# Patient Record
Sex: Female | Born: 1975 | Race: White | Hispanic: No | Marital: Married | Smoking: Never smoker
Health system: Southern US, Community
[De-identification: ages and names within clinical notes are randomized; demographics above are authoritative.]

## PROBLEM LIST (undated history)

## (undated) DIAGNOSIS — D649 Anemia, unspecified: Secondary | ICD-10-CM

## (undated) DIAGNOSIS — T4145XA Adverse effect of unspecified anesthetic, initial encounter: Secondary | ICD-10-CM

## (undated) DIAGNOSIS — R51 Headache: Secondary | ICD-10-CM

## (undated) DIAGNOSIS — Z789 Other specified health status: Secondary | ICD-10-CM

## (undated) DIAGNOSIS — R112 Nausea with vomiting, unspecified: Secondary | ICD-10-CM

## (undated) DIAGNOSIS — F319 Bipolar disorder, unspecified: Secondary | ICD-10-CM

## (undated) DIAGNOSIS — J189 Pneumonia, unspecified organism: Secondary | ICD-10-CM

## (undated) DIAGNOSIS — Z8489 Family history of other specified conditions: Secondary | ICD-10-CM

## (undated) DIAGNOSIS — E079 Disorder of thyroid, unspecified: Secondary | ICD-10-CM

## (undated) DIAGNOSIS — Z862 Personal history of diseases of the blood and blood-forming organs and certain disorders involving the immune mechanism: Secondary | ICD-10-CM

## (undated) DIAGNOSIS — T8859XA Other complications of anesthesia, initial encounter: Secondary | ICD-10-CM

## (undated) DIAGNOSIS — F32A Depression, unspecified: Secondary | ICD-10-CM

## (undated) DIAGNOSIS — T7840XA Allergy, unspecified, initial encounter: Secondary | ICD-10-CM

## (undated) DIAGNOSIS — K219 Gastro-esophageal reflux disease without esophagitis: Secondary | ICD-10-CM

## (undated) DIAGNOSIS — R0600 Dyspnea, unspecified: Secondary | ICD-10-CM

## (undated) DIAGNOSIS — Z9889 Other specified postprocedural states: Secondary | ICD-10-CM

## (undated) DIAGNOSIS — F419 Anxiety disorder, unspecified: Secondary | ICD-10-CM

## (undated) DIAGNOSIS — M5430 Sciatica, unspecified side: Secondary | ICD-10-CM

## (undated) DIAGNOSIS — E039 Hypothyroidism, unspecified: Secondary | ICD-10-CM

## (undated) DIAGNOSIS — G43909 Migraine, unspecified, not intractable, without status migrainosus: Secondary | ICD-10-CM

## (undated) DIAGNOSIS — B019 Varicella without complication: Secondary | ICD-10-CM

## (undated) HISTORY — PX: UPPER GI ENDOSCOPY: SHX6162

## (undated) HISTORY — DX: Allergy, unspecified, initial encounter: T78.40XA

## (undated) HISTORY — DX: Headache: R51

## (undated) HISTORY — PX: OTHER SURGICAL HISTORY: SHX169

## (undated) HISTORY — DX: Depression, unspecified: F32.A

## (undated) HISTORY — DX: Varicella without complication: B01.9

## (undated) HISTORY — PX: WISDOM TOOTH EXTRACTION: SHX21

## (undated) HISTORY — DX: Other specified health status: Z78.9

## (undated) HISTORY — PX: BACK SURGERY: SHX140

## (undated) HISTORY — PX: COLONOSCOPY: SHX174

## (undated) HISTORY — DX: Bipolar disorder, unspecified: F31.9

## (undated) HISTORY — DX: Migraine, unspecified, not intractable, without status migrainosus: G43.909

---
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
## 1998-04-07 ENCOUNTER — Encounter: Payer: Self-pay | Admitting: Obstetrics and Gynecology

## 1998-04-07 ENCOUNTER — Ambulatory Visit (HOSPITAL_COMMUNITY): Admission: RE | Admit: 1998-04-07 | Discharge: 1998-04-07 | Payer: Self-pay | Admitting: Obstetrics and Gynecology

## 1998-05-18 ENCOUNTER — Ambulatory Visit (HOSPITAL_COMMUNITY): Admission: RE | Admit: 1998-05-18 | Discharge: 1998-05-18 | Payer: Self-pay | Admitting: Obstetrics and Gynecology

## 1998-07-05 ENCOUNTER — Inpatient Hospital Stay (HOSPITAL_COMMUNITY): Admission: AD | Admit: 1998-07-05 | Discharge: 1998-07-05 | Payer: Self-pay | Admitting: Obstetrics and Gynecology

## 1998-07-31 ENCOUNTER — Inpatient Hospital Stay (HOSPITAL_COMMUNITY): Admission: AD | Admit: 1998-07-31 | Discharge: 1998-07-31 | Payer: Self-pay | Admitting: Obstetrics and Gynecology

## 1998-08-12 ENCOUNTER — Inpatient Hospital Stay (HOSPITAL_COMMUNITY): Admission: AD | Admit: 1998-08-12 | Discharge: 1998-08-12 | Payer: Self-pay | Admitting: Obstetrics and Gynecology

## 1998-08-19 ENCOUNTER — Inpatient Hospital Stay (HOSPITAL_COMMUNITY): Admission: AD | Admit: 1998-08-19 | Discharge: 1998-08-19 | Payer: Self-pay | Admitting: Obstetrics and Gynecology

## 1998-08-21 ENCOUNTER — Inpatient Hospital Stay (HOSPITAL_COMMUNITY): Admission: AD | Admit: 1998-08-21 | Discharge: 1998-08-25 | Payer: Self-pay | Admitting: *Deleted

## 1999-11-06 ENCOUNTER — Other Ambulatory Visit: Admission: RE | Admit: 1999-11-06 | Discharge: 1999-11-06 | Payer: Self-pay | Admitting: Obstetrics and Gynecology

## 2000-06-11 ENCOUNTER — Ambulatory Visit (HOSPITAL_COMMUNITY): Admission: RE | Admit: 2000-06-11 | Discharge: 2000-06-11 | Payer: Self-pay | Admitting: Obstetrics and Gynecology

## 2000-06-11 ENCOUNTER — Encounter: Payer: Self-pay | Admitting: Obstetrics and Gynecology

## 2000-07-09 ENCOUNTER — Encounter: Payer: Self-pay | Admitting: Obstetrics and Gynecology

## 2000-07-09 ENCOUNTER — Ambulatory Visit (HOSPITAL_COMMUNITY): Admission: RE | Admit: 2000-07-09 | Discharge: 2000-07-09 | Payer: Self-pay | Admitting: Obstetrics and Gynecology

## 2000-11-02 ENCOUNTER — Inpatient Hospital Stay (HOSPITAL_COMMUNITY): Admission: AD | Admit: 2000-11-02 | Discharge: 2000-11-05 | Payer: Self-pay | Admitting: Obstetrics and Gynecology

## 2000-12-15 ENCOUNTER — Other Ambulatory Visit: Admission: RE | Admit: 2000-12-15 | Discharge: 2000-12-15 | Payer: Self-pay | Admitting: Obstetrics and Gynecology

## 2002-12-20 ENCOUNTER — Other Ambulatory Visit: Admission: RE | Admit: 2002-12-20 | Discharge: 2002-12-20 | Payer: Self-pay | Admitting: Obstetrics and Gynecology

## 2003-11-17 ENCOUNTER — Inpatient Hospital Stay (HOSPITAL_COMMUNITY): Admission: RE | Admit: 2003-11-17 | Discharge: 2003-11-19 | Payer: Self-pay | Admitting: Obstetrics and Gynecology

## 2003-12-27 ENCOUNTER — Other Ambulatory Visit: Admission: RE | Admit: 2003-12-27 | Discharge: 2003-12-27 | Payer: Self-pay | Admitting: Obstetrics and Gynecology

## 2005-01-21 ENCOUNTER — Other Ambulatory Visit: Admission: RE | Admit: 2005-01-21 | Discharge: 2005-01-21 | Payer: Self-pay | Admitting: Obstetrics and Gynecology

## 2006-01-22 ENCOUNTER — Other Ambulatory Visit: Admission: RE | Admit: 2006-01-22 | Discharge: 2006-01-22 | Payer: Self-pay | Admitting: Obstetrics and Gynecology

## 2011-11-21 ENCOUNTER — Other Ambulatory Visit: Payer: Self-pay | Admitting: Family Medicine

## 2011-11-21 DIAGNOSIS — J329 Chronic sinusitis, unspecified: Secondary | ICD-10-CM

## 2011-12-02 ENCOUNTER — Inpatient Hospital Stay: Admission: RE | Admit: 2011-12-02 | Payer: Self-pay | Source: Ambulatory Visit

## 2014-01-24 ENCOUNTER — Ambulatory Visit (INDEPENDENT_AMBULATORY_CARE_PROVIDER_SITE_OTHER): Payer: BC Managed Care – PPO | Admitting: Neurology

## 2014-01-24 ENCOUNTER — Encounter: Payer: Self-pay | Admitting: Neurology

## 2014-01-24 ENCOUNTER — Ambulatory Visit: Payer: Self-pay | Admitting: Neurology

## 2014-01-24 VITALS — BP 118/74 | HR 70 | Temp 99.1°F | Resp 16 | Ht 64.5 in | Wt 183.5 lb

## 2014-01-24 DIAGNOSIS — G43839 Menstrual migraine, intractable, without status migrainosus: Secondary | ICD-10-CM

## 2014-01-24 MED ORDER — TOPIRAMATE 25 MG PO TABS: 25.0000 mg | ORAL_TABLET | Freq: Every day | ORAL | Status: DC

## 2014-01-24 NOTE — Patient Instructions (Signed)
Migraine Recommendations: 1.  Take Relpax 40mg  at immediate onset of migraine.  May repeat once in 2 hours (do not exceed 2 pills in 24 hours) 2.  Limit use of pain relievers to no more than 2 days out of the week.  These medications include acetaminophen, ibuprofen, triptans and narcotics.  This will help reduce risk of rebound headaches. 3.  Keep a headache diary. 4.  Stay adequately hydrated. 5.  Maintain good sleep hygiene. 6.  Maintain proper stress management. 7.  Start topamax 25mg  at bedtime.  Possible side effects include: impaired thinking, sedation, paresthesias (numbness and tingling) and weight loss.  It may cause dehydration and there is a small risk for kidney stones, so make sure to stay hydrated with water during the day.  There is also a very small risk for glaucoma, so if you notice any change in your vision while taking this medication, see an ophthalmologist.   8.  Call in 4 weeks with update.  Follow up in 3 months.

## 2014-01-24 NOTE — Progress Notes (Signed)
NEUROLOGY CONSULTATION NOTE  Sheila Mathews MRN: [id number] DOB: 12-05-1975  Referring provider: Dr. Hyman Hopes Primary care provider: Dr. Hyman Hopes  Reason for consult:  Migraine  HISTORY OF PRESENT ILLNESS: Sheila Mathews is a 38 year old right-handed woman history of migraines, panic attacks, anxiety and allergic rhinitis who presents for migraine.  Onset:  38 years old Location:  Usually right-sided, retro-orbital, and moves to the top of the head Quality:  Usually pressure-like, sometimes stabbing Intensity:  Usually 6-7/10, 10 out of 10 for severe Aura:  No Prodrome:  No Associated symptoms:  First severe, experiences nausea, blurred vision, photophobia, phonophobia, and osmophobia. Duration:  Severe attacks last up to 4 days. Otherwise 2-3 hours Frequency:  Severe attacks occur once a month (4 days per month), otherwise other headaches occur 2 days per week. Total of 15-18 headache days per month. Triggers/exacerbating factors:  Menstrual cycle Relieving factors:  Phenergan/Toradol shots Activity:  Cannot function with severe attacks  Past abortive therapy:  Maxalt 10mg , Imitrex 50mg  (ineffective), ketorolac tromethamine 60mg , Excedrin (ineffective), ibuprofen and naproxen (made headaches worse) Past preventative therapy:  amitriptyline 10mg  (side effects), Depakote (did not tolerate),  Not started on propranolol due to HR in 60s  Current abortive therapy:  Relpax 40mg  (so far it helps) Current preventative therapy:  None Other medications:  alprazolam 0.25mg , citalopram 40mg , omeprazole 20mg   Caffeine:  1 cup of coffee per day Alcohol:  Occasionally Smoker:  No Diet:  8 healthy. Drinks a lot of water. Exercise:  Good Depression/stress:  Anxiety controlled Sleep hygiene:  Wakes up during the night and difficulty falling back asleep Family history of headache:  No  PAST MEDICAL HISTORY: Past Medical History  Diagnosis Date  . Headache(784.0)     PAST SURGICAL  HISTORY: No past surgical history on file.  MEDICATIONS: No current outpatient prescriptions on file prior to visit.   No current facility-administered medications on file prior to visit.    ALLERGIES: Allergies  Allergen Reactions  . Codeine     Makes patient aggressive     FAMILY HISTORY: Family History  Problem Relation Age of Onset  . Cancer Maternal Grandmother     breast   . Cancer Maternal Grandfather     head neck   . Cancer Paternal Grandmother     gallbladder  . Cancer Paternal Grandfather     colon   . Diabetes Paternal Grandfather   . Diabetes Maternal Grandfather     SOCIAL HISTORY: History   Social History  . Marital Status: Married    Spouse Name: N/A    Number of Children: N/A  . Years of Education: N/A   Occupational History  . Not on file.   Social History Main Topics  . Smoking status: Not on file  . Smokeless tobacco: Not on file  . Alcohol Use: Yes     Comment: social  . Drug Use: No  . Sexual Activity: Yes    Partners: Male   Other Topics Concern  . Not on file   Social History Narrative  . No narrative on file    REVIEW OF SYSTEMS: Constitutional: No fevers, chills, or sweats, no generalized fatigue, change in appetite Eyes: No visual changes, double vision, eye pain Ear, nose and throat: No hearing loss, ear pain, nasal congestion, sore throat Cardiovascular: No chest pain, palpitations Respiratory:  No shortness of breath at rest or with exertion, wheezes GastrointestinaI: No nausea, vomiting, diarrhea, abdominal pain, fecal incontinence Genitourinary:  No dysuria, urinary  retention or frequency Musculoskeletal:  No neck pain, back pain Integumentary: No rash, pruritus, skin lesions Neurological: as above Psychiatric: No depression, insomnia, anxiety Endocrine: No palpitations, fatigue, diaphoresis, mood swings, change in appetite, change in weight, increased thirst Hematologic/Lymphatic:  No anemia, purpura,  petechiae. Allergic/Immunologic: no itchy/runny eyes, nasal congestion, recent allergic reactions, rashes  PHYSICAL EXAM: Filed Vitals:   01/24/14 1009  BP: 118/74  Pulse: 70  Temp: 99.1 F (37.3 C)  Resp: 16   General: No acute distress Head:  Normocephalic/atraumatic Neck: supple, no paraspinal tenderness, full range of motion Back: No paraspinal tenderness Heart: regular rate and rhythm Lungs: Clear to auscultation bilaterally. Vascular: No carotid bruits. Neurological Exam: Mental status: alert and oriented to person, place, and time, recent and remote memory intact, fund of knowledge intact, attention and concentration intact, speech fluent and not dysarthric, language intact. Cranial nerves: CN I: not tested CN II: pupils equal, round and reactive to light, visual fields intact, fundi unremarkable, without vessel changes, exudates, hemorrhages or papilledema. CN III, IV, VI:  full range of motion, no nystagmus, no ptosis CN V: facial sensation intact CN VII: upper and lower face symmetric CN VIII: hearing intact CN IX, X: gag intact, uvula midline CN XI: sternocleidomastoid and trapezius muscles intact CN XII: tongue midline Bulk & Tone: normal, no fasciculations. Motor: 5 out of 5 throughout Sensation: Temperature and vibration intact Deep Tendon Reflexes: 2+ throughout, toes downgoing Finger to nose testing: No dysmetria Heel to shin: No dysmetria Gait: Normal station and stride. Able to turn and walk in tandem. Romberg negative.  IMPRESSION: Menstrually related migraines  PLAN: 1.  start Topamax 25 mg at bedtime. Side effects discussed. Instructed to call in 4 weeks with update and we can adjust dose as needed. 2. For abortive therapy, continue Relpax 40 mg. 3. Followup in 3 months. Other options are to consider a mini prophylaxis during her cycle.  Thank you for allowing me to take part in the care of this patient.  Shon MilletAdam Jaffe, DO  CC: Shirlean Mylararol Webb,  MD

## 2014-05-02 ENCOUNTER — Telehealth: Payer: Self-pay | Admitting: Neurology

## 2014-05-02 NOTE — Telephone Encounter (Signed)
Pt canceled her f/u appt on 05/04/14 due to her having a meeting after work. Pt will call later to r/s.

## 2014-05-04 ENCOUNTER — Ambulatory Visit: Payer: BC Managed Care – PPO | Admitting: Neurology

## 2014-05-04 ENCOUNTER — Encounter (HOSPITAL_COMMUNITY): Payer: Self-pay

## 2014-05-04 ENCOUNTER — Encounter (HOSPITAL_COMMUNITY)
Admission: RE | Admit: 2014-05-04 | Discharge: 2014-05-04 | Disposition: A | Discharge status: Home / Self Care | Payer: BC Managed Care – PPO | Source: Ambulatory Visit | Attending: Obstetrics and Gynecology | Admitting: Obstetrics and Gynecology

## 2014-05-04 HISTORY — DX: Gastro-esophageal reflux disease without esophagitis: K21.9

## 2014-05-04 HISTORY — DX: Pneumonia, unspecified organism: J18.9

## 2014-05-04 HISTORY — DX: Anxiety disorder, unspecified: F41.9

## 2014-05-04 HISTORY — DX: Other complications of anesthesia, initial encounter: T88.59XA

## 2014-05-04 HISTORY — DX: Adverse effect of unspecified anesthetic, initial encounter: T41.45XA

## 2014-05-04 LAB — CBC
HCT: 36.2 % (ref 36.0–46.0)
Hemoglobin: 12.5 g/dL (ref 12.0–15.0)
MCH: 32 pg (ref 26.0–34.0)
MCHC: 34.5 g/dL (ref 30.0–36.0)
MCV: 92.6 fL (ref 78.0–100.0)
PLATELETS: 315 10*3/uL (ref 150–400)
RBC: 3.91 MIL/uL (ref 3.87–5.11)
RDW: 13.3 % (ref 11.5–15.5)
WBC: 10.1 10*3/uL (ref 4.0–10.5)

## 2014-05-04 NOTE — Patient Instructions (Addendum)
Your procedure is scheduled on:  Friday, NOV. 13, 2015  Enter through the Main Entrance of [facility] at: 6:00 A.M. Pick up the phone at the desk and dial [phone].  Call this number if you have problems the morning of surgery: [phone].  Remember: Do NOT eat food:  AFTER MIDNIGHT THURSDAY Do NOT drink clear liquids after: AFTER MIDNIGHT THURSDAY Take these medicines the morning of surgery with a SIP OF WATER: Prilosec  Do NOT wear jewelry (body piercing), metal hair clips/bobby pins, make-up, or nail polish. Do NOT wear lotions, powders, or perfumes.  You may wear deoderant. Do NOT shave for 48 hours prior to surgery. Do NOT bring valuables to the hospital. Contacts, dentures, or bridgework may not be worn into surgery.  Have a responsible adult drive you home and stay with you for 24 hours after your procedure

## 2014-05-12 ENCOUNTER — Encounter (HOSPITAL_COMMUNITY): Payer: Self-pay | Admitting: Anesthesiology

## 2014-05-12 NOTE — H&P (Signed)
Sheila Mathews is an 38 y.o. female. For the past 1-2 months has had constant, dull, bilateral low abdominal/pelvic pain.  At it's worse it is 7/10 intensity.  Pain is worse with menses and with coitus, better when has BM and with NSAIDs.  She is having regular meses monthly with OCP.  Pelvic ultrasound is normal.  Medical and surgical options have been discussed, she wishes to proceed with laparoscopy.    Pertinent Gynecological History: Last pap: abnormal: Normal with +HPV, normal colposcopy Date: 07/2013 OB History: G3, P3003 C-section, then VBAC, then c-section   Menstrual History: No LMP recorded.    Past Medical History  Diagnosis Date  . Headache(784.0)   . Pneumonia   . Anxiety   . GERD (gastroesophageal reflux disease)   . Complication of anesthesia     pt doesn't like mask she has panic attack    Past Surgical History  Procedure Laterality Date  . Cesarean section    . Saliva gland removal    . Upper gi endoscopy    . Wisdom tooth extraction      Family History  Problem Relation Age of Onset  . Cancer Maternal Grandmother     breast   . Cancer Maternal Grandfather     head neck   . Cancer Paternal Grandmother     gallbladder  . Cancer Paternal Grandfather     colon   . Diabetes Paternal Grandfather   . Diabetes Maternal Grandfather     Social History:  reports that she has never smoked. She has never used smokeless tobacco. She reports that she drinks alcohol. She reports that she does not use illicit drugs.  Allergies:  Allergies  Allergen Reactions  . Codeine     Makes patient aggressive     No prescriptions prior to admission    Review of Systems  Respiratory: Negative.   Cardiovascular: Negative.   Gastrointestinal: Positive for diarrhea and constipation.  Genitourinary: Positive for urgency and frequency.    There were no vitals taken for this visit. Physical Exam  Constitutional: She appears well-developed and well-nourished.  Neck:  Neck supple. No thyromegaly present.  Cardiovascular: Normal rate, regular rhythm and normal heart sounds.   No murmur heard. Respiratory: Effort normal and breath sounds normal. No respiratory distress. She has no wheezes.  GI: Soft. She exhibits no distension and no mass. Tenderness: mild BLQ   Genitourinary:  Tender left vaginal fornix Uterus normal size, non-tender No adnexal mass, bilateral adnexal tenderness    No results found for this or any previous visit (from the past 24 hour(s)).  No results found.  Assessment/Plan: Increasing pelvic pain.  Medical and surgical options discussed, she wishes to proceed with laparoscopy.  Surgical procedure and risks, as well as chances of finding a cause and relieving pain have all been discussed.  Will proceed with diagnostic/possible operative laparoscopy.  Tylah Mancillas D 05/12/2014, 8:13 PM

## 2014-05-12 NOTE — Anesthesia Preprocedure Evaluation (Addendum)
Anesthesia Evaluation  Patient identified by MRN, date of birth, ID band Patient awake    Reviewed: Allergy & Precautions, H&P , NPO status , Patient's Chart, lab work & pertinent test results  History of Anesthesia Complications (+) history of anesthetic complications  Airway Mallampati: II  TM Distance: >3 FB Neck ROM: Full    Dental no notable dental hx. (+) Teeth Intact   Pulmonary pneumonia -, resolved,  breath sounds clear to auscultation  Pulmonary exam normal       Cardiovascular negative cardio ROS  Rhythm:Regular Rate:Normal     Neuro/Psych  Headaches, PSYCHIATRIC DISORDERS Anxiety    GI/Hepatic Neg liver ROS, GERD-  Medicated and Controlled,  Endo/Other  Obesity  Renal/GU   negative genitourinary   Musculoskeletal negative musculoskeletal ROS (+)   Abdominal (+) + obese,   Peds  Hematology negative hematology ROS (+)   Anesthesia Other Findings   Reproductive/Obstetrics Pelvic pain                             Anesthesia Physical Anesthesia Plan  ASA: II  Anesthesia Plan: General   Post-op Pain Management:    Induction: Intravenous and Cricoid pressure planned  Airway Management Planned: Oral ETT  Additional Equipment:   Intra-op Plan:   Post-operative Plan: Extubation in OR  Informed Consent: I have reviewed the patients History and Physical, chart, labs and discussed the procedure including the risks, benefits and alternatives for the proposed anesthesia with the patient or authorized representative who has indicated his/her understanding and acceptance.   Dental advisory given  Plan Discussed with: CRNA, Anesthesiologist and Surgeon  Anesthesia Plan Comments:         Anesthesia Quick Evaluation

## 2014-05-13 ENCOUNTER — Ambulatory Visit (HOSPITAL_COMMUNITY): Payer: BC Managed Care – PPO | Admitting: Anesthesiology

## 2014-05-13 ENCOUNTER — Encounter (HOSPITAL_COMMUNITY): Payer: Self-pay | Admitting: Anesthesiology

## 2014-05-13 ENCOUNTER — Ambulatory Visit (HOSPITAL_COMMUNITY)
Admission: RE | Admit: 2014-05-13 | Discharge: 2014-05-13 | Disposition: A | Discharge status: Home / Self Care | Payer: BC Managed Care – PPO | Source: Ambulatory Visit | Attending: Obstetrics and Gynecology | Admitting: Obstetrics and Gynecology

## 2014-05-13 ENCOUNTER — Encounter (HOSPITAL_COMMUNITY)
Admission: RE | Disposition: A | Discharge status: Home / Self Care | Payer: Self-pay | Source: Ambulatory Visit | Attending: Obstetrics and Gynecology

## 2014-05-13 DIAGNOSIS — Z6830 Body mass index (BMI) 30.0-30.9, adult: Secondary | ICD-10-CM | POA: Insufficient documentation

## 2014-05-13 DIAGNOSIS — K219 Gastro-esophageal reflux disease without esophagitis: Secondary | ICD-10-CM | POA: Diagnosis not present

## 2014-05-13 DIAGNOSIS — E669 Obesity, unspecified: Secondary | ICD-10-CM | POA: Diagnosis not present

## 2014-05-13 DIAGNOSIS — R102 Pelvic and perineal pain: Secondary | ICD-10-CM

## 2014-05-13 DIAGNOSIS — N949 Unspecified condition associated with female genital organs and menstrual cycle: Secondary | ICD-10-CM | POA: Insufficient documentation

## 2014-05-13 HISTORY — DX: Pelvic and perineal pain: R10.2

## 2014-05-13 HISTORY — PX: LAPAROSCOPY: SHX197

## 2014-05-13 LAB — PREGNANCY, URINE: PREG TEST UR: NEGATIVE

## 2014-05-13 SURGERY — LAPAROSCOPY, DIAGNOSTIC
Anesthesia: General

## 2014-05-13 MED ORDER — METOCLOPRAMIDE HCL 5 MG/ML IJ SOLN: 10.0000 mg | Freq: Once | INTRAMUSCULAR | Status: DC | PRN

## 2014-05-13 MED ORDER — SCOPOLAMINE 1 MG/3DAYS TD PT72
1.0000 | MEDICATED_PATCH | TRANSDERMAL | Status: DC
  Administered 2014-05-13: 1.5 mg via TRANSDERMAL

## 2014-05-13 MED ORDER — ROCURONIUM BROMIDE 100 MG/10ML IV SOLN
INTRAVENOUS | Status: DC | PRN
  Administered 2014-05-13: 30 mg via INTRAVENOUS

## 2014-05-13 MED ORDER — MIDAZOLAM HCL 2 MG/2ML IJ SOLN
INTRAMUSCULAR | Status: AC
  Filled 2014-05-13: qty 2

## 2014-05-13 MED ORDER — GLYCOPYRROLATE 0.2 MG/ML IJ SOLN
INTRAMUSCULAR | Status: DC | PRN
  Administered 2014-05-13: 0.4 mg via INTRAVENOUS

## 2014-05-13 MED ORDER — PROPOFOL 10 MG/ML IV BOLUS
INTRAVENOUS | Status: DC | PRN
  Administered 2014-05-13: 180 mg via INTRAVENOUS

## 2014-05-13 MED ORDER — SCOPOLAMINE 1 MG/3DAYS TD PT72: 1.0000 | MEDICATED_PATCH | Freq: Once | TRANSDERMAL | Status: DC

## 2014-05-13 MED ORDER — DEXAMETHASONE SODIUM PHOSPHATE 10 MG/ML IJ SOLN
INTRAMUSCULAR | Status: DC | PRN
  Administered 2014-05-13: 4 mg via INTRAVENOUS

## 2014-05-13 MED ORDER — ROCURONIUM BROMIDE 100 MG/10ML IV SOLN
INTRAVENOUS | Status: AC
  Filled 2014-05-13: qty 1

## 2014-05-13 MED ORDER — LIDOCAINE HCL (CARDIAC) 20 MG/ML IV SOLN
INTRAVENOUS | Status: DC | PRN
  Administered 2014-05-13: 20 mg via INTRAVENOUS
  Administered 2014-05-13: 80 mg via INTRAVENOUS

## 2014-05-13 MED ORDER — OXYCODONE-ACETAMINOPHEN 5-325 MG PO TABS: 1.0000 | ORAL_TABLET | ORAL | Status: DC | PRN

## 2014-05-13 MED ORDER — KETOROLAC TROMETHAMINE 30 MG/ML IJ SOLN
INTRAMUSCULAR | Status: AC
  Filled 2014-05-13: qty 1

## 2014-05-13 MED ORDER — MIDAZOLAM HCL 2 MG/2ML IJ SOLN
INTRAMUSCULAR | Status: DC | PRN
  Administered 2014-05-13: 1 mg via INTRAVENOUS

## 2014-05-13 MED ORDER — OXYCODONE-ACETAMINOPHEN 5-325 MG PO TABS
ORAL_TABLET | ORAL | Status: AC
  Administered 2014-05-13: 1
  Filled 2014-05-13: qty 1

## 2014-05-13 MED ORDER — PROPOFOL 10 MG/ML IV EMUL
INTRAVENOUS | Status: AC
  Filled 2014-05-13: qty 20

## 2014-05-13 MED ORDER — NEOSTIGMINE METHYLSULFATE 10 MG/10ML IV SOLN
INTRAVENOUS | Status: DC | PRN
  Administered 2014-05-13: 3 mg via INTRAVENOUS

## 2014-05-13 MED ORDER — HYDROCODONE-ACETAMINOPHEN 5-325 MG PO TABS
1.0000 | ORAL_TABLET | ORAL | Status: DC | PRN
Start: 2014-05-13 — End: 2014-05-13

## 2014-05-13 MED ORDER — FENTANYL CITRATE 0.05 MG/ML IJ SOLN
25.0000 ug | INTRAMUSCULAR | Status: DC | PRN
  Administered 2014-05-13 (×2): 50 ug via INTRAVENOUS

## 2014-05-13 MED ORDER — LACTATED RINGERS IV SOLN
INTRAVENOUS | Status: DC
  Administered 2014-05-13 (×2): via INTRAVENOUS

## 2014-05-13 MED ORDER — ONDANSETRON HCL 4 MG/2ML IJ SOLN
INTRAMUSCULAR | Status: AC
  Filled 2014-05-13: qty 2

## 2014-05-13 MED ORDER — KETOROLAC TROMETHAMINE 30 MG/ML IJ SOLN
INTRAMUSCULAR | Status: DC | PRN
  Administered 2014-05-13: 30 mg via INTRAVENOUS

## 2014-05-13 MED ORDER — FENTANYL CITRATE 0.05 MG/ML IJ SOLN
INTRAMUSCULAR | Status: AC
  Administered 2014-05-13: 50 ug via INTRAVENOUS
  Filled 2014-05-13: qty 2

## 2014-05-13 MED ORDER — LIDOCAINE HCL (CARDIAC) 20 MG/ML IV SOLN
INTRAVENOUS | Status: AC
  Filled 2014-05-13: qty 5

## 2014-05-13 MED ORDER — DEXAMETHASONE SODIUM PHOSPHATE 4 MG/ML IJ SOLN
INTRAMUSCULAR | Status: AC
  Filled 2014-05-13: qty 1

## 2014-05-13 MED ORDER — SODIUM CHLORIDE 0.9 % IJ SOLN
INTRAMUSCULAR | Status: DC | PRN
  Administered 2014-05-13: 3 mL via INTRAVENOUS

## 2014-05-13 MED ORDER — NEOSTIGMINE METHYLSULFATE 10 MG/10ML IV SOLN
INTRAVENOUS | Status: AC
  Filled 2014-05-13: qty 1

## 2014-05-13 MED ORDER — FENTANYL CITRATE 0.05 MG/ML IJ SOLN
INTRAMUSCULAR | Status: AC
  Filled 2014-05-13: qty 5

## 2014-05-13 MED ORDER — SCOPOLAMINE 1 MG/3DAYS TD PT72
MEDICATED_PATCH | TRANSDERMAL | Status: AC
  Administered 2014-05-13: 1.5 mg via TRANSDERMAL
  Filled 2014-05-13: qty 1

## 2014-05-13 MED ORDER — BUPIVACAINE HCL (PF) 0.25 % IJ SOLN
INTRAMUSCULAR | Status: DC | PRN
  Administered 2014-05-13: 10 mL

## 2014-05-13 MED ORDER — GLYCOPYRROLATE 0.2 MG/ML IJ SOLN
INTRAMUSCULAR | Status: AC
  Filled 2014-05-13: qty 3

## 2014-05-13 MED ORDER — BUPIVACAINE HCL (PF) 0.25 % IJ SOLN
INTRAMUSCULAR | Status: AC
  Filled 2014-05-13: qty 30

## 2014-05-13 MED ORDER — MEPERIDINE HCL 25 MG/ML IJ SOLN: 6.2500 mg | INTRAMUSCULAR | Status: DC | PRN

## 2014-05-13 MED ORDER — ONDANSETRON HCL 4 MG/2ML IJ SOLN
INTRAMUSCULAR | Status: DC | PRN
  Administered 2014-05-13: 4 mg via INTRAVENOUS

## 2014-05-13 MED ORDER — FENTANYL CITRATE 0.05 MG/ML IJ SOLN
INTRAMUSCULAR | Status: DC | PRN
  Administered 2014-05-13 (×2): 50 ug via INTRAVENOUS

## 2014-05-13 SURGICAL SUPPLY — 34 items
BLADE SURG 11 STRL SS (BLADE) ×2 IMPLANT
CABLE HIGH FREQUENCY MONO STRZ (ELECTRODE) IMPLANT
CATH FOLEY 2WAY  3CC 10FR (CATHETERS)
CATH FOLEY 2WAY 3CC 10FR (CATHETERS) IMPLANT
CATH ROBINSON RED A/P 16FR (CATHETERS) ×2 IMPLANT
CHLORAPREP W/TINT 26ML (MISCELLANEOUS) ×2 IMPLANT
CLOTH BEACON ORANGE TIMEOUT ST (SAFETY) ×2 IMPLANT
DECANTER SPIKE VIAL GLASS SM (MISCELLANEOUS) ×2 IMPLANT
DRSG COVADERM PLUS 2X2 (GAUZE/BANDAGES/DRESSINGS) ×4 IMPLANT
DRSG OPSITE POSTOP 3X4 (GAUZE/BANDAGES/DRESSINGS) ×2 IMPLANT
GLOVE BIO SURGEON STRL SZ8 (GLOVE) ×2 IMPLANT
GLOVE ORTHO TXT STRL SZ7.5 (GLOVE) ×2 IMPLANT
GOWN STRL REUS W/TWL 2XL LVL3 (GOWN DISPOSABLE) ×2 IMPLANT
GOWN STRL REUS W/TWL LRG LVL3 (GOWN DISPOSABLE) ×4 IMPLANT
LIQUID BAND (GAUZE/BANDAGES/DRESSINGS) ×2 IMPLANT
NEEDLE EPID 17G 5 ECHO TUOHY (NEEDLE) IMPLANT
NEEDLE INSUFFLATION 120MM (ENDOMECHANICALS) ×2 IMPLANT
NS IRRIG 1000ML POUR BTL (IV SOLUTION) ×2 IMPLANT
PACK LAPAROSCOPY BASIN (CUSTOM PROCEDURE TRAY) ×2 IMPLANT
PAD TRENDELENBURG OR TABLE (MISCELLANEOUS) ×2 IMPLANT
POUCH SPECIMEN RETRIEVAL 10MM (ENDOMECHANICALS) IMPLANT
PROTECTOR NERVE ULNAR (MISCELLANEOUS) ×2 IMPLANT
SET IRRIG TUBING LAPAROSCOPIC (IRRIGATION / IRRIGATOR) IMPLANT
SHEARS HARMONIC ACE PLUS 36CM (ENDOMECHANICALS) IMPLANT
SLEEVE XCEL OPT CAN 5 100 (ENDOMECHANICALS) IMPLANT
SOLUTION ELECTROLUBE (MISCELLANEOUS) IMPLANT
SUT VICRYL 0 UR6 27IN ABS (SUTURE) IMPLANT
SUT VICRYL 4-0 PS2 18IN ABS (SUTURE) ×2 IMPLANT
TOWEL OR 17X24 6PK STRL BLUE (TOWEL DISPOSABLE) ×4 IMPLANT
TRAY FOLEY CATH 14FR (SET/KITS/TRAYS/PACK) IMPLANT
TROCAR XCEL NON-BLD 11X100MML (ENDOMECHANICALS) IMPLANT
TROCAR XCEL NON-BLD 5MMX100MML (ENDOMECHANICALS) ×2 IMPLANT
WARMER LAPAROSCOPE (MISCELLANEOUS) ×2 IMPLANT
WATER STERILE IRR 1000ML POUR (IV SOLUTION) ×2 IMPLANT

## 2014-05-13 NOTE — Anesthesia Postprocedure Evaluation (Signed)
  Anesthesia Post-op Note  Anesthesia Post Note  Patient: [name]  Procedure(s) Performed: Procedure(s) (LRB): LAPAROSCOPY DIAGNOSTIC (N/A)  Anesthesia type: General  Patient location: PACU  Post pain: Pain level controlled  Post assessment: Post-op Vital signs reviewed  Last Vitals:  Filed Vitals:   05/13/14 0915  BP: 101/59  Pulse: 69  Temp:   Resp: 36    Post vital signs: Reviewed  Level of consciousness: sedated  Complications: No apparent anesthesia complications

## 2014-05-13 NOTE — Discharge Instructions (Signed)
Routine instructions for laparoscopyDISCHARGE INSTRUCTIONS: Laparoscopy ° °The following instructions have been prepared to help you care for yourself upon your return home today. ° °Wound care: °• Do not get the incision wet for the first 24 hours. The incision should be kept clean and dry. °• The Band-Aids or dressings may be removed the day after surgery. °• Should the incision become sore, red, and swollen after the first week, check with your doctor. ° °Personal hygiene: °• Shower the day after your procedure. ° °Activity and limitations: °• Do NOT drive or operate any equipment today. °• Do NOT lift anything more than 15 pounds for 2-3 weeks after surgery. °• Do NOT rest in bed all day. °• Walking is encouraged. Walk each day, starting slowly with 5-minute walks 3 or 4 times a day. Slowly increase the length of your walks. °• Walk up and down stairs slowly. °• Do NOT do strenuous activities, such as golfing, playing tennis, bowling, running, biking, weight lifting, gardening, mowing, or vacuuming for 2-4 weeks. Ask your doctor when it is okay to start. ° °Diet: Eat a light meal as desired this evening. You may resume your usual diet tomorrow. ° °Return to work: This is dependent on the type of work you do. For the most part you can return to a desk job within a week of surgery. If you are more active at work, please discuss this with your doctor. ° °What to expect after your surgery: You may have a slight burning sensation when you urinate on the first day. You may have a very small amount of blood in the urine. Expect to have a small amount of vaginal discharge/light bleeding for 1-2 weeks. It is not unusual to have abdominal soreness and bruising for up to 2 weeks. You may be tired and need more rest for about 1 week. You may experience shoulder pain for 24-72 hours. Lying flat in bed may relieve it. ° °Call your doctor for any of the following: °• Develop a fever of 100.4 or greater °• Inability to urinate  6 hours after discharge from hospital °• Severe pain not relieved by pain medications °• Persistent of heavy bleeding at incision site °• Redness or swelling around incision site after a week °• Increasing nausea or vomiting ° °Patient Signature________________________________________ °Nurse Signature_________________________________________ °

## 2014-05-13 NOTE — Op Note (Signed)
Preoperative diagnosis: Pelvic pain  Postoperative diagnosis: Same  Procedure: Diagnostic laparoscopy  Surgeon: Lavina Hammanodd Nija Koopman M.D.  Anesthesia: Gen. Endotracheal tube  Findings: She had a normal abdomen and pelvis with normal uterus tubes and ovaries Specimens: None  Estimated blood loss: Minimal  Complications: None   Procedure in detail:  The patient was taken to the operating room and placed in the dorsosupine position. General anesthesia was induced. Her legs were placed in mobile stirrups and her left arm was tucked to her side. Abdomen perineum and vagina were then prepped and draped in the usual sterile fashion, bladder drained with a red Robinson catheter, a Hulka tenaculum was applied to the cervix for uterine manipulation. Infraumbilical skin was then infiltrated with quarter percent Marcaine and a 1 cm vertical incision was made. The veress needle was inserted into the peritoneal cavity and placement confirmed by the water drop test and an opening pressure of 6 mm of mercury. CO2 was insufflated to a pressure of 12 mm of mercury and the veress needle was removed. A 5mm disposable trocar was then introduced with direct visualization with the laparoscope. A 5 mm port was then placed on the left side also under direct visualization. Careful and thorough inspection revealed the above-mentioned findings with normal anatomy, no source for her pain was identified. The 5 mm trocar on the left was removed under direct visualization.  All gas was allowed to deflate from the abdomen and the umbilical trocar was removed. Skin incisions were then closed with interrupted subcuticular sutures of 4-0 Vicryl followed by Dermabond. The Hulka tenaculum was removed. The patient was taken down from stirrups. She was awakened in the operating room and taken to the recovery room in stable condition after tolerating the procedure well. Counts were correct and she had PAS hose on throughout the procedure.

## 2014-05-13 NOTE — Interval H&P Note (Signed)
History and Physical Interval Note:  05/13/2014 7:07 AM  Sheila Mathews  has presented today for surgery, with the diagnosis of Pelvic pain  The various methods of treatment have been discussed with the patient and family. After consideration of risks, benefits and other options for treatment, the patient has consented to  Procedure(s): LAPAROSCOPY DIAGNOSTIC (N/A) LAPAROSCOPY OPERATIVE (N/A) as a surgical intervention .  The patient's history has been reviewed, patient examined, no change in status, stable for surgery.  I have reviewed the patient's chart and labs.  Questions were answered to the patient's satisfaction.     Shereese Bonnie D

## 2014-05-13 NOTE — Transfer of Care (Signed)
Immediate Anesthesia Transfer of Care Note  Patient: Sheila CharonKristi N Mathews  Procedure(s) Performed: Procedure(s): LAPAROSCOPY DIAGNOSTIC (N/A)  Patient Location: PACU  Anesthesia Type:General  Level of Consciousness: awake, alert , oriented and patient cooperative  Airway & Oxygen Therapy: Patient Spontanous Breathing and Patient connected to nasal cannula oxygen  Post-op Assessment: Report given to PACU RN and Post -op Vital signs reviewed and stable  Post vital signs: Reviewed and stable  Complications: No apparent anesthesia complications

## 2014-05-16 ENCOUNTER — Encounter (HOSPITAL_COMMUNITY): Payer: Self-pay | Admitting: Obstetrics and Gynecology

## 2015-02-15 ENCOUNTER — Telehealth: Payer: Self-pay | Admitting: Certified Nurse Midwife

## 2015-02-15 NOTE — Telephone Encounter (Signed)
Left patient a message to call back when ready to reschedule, canceled by automated reminder call. °

## 2015-02-17 ENCOUNTER — Encounter: Payer: Self-pay | Admitting: Certified Nurse Midwife

## 2016-01-25 DIAGNOSIS — F419 Anxiety disorder, unspecified: Secondary | ICD-10-CM | POA: Diagnosis not present

## 2016-01-25 DIAGNOSIS — R41840 Attention and concentration deficit: Secondary | ICD-10-CM | POA: Diagnosis not present

## 2016-02-12 DIAGNOSIS — M9903 Segmental and somatic dysfunction of lumbar region: Secondary | ICD-10-CM | POA: Diagnosis not present

## 2016-02-12 DIAGNOSIS — M5432 Sciatica, left side: Secondary | ICD-10-CM | POA: Diagnosis not present

## 2016-02-13 DIAGNOSIS — M9903 Segmental and somatic dysfunction of lumbar region: Secondary | ICD-10-CM | POA: Diagnosis not present

## 2016-02-13 DIAGNOSIS — M5432 Sciatica, left side: Secondary | ICD-10-CM | POA: Diagnosis not present

## 2016-02-15 DIAGNOSIS — M5432 Sciatica, left side: Secondary | ICD-10-CM | POA: Diagnosis not present

## 2016-02-15 DIAGNOSIS — M9903 Segmental and somatic dysfunction of lumbar region: Secondary | ICD-10-CM | POA: Diagnosis not present

## 2016-02-19 DIAGNOSIS — M5432 Sciatica, left side: Secondary | ICD-10-CM | POA: Diagnosis not present

## 2016-02-19 DIAGNOSIS — M9903 Segmental and somatic dysfunction of lumbar region: Secondary | ICD-10-CM | POA: Diagnosis not present

## 2016-02-20 DIAGNOSIS — M5432 Sciatica, left side: Secondary | ICD-10-CM | POA: Diagnosis not present

## 2016-02-20 DIAGNOSIS — M9903 Segmental and somatic dysfunction of lumbar region: Secondary | ICD-10-CM | POA: Diagnosis not present

## 2016-02-22 DIAGNOSIS — M9903 Segmental and somatic dysfunction of lumbar region: Secondary | ICD-10-CM | POA: Diagnosis not present

## 2016-02-22 DIAGNOSIS — M5432 Sciatica, left side: Secondary | ICD-10-CM | POA: Diagnosis not present

## 2016-02-26 DIAGNOSIS — M5432 Sciatica, left side: Secondary | ICD-10-CM | POA: Diagnosis not present

## 2016-02-26 DIAGNOSIS — M9903 Segmental and somatic dysfunction of lumbar region: Secondary | ICD-10-CM | POA: Diagnosis not present

## 2016-02-28 DIAGNOSIS — M9903 Segmental and somatic dysfunction of lumbar region: Secondary | ICD-10-CM | POA: Diagnosis not present

## 2016-02-28 DIAGNOSIS — M5432 Sciatica, left side: Secondary | ICD-10-CM | POA: Diagnosis not present

## 2016-03-05 DIAGNOSIS — M5432 Sciatica, left side: Secondary | ICD-10-CM | POA: Diagnosis not present

## 2016-03-05 DIAGNOSIS — M9903 Segmental and somatic dysfunction of lumbar region: Secondary | ICD-10-CM | POA: Diagnosis not present

## 2016-03-13 DIAGNOSIS — M5432 Sciatica, left side: Secondary | ICD-10-CM | POA: Diagnosis not present

## 2016-03-13 DIAGNOSIS — M9903 Segmental and somatic dysfunction of lumbar region: Secondary | ICD-10-CM | POA: Diagnosis not present

## 2016-04-29 DIAGNOSIS — J329 Chronic sinusitis, unspecified: Secondary | ICD-10-CM | POA: Diagnosis not present

## 2016-04-29 DIAGNOSIS — F988 Other specified behavioral and emotional disorders with onset usually occurring in childhood and adolescence: Secondary | ICD-10-CM | POA: Diagnosis not present

## 2016-04-29 DIAGNOSIS — F411 Generalized anxiety disorder: Secondary | ICD-10-CM | POA: Diagnosis not present

## 2016-06-18 DIAGNOSIS — J329 Chronic sinusitis, unspecified: Secondary | ICD-10-CM | POA: Diagnosis not present

## 2016-06-26 DIAGNOSIS — J0100 Acute maxillary sinusitis, unspecified: Secondary | ICD-10-CM | POA: Diagnosis not present

## 2016-07-01 HISTORY — PX: LAMINECTOMY: SHX219

## 2016-07-14 DIAGNOSIS — J329 Chronic sinusitis, unspecified: Secondary | ICD-10-CM | POA: Diagnosis not present

## 2016-07-14 DIAGNOSIS — M722 Plantar fascial fibromatosis: Secondary | ICD-10-CM | POA: Diagnosis not present

## 2016-07-14 DIAGNOSIS — M5432 Sciatica, left side: Secondary | ICD-10-CM | POA: Diagnosis not present

## 2016-07-26 DIAGNOSIS — R438 Other disturbances of smell and taste: Secondary | ICD-10-CM | POA: Diagnosis not present

## 2016-07-26 DIAGNOSIS — J329 Chronic sinusitis, unspecified: Secondary | ICD-10-CM | POA: Diagnosis not present

## 2016-07-26 DIAGNOSIS — J343 Hypertrophy of nasal turbinates: Secondary | ICD-10-CM | POA: Diagnosis not present

## 2016-07-26 HISTORY — DX: Chronic sinusitis, unspecified: J32.9

## 2016-07-30 DIAGNOSIS — J329 Chronic sinusitis, unspecified: Secondary | ICD-10-CM | POA: Diagnosis not present

## 2016-07-31 DIAGNOSIS — M722 Plantar fascial fibromatosis: Secondary | ICD-10-CM | POA: Diagnosis not present

## 2016-07-31 DIAGNOSIS — M5442 Lumbago with sciatica, left side: Secondary | ICD-10-CM | POA: Diagnosis not present

## 2016-08-06 DIAGNOSIS — M5442 Lumbago with sciatica, left side: Secondary | ICD-10-CM | POA: Diagnosis not present

## 2016-08-09 DIAGNOSIS — J019 Acute sinusitis, unspecified: Secondary | ICD-10-CM | POA: Diagnosis not present

## 2016-08-09 DIAGNOSIS — R05 Cough: Secondary | ICD-10-CM | POA: Diagnosis not present

## 2016-08-16 DIAGNOSIS — M5442 Lumbago with sciatica, left side: Secondary | ICD-10-CM | POA: Diagnosis not present

## 2016-08-29 DIAGNOSIS — M5442 Lumbago with sciatica, left side: Secondary | ICD-10-CM | POA: Diagnosis not present

## 2016-09-03 DIAGNOSIS — M722 Plantar fascial fibromatosis: Secondary | ICD-10-CM | POA: Diagnosis not present

## 2016-09-03 DIAGNOSIS — M5442 Lumbago with sciatica, left side: Secondary | ICD-10-CM | POA: Diagnosis not present

## 2016-09-10 DIAGNOSIS — M5442 Lumbago with sciatica, left side: Secondary | ICD-10-CM | POA: Diagnosis not present

## 2016-09-17 DIAGNOSIS — M722 Plantar fascial fibromatosis: Secondary | ICD-10-CM | POA: Diagnosis not present

## 2016-09-23 DIAGNOSIS — M722 Plantar fascial fibromatosis: Secondary | ICD-10-CM | POA: Diagnosis not present

## 2016-09-25 DIAGNOSIS — M722 Plantar fascial fibromatosis: Secondary | ICD-10-CM | POA: Diagnosis not present

## 2016-10-04 DIAGNOSIS — M722 Plantar fascial fibromatosis: Secondary | ICD-10-CM | POA: Diagnosis not present

## 2016-10-07 DIAGNOSIS — M722 Plantar fascial fibromatosis: Secondary | ICD-10-CM | POA: Diagnosis not present

## 2016-10-08 DIAGNOSIS — F411 Generalized anxiety disorder: Secondary | ICD-10-CM | POA: Diagnosis not present

## 2016-10-09 DIAGNOSIS — M722 Plantar fascial fibromatosis: Secondary | ICD-10-CM | POA: Diagnosis not present

## 2016-10-09 DIAGNOSIS — M5442 Lumbago with sciatica, left side: Secondary | ICD-10-CM | POA: Diagnosis not present

## 2016-10-11 DIAGNOSIS — M722 Plantar fascial fibromatosis: Secondary | ICD-10-CM | POA: Diagnosis not present

## 2016-10-15 DIAGNOSIS — M722 Plantar fascial fibromatosis: Secondary | ICD-10-CM | POA: Diagnosis not present

## 2016-10-17 DIAGNOSIS — M722 Plantar fascial fibromatosis: Secondary | ICD-10-CM | POA: Diagnosis not present

## 2016-10-25 DIAGNOSIS — M722 Plantar fascial fibromatosis: Secondary | ICD-10-CM | POA: Diagnosis not present

## 2016-10-29 DIAGNOSIS — M5442 Lumbago with sciatica, left side: Secondary | ICD-10-CM | POA: Diagnosis not present

## 2016-12-13 DIAGNOSIS — F411 Generalized anxiety disorder: Secondary | ICD-10-CM | POA: Diagnosis not present

## 2016-12-13 DIAGNOSIS — E039 Hypothyroidism, unspecified: Secondary | ICD-10-CM | POA: Diagnosis not present

## 2016-12-13 DIAGNOSIS — Z0000 Encounter for general adult medical examination without abnormal findings: Secondary | ICD-10-CM | POA: Diagnosis not present

## 2016-12-13 DIAGNOSIS — E785 Hyperlipidemia, unspecified: Secondary | ICD-10-CM | POA: Diagnosis not present

## 2016-12-13 DIAGNOSIS — K219 Gastro-esophageal reflux disease without esophagitis: Secondary | ICD-10-CM | POA: Diagnosis not present

## 2017-01-06 DIAGNOSIS — M5126 Other intervertebral disc displacement, lumbar region: Secondary | ICD-10-CM | POA: Diagnosis not present

## 2017-01-14 DIAGNOSIS — M5442 Lumbago with sciatica, left side: Secondary | ICD-10-CM | POA: Diagnosis not present

## 2017-01-21 DIAGNOSIS — M5442 Lumbago with sciatica, left side: Secondary | ICD-10-CM | POA: Diagnosis not present

## 2017-01-25 DIAGNOSIS — M5442 Lumbago with sciatica, left side: Secondary | ICD-10-CM | POA: Diagnosis not present

## 2017-01-29 DIAGNOSIS — M5127 Other intervertebral disc displacement, lumbosacral region: Secondary | ICD-10-CM | POA: Diagnosis not present

## 2017-01-29 DIAGNOSIS — M5442 Lumbago with sciatica, left side: Secondary | ICD-10-CM | POA: Diagnosis not present

## 2017-01-29 DIAGNOSIS — M5432 Sciatica, left side: Secondary | ICD-10-CM | POA: Diagnosis not present

## 2017-01-30 ENCOUNTER — Encounter (HOSPITAL_COMMUNITY): Payer: Self-pay | Admitting: *Deleted

## 2017-01-30 ENCOUNTER — Ambulatory Visit: Payer: Self-pay | Admitting: Orthopedic Surgery

## 2017-01-30 NOTE — Progress Notes (Signed)
Please place orders in EPIC as patient is being scheduled for a pre-op appointment! Thank you! 

## 2017-02-04 ENCOUNTER — Other Ambulatory Visit (HOSPITAL_COMMUNITY): Payer: Self-pay | Admitting: Emergency Medicine

## 2017-02-04 DIAGNOSIS — Z6833 Body mass index (BMI) 33.0-33.9, adult: Secondary | ICD-10-CM | POA: Diagnosis not present

## 2017-02-04 DIAGNOSIS — Z01419 Encounter for gynecological examination (general) (routine) without abnormal findings: Secondary | ICD-10-CM | POA: Diagnosis not present

## 2017-02-04 NOTE — Patient Instructions (Signed)
Sheila Mathews  02/04/2017   Your procedure is scheduled on: 02-12-17  [facility]  Entrance Take [place]  elevators to 3rd floor to  Short Stay Center at 930AM.   Call this number if you have problems the morning of surgery [phone]   Remember: ONLY 1 PERSON MAY GO WITH YOU TO SHORT STAY TO GET  READY MORNING OF YOUR SURGERY.  Do not eat food or drink liquids :After Midnight.     Take these medicines the morning of surgery with A SIP OF WATER: TYLENOL AS NEEDED, DULOXETINE(CYMBALTA), GABAPENTIN                                You may not have any metal on your body including hair pins and              piercings  Do not wear jewelry, make-up, lotions, powders or perfumes, deodorant             Do not wear nail polish.  Do not shave  48 hours prior to surgery.           Do not bring valuables to the hospital. [facility] IS NOT             RESPONSIBLE   FOR VALUABLES.  Contacts, dentures or bridgework may not be worn into surgery.  Leave suitcase in the car. After surgery it may be brought to your room.                Please read over the following fact sheets you were given: _____________________________________________________________________            [facility] - Preparing for Surgery Before surgery, you can play an important role.  Because skin is not sterile, your skin needs to be as free of germs as possible.  You can reduce the number of germs on your skin by washing with CHG (chlorahexidine gluconate) soap before surgery.  CHG is an antiseptic cleaner which kills germs and bonds with the skin to continue killing germs even after washing. Please DO NOT use if you have an allergy to CHG or antibacterial soaps.  If your skin becomes reddened/irritated stop using the CHG and inform your nurse when you arrive at Short Stay. Do not shave (including legs and underarms) for at least 48 hours prior to the first CHG shower.  You may shave your  face/neck. Please follow these instructions carefully:  1.  Shower with CHG Soap the night before surgery and the  morning of Surgery.  2.  If you choose to wash your hair, wash your hair first as usual with your  normal  shampoo.  3.  After you shampoo, rinse your hair and body thoroughly to remove the  shampoo.                           4.  Use CHG as you would any other liquid soap.  You can apply chg directly  to the skin and wash                       Gently with a scrungie or clean washcloth.  5.  Apply the CHG Soap to your body ONLY FROM THE NECK DOWN.  Do not use on face/ open                           Wound or open sores. Avoid contact with eyes, ears mouth and genitals (private parts).                       Wash face,  Genitals (private parts) with your normal soap.             6.  Wash thoroughly, paying special attention to the area where your surgery  will be performed.  7.  Thoroughly rinse your body with warm water from the neck down.  8.  DO NOT shower/wash with your normal soap after using and rinsing off  the CHG Soap.                9.  Pat yourself dry with a clean towel.            10.  Wear clean pajamas.            11.  Place clean sheets on your bed the night of your first shower and do not  sleep with pets. Day of Surgery : Do not apply any lotions/deodorants the morning of surgery.  Please wear clean clothes to the hospital/surgery center.  FAILURE TO FOLLOW THESE INSTRUCTIONS MAY RESULT IN THE CANCELLATION OF YOUR SURGERY PATIENT SIGNATURE_________________________________  NURSE SIGNATURE__________________________________  ________________________________________________________________________   Sheila Mathews  An incentive spirometer is a tool that can help keep your lungs clear and active. This tool measures how well you are filling your lungs with each breath. Taking long deep breaths may help reverse or decrease the chance of developing breathing  (pulmonary) problems (especially infection) following:  A long period of time when you are unable to move or be active. BEFORE THE PROCEDURE   If the spirometer includes an indicator to show your best effort, your nurse or respiratory therapist will set it to a desired goal.  If possible, sit up straight or lean slightly forward. Try not to slouch.  Hold the incentive spirometer in an upright position. INSTRUCTIONS FOR USE  1. Sit on the edge of your bed if possible, or sit up as far as you can in bed or on a chair. 2. Hold the incentive spirometer in an upright position. 3. Breathe out normally. 4. Place the mouthpiece in your mouth and seal your lips tightly around it. 5. Breathe in slowly and as deeply as possible, raising the piston or the ball toward the top of the column. 6. Hold your breath for 3-5 seconds or for as long as possible. Allow the piston or ball to fall to the bottom of the column. 7. Remove the mouthpiece from your mouth and breathe out normally. 8. Rest for a few seconds and repeat Steps 1 through 7 at least 10 times every 1-2 hours when you are awake. Take your time and take a few normal breaths between deep breaths. 9. The spirometer may include an indicator to show your best effort. Use the indicator as a goal to work toward during each repetition. 10. After each set of 10 deep breaths, practice coughing to be sure your lungs are clear. If you have an incision (the cut made at the time of surgery), support your incision when coughing by placing a pillow or rolled up towels firmly against it. Once you are able to get out of  bed, walk around indoors and cough well. You may stop using the incentive spirometer when instructed by your caregiver.  RISKS AND COMPLICATIONS  Take your time so you do not get dizzy or light-headed.  If you are in pain, you may need to take or ask for pain medication before doing incentive spirometry. It is harder to take a deep breath if you  are having pain. AFTER USE  Rest and breathe slowly and easily.  It can be helpful to keep track of a log of your progress. Your caregiver can provide you with a simple table to help with this. If you are using the spirometer at home, follow these instructions: [place] IF:   You are having difficultly using the spirometer.  You have trouble using the spirometer as often as instructed.  Your pain medication is not giving enough relief while using the spirometer.  You develop fever of 100.5 F (38.1 C) or higher. SEEK IMMEDIATE MEDICAL CARE IF:   You cough up bloody sputum that had not been present before.  You develop fever of 102 F (38.9 C) or greater.  You develop worsening pain at or near the incision site. MAKE SURE YOU:   Understand these instructions.  Will watch your condition.  Will get help right away if you are not doing well or get worse. Document Released: 10/28/2006 Document Revised: 09/09/2011 Document Reviewed: 12/29/2006 [facility] Patient Information 2014 [place], [place].   ________________________________________________________________________

## 2017-02-05 ENCOUNTER — Ambulatory Visit (HOSPITAL_COMMUNITY)
Admission: RE | Admit: 2017-02-05 | Discharge: 2017-02-05 | Disposition: A | Discharge status: Home / Self Care | Payer: BC Managed Care – PPO | Source: Ambulatory Visit | Attending: Orthopedic Surgery | Admitting: Orthopedic Surgery

## 2017-02-05 ENCOUNTER — Encounter (HOSPITAL_COMMUNITY): Payer: Self-pay

## 2017-02-05 ENCOUNTER — Encounter (HOSPITAL_COMMUNITY)
Admission: RE | Admit: 2017-02-05 | Discharge: 2017-02-05 | Disposition: A | Discharge status: Home / Self Care | Payer: BC Managed Care – PPO | Source: Ambulatory Visit | Attending: Specialist | Admitting: Specialist

## 2017-02-05 DIAGNOSIS — M5126 Other intervertebral disc displacement, lumbar region: Secondary | ICD-10-CM | POA: Diagnosis not present

## 2017-02-05 DIAGNOSIS — M4186 Other forms of scoliosis, lumbar region: Secondary | ICD-10-CM | POA: Diagnosis not present

## 2017-02-05 HISTORY — DX: Other specified postprocedural states: Z98.890

## 2017-02-05 HISTORY — DX: Sciatica, unspecified side: M54.30

## 2017-02-05 HISTORY — DX: Nausea with vomiting, unspecified: R11.2

## 2017-02-05 LAB — CBC
HCT: 36.1 % (ref 36.0–46.0)
Hemoglobin: 11.9 g/dL — ABNORMAL LOW (ref 12.0–15.0)
MCH: 28.9 pg (ref 26.0–34.0)
MCHC: 33 g/dL (ref 30.0–36.0)
MCV: 87.6 fL (ref 78.0–100.0)
Platelets: 402 10*3/uL — ABNORMAL HIGH (ref 150–400)
RBC: 4.12 MIL/uL (ref 3.87–5.11)
RDW: 13.5 % (ref 11.5–15.5)
WBC: 7.5 10*3/uL (ref 4.0–10.5)

## 2017-02-05 LAB — HCG, SERUM, QUALITATIVE: Preg, Serum: NEGATIVE

## 2017-02-05 LAB — BASIC METABOLIC PANEL
Anion gap: 10 (ref 5–15)
BUN: 12 mg/dL (ref 6–20)
CALCIUM: 9.3 mg/dL (ref 8.9–10.3)
CHLORIDE: 105 mmol/L (ref 101–111)
CO2: 25 mmol/L (ref 22–32)
CREATININE: 0.58 mg/dL (ref 0.44–1.00)
GFR calc non Af Amer: >60 mL/min (ref >60)
Glucose, Bld: 91 mg/dL (ref 65–99)
Potassium: 4.2 mmol/L (ref 3.5–5.1)
SODIUM: 140 mmol/L (ref 135–145)

## 2017-02-05 LAB — SURGICAL PCR SCREEN
MRSA, PCR: NEGATIVE
Staphylococcus aureus: NEGATIVE

## 2017-02-11 ENCOUNTER — Ambulatory Visit: Payer: Self-pay | Admitting: Orthopedic Surgery

## 2017-02-11 NOTE — H&P (Signed)
Sheila Mathews is an 41 y.o. female.   Chief Complaint: back and leg pain HPI: The patient is a 41 year old female who presents today for follow up of their back. The patient is being followed for their left-sided back pain and Lt. leg pain. They are now 6 week(s) out from when symptoms began. Symptoms reported today include: pain, aching, burning, leg pain and pain with sitting. The patient states that they are doing poorly. Current treatment includes: physical therapy, NSAIDs and gabapentin, robaxin. The patient reports their current pain level to be 7 / 10. The patient presents today following MRI, ESI and physical therapy. The patient has reported improvement of their symptoms with: activity modification, conservative measures, corticosteroid use, Cortisone injections and NSAIDs. The patient indicates that they have questions or concerns today regarding pain and their progress at this point.  Sheila Mathews follows up and dramatically worse. Pain radiates down to the outer aspect of her foot. She is here with her husband and kids. She has had these symptoms for six months and now worse as of late. Hydrocodone, Percocet were not helpful to her. The gabapentin, she is taking one three times a day that does not help. We have talked about titrating that upwards.  Past Medical History:  Diagnosis Date  . Anxiety   . Complication of anesthesia    pt doesn't like mask she has panic attack; childhood  . GERD (gastroesophageal reflux disease)   . Headache(784.0)   . Pneumonia   . PONV (postoperative nausea and vomiting)    no issues with short procdures, had one isntance of vomitus following a longer procedure   . Sciatica     Past Surgical History:  Procedure Laterality Date  . CESAREAN SECTION  x2  . LAPAROSCOPY N/A 05/13/2014   Procedure: LAPAROSCOPY DIAGNOSTIC;  Surgeon: Lavina Hammanodd Meisinger, MD;  Location: [facility];  Service: Gynecology;  Laterality: N/A;  . saliva gland removal    . UPPER GI  ENDOSCOPY    . WISDOM TOOTH EXTRACTION      Family History  Problem Relation Age of Onset  . Cancer Maternal Grandmother        breast   . Cancer Maternal Grandfather        head neck   . Diabetes Maternal Grandfather   . Cancer Paternal Grandmother        gallbladder  . Cancer Paternal Grandfather        colon   . Diabetes Paternal Grandfather    Social History:  reports that she has never smoked. She has never used smokeless tobacco. She reports that she drinks alcohol. She reports that she does not use drugs.  Allergies:  Allergies  Allergen Reactions  . Hydrocodone Other (See Comments)    Makes patient aggressive      (Not in a hospital admission)  No results found for this or any previous visit (from the past 48 hour(s)). No results found.  Review of Systems  Constitutional: Negative.   HENT: Negative.   Eyes: Negative.   Respiratory: Negative.   Cardiovascular: Negative.   Gastrointestinal: Negative.   Genitourinary: Negative.   Musculoskeletal: Positive for back pain.  Skin: Negative.   Neurological: Positive for sensory change and focal weakness.    There were no vitals taken for this visit. Physical Exam  Constitutional: She is oriented to person, place, and time. She appears well-developed.  HENT:  Head: Normocephalic.  Eyes: Pupils are equal, round, and reactive to light.  Neck: Normal range of motion.  Cardiovascular: Normal rate.   Respiratory: Effort normal.  GI: Soft.  Musculoskeletal:  On exam, severe distress. Mood and affect appropriate. She walks with slight antalgic gait. Straight leg raise, buttock, thigh and calf pain on the left, negative on the right. EHL is 4+/5 in the left compared to the right. She has diminished repetitive plantar flexion. Slight decrease in Achilles on the left compared to the right. Her sensation though is perhaps slightly altered in the S1 dermatome.  Neurological: She is alert and oriented to person, place, and  time.  Skin: Skin is warm.     MRI demonstrates an interval increase in the disk herniation, now displacing the S1 nerve root. Small disk protrusion of 4-5, noncompressive.  Assessment/Plan 1. 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 2. Disc degeneration, small noncompressive disk protrusion at 4-5. 3. Elevated BMI.  I had extensive discussion concerning current pathology, relevant anatomy and treatment options. Up to 25 minutes dedicated to this discussion, review of her MRI, ttreatment to date, failing conservative treatment, including epidural, physical therapy, activity modification, analgesics, option living with her symptoms versus consideration of microlumbar decompression at L5-S1 on the left to decompress the S1 nerve root, given the S1 radiculopathy. She does have displacement, but not obvious compression noted on her supine MRI, typically in the upright position, where she is worse and with setting, the disk protrudes further and can demonstrate dynamic further neural compression.  She does have radicular symptoms on exam, failing conservative treatment. Again, we discussed lumbar decompressions.  I had an extensive discussion of the risks and benefits of the lumbar decompression with the patient including bleeding, infection, damage to neurovascular structures, epidural fibrosis, CSF leak requiring repair. We also discussed increase in pain, adjacent segment disease, recurrent disc herniation, need for future surgery including repeat decompression and/or fusion. We also discussed risks of postoperative hematoma, paralysis, anesthetic complications including DVT, PE, death, cardiopulmonary dysfunction. In addition, the perioperative and postoperative courses were discussed in detail including the rehabilitative time and return to functional activity and work. I provided the patient with an illustrated  handout and utilized the appropriate surgical models.  Specifically, though, this is a procedure to essentially make more room for the nerve as oppose to ""fix the disc."" If she would have residual disk degeneration, that will require disk pressure management, conservative treatment and strategies to avoid reherniation, which indicated could be 15% chance of that and progressive disc degeneration requiring a fusion. We will try Tylenol with Codeine, which she has had in the past. Titrate up on her gabapentin. We spent considerable time discussing these issues. No DVT. No history of allergy to penicillin or MRA.  Plan: microlumbar decompression L5-S1 left  Dorothy Spark., PA-C for Dr. Shelle Iron 02/11/2017, 8:14 AM

## 2017-02-12 ENCOUNTER — Encounter (HOSPITAL_COMMUNITY): Payer: Self-pay

## 2017-02-12 ENCOUNTER — Encounter (HOSPITAL_COMMUNITY)
Admission: RE | Disposition: A | Discharge status: Home / Self Care | Payer: Self-pay | Source: Ambulatory Visit | Attending: Specialist

## 2017-02-12 ENCOUNTER — Ambulatory Visit (HOSPITAL_COMMUNITY): Payer: BC Managed Care – PPO

## 2017-02-12 ENCOUNTER — Ambulatory Visit (HOSPITAL_COMMUNITY): Payer: BC Managed Care – PPO | Admitting: Anesthesiology

## 2017-02-12 ENCOUNTER — Ambulatory Visit (HOSPITAL_COMMUNITY)
Admission: RE | Admit: 2017-02-12 | Discharge: 2017-02-13 | Disposition: A | Discharge status: Home / Self Care | Payer: BC Managed Care – PPO | Source: Ambulatory Visit | Attending: Specialist | Admitting: Specialist

## 2017-02-12 DIAGNOSIS — M5116 Intervertebral disc disorders with radiculopathy, lumbar region: Secondary | ICD-10-CM | POA: Diagnosis not present

## 2017-02-12 DIAGNOSIS — F419 Anxiety disorder, unspecified: Secondary | ICD-10-CM | POA: Diagnosis not present

## 2017-02-12 DIAGNOSIS — M48061 Spinal stenosis, lumbar region without neurogenic claudication: Secondary | ICD-10-CM | POA: Diagnosis not present

## 2017-02-12 DIAGNOSIS — M4807 Spinal stenosis, lumbosacral region: Secondary | ICD-10-CM | POA: Insufficient documentation

## 2017-02-12 DIAGNOSIS — K219 Gastro-esophageal reflux disease without esophagitis: Secondary | ICD-10-CM | POA: Diagnosis not present

## 2017-02-12 DIAGNOSIS — R102 Pelvic and perineal pain: Secondary | ICD-10-CM | POA: Diagnosis not present

## 2017-02-12 DIAGNOSIS — M5126 Other intervertebral disc displacement, lumbar region: Secondary | ICD-10-CM | POA: Diagnosis not present

## 2017-02-12 DIAGNOSIS — M47816 Spondylosis without myelopathy or radiculopathy, lumbar region: Secondary | ICD-10-CM | POA: Diagnosis not present

## 2017-02-12 DIAGNOSIS — M5137 Other intervertebral disc degeneration, lumbosacral region: Secondary | ICD-10-CM | POA: Diagnosis not present

## 2017-02-12 DIAGNOSIS — Z419 Encounter for procedure for purposes other than remedying health state, unspecified: Secondary | ICD-10-CM

## 2017-02-12 DIAGNOSIS — M5127 Other intervertebral disc displacement, lumbosacral region: Secondary | ICD-10-CM | POA: Insufficient documentation

## 2017-02-12 DIAGNOSIS — M48062 Spinal stenosis, lumbar region with neurogenic claudication: Secondary | ICD-10-CM | POA: Diagnosis not present

## 2017-02-12 HISTORY — PX: LUMBAR LAMINECTOMY/DECOMPRESSION MICRODISCECTOMY: SHX5026

## 2017-02-12 SURGERY — LUMBAR LAMINECTOMY/DECOMPRESSION MICRODISCECTOMY 1 LEVEL
Anesthesia: General | Laterality: Left

## 2017-02-12 MED ORDER — SCOPOLAMINE 1 MG/3DAYS TD PT72
MEDICATED_PATCH | TRANSDERMAL | Status: DC | PRN
  Administered 2017-02-12: 1 via TRANSDERMAL

## 2017-02-12 MED ORDER — SCOPOLAMINE 1 MG/3DAYS TD PT72
MEDICATED_PATCH | TRANSDERMAL | Status: AC
  Filled 2017-02-12: qty 1

## 2017-02-12 MED ORDER — MONTELUKAST SODIUM 10 MG PO TABS
10.0000 mg | ORAL_TABLET | Freq: Every evening | ORAL | Status: DC
  Administered 2017-02-12: 10 mg via ORAL
  Filled 2017-02-12: qty 1

## 2017-02-12 MED ORDER — ONDANSETRON HCL 4 MG PO TABS: 4.0000 mg | ORAL_TABLET | Freq: Four times a day (QID) | ORAL | Status: DC | PRN

## 2017-02-12 MED ORDER — PANTOPRAZOLE SODIUM 40 MG PO TBEC: 40.0000 mg | DELAYED_RELEASE_TABLET | Freq: Every day | ORAL | Status: DC

## 2017-02-12 MED ORDER — KCL IN DEXTROSE-NACL 20-5-0.45 MEQ/L-%-% IV SOLN
INTRAVENOUS | Status: DC
  Administered 2017-02-12: 21:00:00 via INTRAVENOUS
  Filled 2017-02-12: qty 1000

## 2017-02-12 MED ORDER — CEFAZOLIN SODIUM-DEXTROSE 2-4 GM/100ML-% IV SOLN
2.0000 g | INTRAVENOUS | Status: AC
  Administered 2017-02-12: 2 g via INTRAVENOUS
  Filled 2017-02-12: qty 100

## 2017-02-12 MED ORDER — DEXAMETHASONE SODIUM PHOSPHATE 10 MG/ML IJ SOLN
INTRAMUSCULAR | Status: AC
  Filled 2017-02-12: qty 1

## 2017-02-12 MED ORDER — PROPOFOL 10 MG/ML IV BOLUS
INTRAVENOUS | Status: DC | PRN
  Administered 2017-02-12: 170 mg via INTRAVENOUS

## 2017-02-12 MED ORDER — POLYETHYLENE GLYCOL 3350 17 G PO PACK: 17.0000 g | PACK | Freq: Every day | ORAL | 0 refills | Status: DC

## 2017-02-12 MED ORDER — LACTATED RINGERS IV SOLN
INTRAVENOUS | Status: DC
  Administered 2017-02-12: 10:00:00 via INTRAVENOUS
  Administered 2017-02-12: 1000 mL via INTRAVENOUS

## 2017-02-12 MED ORDER — POLYETHYLENE GLYCOL 3350 17 G PO PACK
17.0000 g | PACK | Freq: Every day | ORAL | Status: DC | PRN
  Administered 2017-02-12 – 2017-02-13 (×2): 17 g via ORAL
  Filled 2017-02-12 (×2): qty 1

## 2017-02-12 MED ORDER — HYDROMORPHONE HCL-NACL 0.5-0.9 MG/ML-% IV SOSY
PREFILLED_SYRINGE | INTRAVENOUS | Status: AC
  Filled 2017-02-12: qty 2

## 2017-02-12 MED ORDER — METHOCARBAMOL 500 MG PO TABS
500.0000 mg | ORAL_TABLET | Freq: Four times a day (QID) | ORAL | Status: DC | PRN
  Administered 2017-02-12: 500 mg via ORAL
  Filled 2017-02-12: qty 1

## 2017-02-12 MED ORDER — ROCURONIUM BROMIDE 50 MG/5ML IV SOSY
PREFILLED_SYRINGE | INTRAVENOUS | Status: AC
  Filled 2017-02-12: qty 5

## 2017-02-12 MED ORDER — GABAPENTIN 300 MG PO CAPS
900.0000 mg | ORAL_CAPSULE | Freq: Two times a day (BID) | ORAL | Status: DC
  Administered 2017-02-12 – 2017-02-13 (×2): 900 mg via ORAL
  Filled 2017-02-12 (×2): qty 3

## 2017-02-12 MED ORDER — ONDANSETRON HCL 4 MG/2ML IJ SOLN
INTRAMUSCULAR | Status: DC | PRN
  Administered 2017-02-12: 4 mg via INTRAVENOUS

## 2017-02-12 MED ORDER — ALUM & MAG HYDROXIDE-SIMETH 200-200-20 MG/5ML PO SUSP: 30.0000 mL | Freq: Four times a day (QID) | ORAL | Status: DC | PRN

## 2017-02-12 MED ORDER — ACETAMINOPHEN 500 MG PO TABS: 500.0000 mg | ORAL_TABLET | Freq: Four times a day (QID) | ORAL | Status: DC | PRN

## 2017-02-12 MED ORDER — SODIUM CHLORIDE 0.9 % IV SOLN
INTRAVENOUS | Status: DC | PRN
  Administered 2017-02-12: 500 mL

## 2017-02-12 MED ORDER — HYDROMORPHONE HCL-NACL 0.5-0.9 MG/ML-% IV SOSY
0.2500 mg | PREFILLED_SYRINGE | INTRAVENOUS | Status: DC | PRN
  Administered 2017-02-12 (×2): 0.5 mg via INTRAVENOUS

## 2017-02-12 MED ORDER — LEVOCETIRIZINE DIHYDROCHLORIDE 5 MG PO TABS: 5.0000 mg | ORAL_TABLET | Freq: Every day | ORAL | Status: DC

## 2017-02-12 MED ORDER — THROMBIN 5000 UNITS EX SOLR
CUTANEOUS | Status: AC
  Filled 2017-02-12: qty 10000

## 2017-02-12 MED ORDER — DULOXETINE HCL 60 MG PO CPEP
60.0000 mg | ORAL_CAPSULE | Freq: Every day | ORAL | Status: DC
  Administered 2017-02-13: 60 mg via ORAL
  Filled 2017-02-12: qty 1

## 2017-02-12 MED ORDER — PHENOL 1.4 % MT LIQD: 1.0000 | OROMUCOSAL | Status: DC | PRN

## 2017-02-12 MED ORDER — DEXAMETHASONE SODIUM PHOSPHATE 10 MG/ML IJ SOLN
INTRAMUSCULAR | Status: DC | PRN
  Administered 2017-02-12: 10 mg via INTRAVENOUS

## 2017-02-12 MED ORDER — ONDANSETRON HCL 4 MG/2ML IJ SOLN: 4.0000 mg | Freq: Four times a day (QID) | INTRAMUSCULAR | Status: DC | PRN

## 2017-02-12 MED ORDER — ELETRIPTAN HYDROBROMIDE 40 MG PO TABS
40.0000 mg | ORAL_TABLET | ORAL | Status: DC | PRN
  Filled 2017-02-12: qty 1

## 2017-02-12 MED ORDER — CEFAZOLIN SODIUM-DEXTROSE 2-4 GM/100ML-% IV SOLN
2.0000 g | Freq: Three times a day (TID) | INTRAVENOUS | Status: AC
  Administered 2017-02-12 – 2017-02-13 (×3): 2 g via INTRAVENOUS
  Filled 2017-02-12 (×3): qty 100

## 2017-02-12 MED ORDER — DOCUSATE SODIUM 100 MG PO CAPS: 100.0000 mg | ORAL_CAPSULE | Freq: Two times a day (BID) | ORAL | 1 refills | Status: DC | PRN

## 2017-02-12 MED ORDER — THROMBIN 5000 UNITS EX SOLR
CUTANEOUS | Status: DC | PRN
  Administered 2017-02-12: 10 mL via TOPICAL

## 2017-02-12 MED ORDER — ROCURONIUM BROMIDE 100 MG/10ML IV SOLN
INTRAVENOUS | Status: DC | PRN
  Administered 2017-02-12: 50 mg via INTRAVENOUS
  Administered 2017-02-12: 10 mg via INTRAVENOUS

## 2017-02-12 MED ORDER — MENTHOL 3 MG MT LOZG: 1.0000 | LOZENGE | OROMUCOSAL | Status: DC | PRN

## 2017-02-12 MED ORDER — ALPRAZOLAM 1 MG PO TABS: 1.0000 mg | ORAL_TABLET | Freq: Every evening | ORAL | Status: DC | PRN

## 2017-02-12 MED ORDER — ONDANSETRON HCL 4 MG/2ML IJ SOLN
INTRAMUSCULAR | Status: AC
  Filled 2017-02-12: qty 2

## 2017-02-12 MED ORDER — LORATADINE 10 MG PO TABS
10.0000 mg | ORAL_TABLET | Freq: Every day | ORAL | Status: DC
  Filled 2017-02-12: qty 1

## 2017-02-12 MED ORDER — LIDOCAINE-EPINEPHRINE (PF) 1 %-1:200000 IJ SOLN
INTRAMUSCULAR | Status: DC | PRN
  Administered 2017-02-12: 8 mL

## 2017-02-12 MED ORDER — DOCUSATE SODIUM 100 MG PO CAPS
100.0000 mg | ORAL_CAPSULE | Freq: Two times a day (BID) | ORAL | Status: DC
  Administered 2017-02-12 – 2017-02-13 (×2): 100 mg via ORAL
  Filled 2017-02-12 (×3): qty 1

## 2017-02-12 MED ORDER — SUGAMMADEX SODIUM 200 MG/2ML IV SOLN
INTRAVENOUS | Status: AC
  Filled 2017-02-12: qty 2

## 2017-02-12 MED ORDER — ACETAMINOPHEN-CODEINE #3 300-30 MG PO TABS
1.0000 | ORAL_TABLET | ORAL | Status: DC | PRN
  Administered 2017-02-12: 1 via ORAL
  Administered 2017-02-12: 2 via ORAL
  Administered 2017-02-13 (×2): 1 via ORAL
  Filled 2017-02-12 (×2): qty 1
  Filled 2017-02-12: qty 2
  Filled 2017-02-12: qty 1
  Filled 2017-02-12: qty 2

## 2017-02-12 MED ORDER — METHOCARBAMOL 1000 MG/10ML IJ SOLN
500.0000 mg | Freq: Four times a day (QID) | INTRAVENOUS | Status: DC | PRN
  Filled 2017-02-12: qty 5

## 2017-02-12 MED ORDER — ACETAMINOPHEN 325 MG PO TABS
650.0000 mg | ORAL_TABLET | ORAL | Status: DC | PRN
  Administered 2017-02-12: 650 mg via ORAL
  Filled 2017-02-12: qty 2

## 2017-02-12 MED ORDER — AZELASTINE HCL 0.1 % NA SOLN
1.0000 | Freq: Two times a day (BID) | NASAL | Status: DC | PRN
  Filled 2017-02-12: qty 30

## 2017-02-12 MED ORDER — ACETAMINOPHEN 650 MG RE SUPP: 650.0000 mg | RECTAL | Status: DC | PRN

## 2017-02-12 MED ORDER — ACETAMINOPHEN-CODEINE #3 300-30 MG PO TABS: 1.0000 | ORAL_TABLET | ORAL | 0 refills | Status: DC | PRN

## 2017-02-12 MED ORDER — METHOCARBAMOL 500 MG PO TABS: 500.0000 mg | ORAL_TABLET | Freq: Four times a day (QID) | ORAL | 1 refills | Status: DC | PRN

## 2017-02-12 MED ORDER — SUGAMMADEX SODIUM 200 MG/2ML IV SOLN
INTRAVENOUS | Status: DC | PRN
  Administered 2017-02-12: 200 mg via INTRAVENOUS

## 2017-02-12 MED ORDER — HYDROMORPHONE HCL-NACL 0.5-0.9 MG/ML-% IV SOSY: 1.0000 mg | PREFILLED_SYRINGE | INTRAVENOUS | Status: DC | PRN

## 2017-02-12 MED ORDER — SODIUM CHLORIDE 0.9 % IV SOLN
INTRAVENOUS | Status: AC
  Filled 2017-02-12: qty 500000

## 2017-02-12 MED ORDER — LIDOCAINE HCL (CARDIAC) 20 MG/ML IV SOLN
INTRAVENOUS | Status: DC | PRN
  Administered 2017-02-12: 100 mg via INTRAVENOUS

## 2017-02-12 MED ORDER — RISAQUAD PO CAPS
1.0000 | ORAL_CAPSULE | Freq: Every day | ORAL | Status: DC
  Administered 2017-02-13: 1 via ORAL
  Filled 2017-02-12: qty 1

## 2017-02-12 MED ORDER — FENTANYL CITRATE (PF) 100 MCG/2ML IJ SOLN
INTRAMUSCULAR | Status: DC | PRN
  Administered 2017-02-12: 50 ug via INTRAVENOUS
  Administered 2017-02-12: 100 ug via INTRAVENOUS

## 2017-02-12 MED ORDER — MAGNESIUM CITRATE PO SOLN: 1.0000 | Freq: Once | ORAL | Status: DC | PRN

## 2017-02-12 MED ORDER — LIDOCAINE 2% (20 MG/ML) 5 ML SYRINGE
INTRAMUSCULAR | Status: AC
  Filled 2017-02-12: qty 5

## 2017-02-12 MED ORDER — BUPIVACAINE-EPINEPHRINE (PF) 0.5% -1:200000 IJ SOLN
INTRAMUSCULAR | Status: AC
  Filled 2017-02-12: qty 30

## 2017-02-12 MED ORDER — MIDAZOLAM HCL 5 MG/5ML IJ SOLN
INTRAMUSCULAR | Status: DC | PRN
  Administered 2017-02-12: 2 mg via INTRAVENOUS

## 2017-02-12 MED ORDER — MIDAZOLAM HCL 2 MG/2ML IJ SOLN
INTRAMUSCULAR | Status: AC
  Filled 2017-02-12: qty 2

## 2017-02-12 MED ORDER — PROMETHAZINE HCL 25 MG/ML IJ SOLN: 6.2500 mg | INTRAMUSCULAR | Status: DC | PRN

## 2017-02-12 MED ORDER — ACETAMINOPHEN 10 MG/ML IV SOLN
1000.0000 mg | INTRAVENOUS | Status: AC
  Administered 2017-02-12: 1000 mg via INTRAVENOUS
  Filled 2017-02-12: qty 100

## 2017-02-12 MED ORDER — FENTANYL CITRATE (PF) 250 MCG/5ML IJ SOLN
INTRAMUSCULAR | Status: AC
  Filled 2017-02-12: qty 5

## 2017-02-12 MED ORDER — PROPOFOL 10 MG/ML IV BOLUS
INTRAVENOUS | Status: AC
  Filled 2017-02-12: qty 20

## 2017-02-12 MED ORDER — BISACODYL 5 MG PO TBEC: 5.0000 mg | DELAYED_RELEASE_TABLET | Freq: Every day | ORAL | Status: DC | PRN

## 2017-02-12 SURGICAL SUPPLY — 47 items
BAG ZIPLOCK 12X15 (MISCELLANEOUS) IMPLANT
CLEANER TIP ELECTROSURG 2X2 (MISCELLANEOUS) ×2 IMPLANT
CLOTH 2% CHLOROHEXIDINE 3PK (PERSONAL CARE ITEMS) ×2 IMPLANT
COVER SURGICAL LIGHT HANDLE (MISCELLANEOUS) ×2 IMPLANT
DRAPE MICROSCOPE LEICA (MISCELLANEOUS) ×2 IMPLANT
DRAPE POUCH INSTRU U-SHP 10X18 (DRAPES) ×2 IMPLANT
DRAPE SHEET LG 3/4 BI-LAMINATE (DRAPES) ×2 IMPLANT
DRAPE SURG 17X11 SM STRL (DRAPES) ×2 IMPLANT
DRAPE UTILITY XL STRL (DRAPES) ×2 IMPLANT
DRSG AQUACEL AG ADV 3.5X 4 (GAUZE/BANDAGES/DRESSINGS) ×2 IMPLANT
DRSG AQUACEL AG ADV 3.5X 6 (GAUZE/BANDAGES/DRESSINGS) IMPLANT
DURAPREP 26ML APPLICATOR (WOUND CARE) ×2 IMPLANT
DURASEAL SPINE SEALANT 3ML (MISCELLANEOUS) IMPLANT
ELECT BLADE TIP CTD 4 INCH (ELECTRODE) IMPLANT
ELECT REM PT RETURN 15FT ADLT (MISCELLANEOUS) ×2 IMPLANT
GLOVE BIOGEL PI IND STRL 7.0 (GLOVE) ×1 IMPLANT
GLOVE BIOGEL PI INDICATOR 7.0 (GLOVE) ×1
GLOVE SURG SS PI 7.0 STRL IVOR (GLOVE) ×2 IMPLANT
GLOVE SURG SS PI 7.5 STRL IVOR (GLOVE) ×2 IMPLANT
GLOVE SURG SS PI 8.0 STRL IVOR (GLOVE) ×4 IMPLANT
GOWN STRL REUS W/TWL XL LVL3 (GOWN DISPOSABLE) ×4 IMPLANT
HEMOSTAT SPONGE AVITENE ULTRA (HEMOSTASIS) IMPLANT
IV CATH 14GX2 1/4 (CATHETERS) ×2 IMPLANT
KIT BASIN OR (CUSTOM PROCEDURE TRAY) ×2 IMPLANT
KIT POSITIONING SURG ANDREWS (MISCELLANEOUS) ×2 IMPLANT
MANIFOLD NEPTUNE II (INSTRUMENTS) ×2 IMPLANT
NEEDLE SPNL 18GX3.5 QUINCKE PK (NEEDLE) ×4 IMPLANT
PACK LAMINECTOMY ORTHO (CUSTOM PROCEDURE TRAY) ×2 IMPLANT
PATTIES SURGICAL .5 X.5 (GAUZE/BANDAGES/DRESSINGS) ×2 IMPLANT
PATTIES SURGICAL .75X.75 (GAUZE/BANDAGES/DRESSINGS) IMPLANT
PATTIES SURGICAL 1X1 (DISPOSABLE) IMPLANT
RUBBERBAND STERILE (MISCELLANEOUS) ×4 IMPLANT
SPONGE SURGIFOAM ABS GEL 100 (HEMOSTASIS) ×2 IMPLANT
STAPLER VISISTAT (STAPLE) IMPLANT
STRIP CLOSURE SKIN 1/2X4 (GAUZE/BANDAGES/DRESSINGS) ×2 IMPLANT
SUT NURALON 4 0 TR CR/8 (SUTURE) IMPLANT
SUT PROLENE 3 0 PS 2 (SUTURE) ×2 IMPLANT
SUT VIC AB 1 CT1 27 (SUTURE) ×1
SUT VIC AB 1 CT1 27XBRD ANTBC (SUTURE) ×1 IMPLANT
SUT VIC AB 1-0 CT2 27 (SUTURE) ×2 IMPLANT
SUT VIC AB 2-0 CT1 27 (SUTURE)
SUT VIC AB 2-0 CT1 TAPERPNT 27 (SUTURE) IMPLANT
SUT VIC AB 2-0 CT2 27 (SUTURE) ×4 IMPLANT
SYR 3ML LL SCALE MARK (SYRINGE) IMPLANT
TOWEL OR 17X26 10 PK STRL BLUE (TOWEL DISPOSABLE) ×2 IMPLANT
TOWEL OR NON WOVEN STRL DISP B (DISPOSABLE) ×2 IMPLANT
YANKAUER SUCT BULB TIP NO VENT (SUCTIONS) IMPLANT

## 2017-02-12 NOTE — Anesthesia Preprocedure Evaluation (Addendum)
Anesthesia Evaluation  Patient identified by MRN, date of birth, ID band Patient awake    Reviewed: Allergy & Precautions, NPO status , Patient's Chart, lab work & pertinent test results  History of Anesthesia Complications (+) PONV  Airway Mallampati: II  TM Distance: >3 FB Neck ROM: Full    Dental  (+) Dental Advisory Given   Pulmonary neg pulmonary ROS,    breath sounds clear to auscultation       Cardiovascular negative cardio ROS   Rhythm:Regular Rate:Normal     Neuro/Psych  Headaches, Anxiety    GI/Hepatic Neg liver ROS, GERD  ,  Endo/Other  negative endocrine ROS  Renal/GU negative Renal ROS     Musculoskeletal   Abdominal   Peds  Hematology negative hematology ROS (+)   Anesthesia Other Findings   Reproductive/Obstetrics                            Anesthesia Physical Anesthesia Plan  ASA: II  Anesthesia Plan: General   Post-op Pain Management:    Induction: Intravenous  PONV Risk Score and Plan: 4 or greater and Ondansetron, Dexamethasone, Midazolam, Scopolamine patch - Pre-op and Treatment may vary due to age or medical condition  Airway Management Planned: Oral ETT  Additional Equipment:   Intra-op Plan:   Post-operative Plan: Extubation in OR  Informed Consent: I have reviewed the patients History and Physical, chart, labs and discussed the procedure including the risks, benefits and alternatives for the proposed anesthesia with the patient or authorized representative who has indicated his/her understanding and acceptance.   Dental advisory given  Plan Discussed with: CRNA  Anesthesia Plan Comments:         Anesthesia Quick Evaluation

## 2017-02-12 NOTE — Anesthesia Procedure Notes (Signed)
Procedure Name: Intubation Date/Time: 02/12/2017 12:11 PM Performed by: Noralyn Pick D Pre-anesthesia Checklist: Patient identified, Emergency Drugs available, Suction available and Patient being monitored Patient Re-evaluated:Patient Re-evaluated prior to induction Oxygen Delivery Method: Circle system utilized Preoxygenation: Pre-oxygenation with 100% oxygen Induction Type: IV induction Ventilation: Mask ventilation without difficulty Laryngoscope Size: Mac and 4 Grade View: Grade I Tube type: Oral Number of attempts: 1 Airway Equipment and Method: Stylet Placement Confirmation: ETT inserted through vocal cords under direct vision,  positive ETCO2 and breath sounds checked- equal and bilateral Secured at: 21 cm Tube secured with: Tape Dental Injury: Teeth and Oropharynx as per pre-operative assessment

## 2017-02-12 NOTE — Discharge Instructions (Signed)

## 2017-02-12 NOTE — Brief Op Note (Signed)
02/12/2017  1:34 PM  PATIENT:  [name] Tep  41 y.o. female  PRE-OPERATIVE DIAGNOSIS:  HNP L5-S1 left  POST-OPERATIVE DIAGNOSIS:  HNP L5-S1 left  PROCEDURE:  Procedure(s) with comments: Microlumbar decompression L5-S1 left  (Left) - 90 mins  SURGEON:  Surgeon(s) and Role:    Jene Every* Amritha Yorke, MD - Primary  PHYSICIAN ASSISTANT:   ASSISTANTS: [place]   ANESTHESIA:   general  EBL:  Total I/O In: -  Out: 50 [Blood:50]  BLOOD ADMINISTERED:none  DRAINS: none   LOCAL MEDICATIONS USED:  MARCAINE     SPECIMEN:  No SpecimenL5S1  DISPOSITION OF SPECIMEN:  PATHOLOGY  COUNTS:  YES  TOURNIQUET:  * No tourniquets in log *  DICTATION: .Other Dictation: Dictation Number [id number]  PLAN OF CARE: Admit for overnight observation  PATIENT DISPOSITION:  PACU - hemodynamically stable.   Delay start of Pharmacological VTE agent (>24hrs) due to surgical blood loss or risk of bleeding: yes

## 2017-02-12 NOTE — Transfer of Care (Signed)
Immediate Anesthesia Transfer of Care Note  Patient: Sheila CharonKristi N Mathews  Procedure(s) Performed: Procedure(s) with comments: Microlumbar decompression L5-S1 left  (Left) - 90 mins  Patient Location: PACU  Anesthesia Type:General  Level of Consciousness: awake, alert  and oriented  Airway & Oxygen Therapy: Patient Spontanous Breathing and Patient connected to face mask oxygen  Post-op Assessment: Report given to RN and Post -op Vital signs reviewed and stable  Post vital signs: Reviewed and stable  Last Vitals:  Vitals:   02/12/17 0932  BP: 115/78  Pulse: 79  Resp: 16  Temp: 37.1 C  SpO2: 99%    Last Pain:  Vitals:   02/12/17 1055  TempSrc:   PainSc: 7       Patients Stated Pain Goal: 5 (02/12/17 0948)  Complications: No apparent anesthesia complications

## 2017-02-12 NOTE — Anesthesia Postprocedure Evaluation (Signed)
Anesthesia Post Note  Patient: Sheila Mathews  Procedure(s) Performed: Procedure(s) (LRB): Microlumbar decompression L5-S1 left  (Left)     Patient location during evaluation: PACU Anesthesia Type: General Level of consciousness: awake and alert Pain management: pain level controlled Vital Signs Assessment: post-procedure vital signs reviewed and stable Respiratory status: spontaneous breathing, nonlabored ventilation, respiratory function stable and patient connected to nasal cannula oxygen Cardiovascular status: blood pressure returned to baseline and stable Postop Assessment: no signs of nausea or vomiting Anesthetic complications: no    Last Vitals:  Vitals:   02/12/17 1430 02/12/17 1445  BP: 106/62   Pulse: 75 76  Resp: 11 14  Temp:  36.6 C  SpO2: 99% 100%    Last Pain:  Vitals:   02/12/17 1445  TempSrc:   PainSc: 3                  Kennieth RadFitzgerald, Khian Remo E

## 2017-02-12 NOTE — Interval H&P Note (Signed)
History and Physical Interval Note:  02/12/2017 11:28 AM  Sheila Mathews  has presented today for surgery, with the diagnosis of HNP L5-S1 left  The various methods of treatment have been discussed with the patient and family. After consideration of risks, benefits and other options for treatment, the patient has consented to  Procedure(s) with comments: Microlumbar decompression L5-S1 left  (Left) - 90 mins as a surgical intervention .  The patient's history has been reviewed, patient examined, no change in status, stable for surgery.  I have reviewed the patient's chart and labs.  Questions were answered to the patient's satisfaction.     Satia Winger C

## 2017-02-12 NOTE — H&P (View-Only) (Signed)
Charlotte Renne Musca Marcon is an 41 y.o. female.   Chief Complaint: back and leg pain HPI: The patient is a 41 year old female who presents today for follow up of their back. The patient is being followed for their left-sided back pain and Lt. leg pain. They are now 6 week(s) out from when symptoms began. Symptoms reported today include: pain, aching, burning, leg pain and pain with sitting. The patient states that they are doing poorly. Current treatment includes: physical therapy, NSAIDs and gabapentin, robaxin. The patient reports their current pain level to be 7 / 10. The patient presents today following MRI, ESI and physical therapy. The patient has reported improvement of their symptoms with: activity modification, conservative measures, corticosteroid use, Cortisone injections and NSAIDs. The patient indicates that they have questions or concerns today regarding pain and their progress at this point.  [name] follows up and dramatically worse. Pain radiates down to the outer aspect of her foot. She is here with her husband and kids. She has had these symptoms for six months and now worse as of late. Hydrocodone, Percocet were not helpful to her. The gabapentin, she is taking one three times a day that does not help. We have talked about titrating that upwards.  Past Medical History:  Diagnosis Date  . Anxiety   . Complication of anesthesia    pt doesn't like mask she has panic attack; childhood  . GERD (gastroesophageal reflux disease)   . Headache(784.0)   . Pneumonia   . PONV (postoperative nausea and vomiting)    no issues with short procdures, had one isntance of vomitus following a longer procedure   . Sciatica     Past Surgical History:  Procedure Laterality Date  . CESAREAN SECTION  x2  . LAPAROSCOPY N/A 05/13/2014   Procedure: LAPAROSCOPY DIAGNOSTIC;  Surgeon: Lavina Hammanodd Meisinger, MD;  Location: [facility];  Service: Gynecology;  Laterality: N/A;  . saliva gland removal    . UPPER GI  ENDOSCOPY    . WISDOM TOOTH EXTRACTION      Family History  Problem Relation Age of Onset  . Cancer Maternal Grandmother        breast   . Cancer Maternal Grandfather        head neck   . Diabetes Maternal Grandfather   . Cancer Paternal Grandmother        gallbladder  . Cancer Paternal Grandfather        colon   . Diabetes Paternal Grandfather    Social History:  reports that she has never smoked. She has never used smokeless tobacco. She reports that she drinks alcohol. She reports that she does not use drugs.  Allergies:  Allergies  Allergen Reactions  . Hydrocodone Other (See Comments)    Makes patient aggressive      (Not in a hospital admission)  No results found for this or any previous visit (from the past 48 hour(s)). No results found.  Review of Systems  Constitutional: Negative.   HENT: Negative.   Eyes: Negative.   Respiratory: Negative.   Cardiovascular: Negative.   Gastrointestinal: Negative.   Genitourinary: Negative.   Musculoskeletal: Positive for back pain.  Skin: Negative.   Neurological: Positive for sensory change and focal weakness.    There were no vitals taken for this visit. Physical Exam  Constitutional: She is oriented to person, place, and time. She appears well-developed.  HENT:  Head: Normocephalic.  Eyes: Pupils are equal, round, and reactive to light.  Neck: Normal range of motion.  Cardiovascular: Normal rate.   Respiratory: Effort normal.  GI: Soft.  Musculoskeletal:  On exam, severe distress. Mood and affect appropriate. She walks with slight antalgic gait. Straight leg raise, buttock, thigh and calf pain on the left, negative on the right. EHL is 4+/5 in the left compared to the right. She has diminished repetitive plantar flexion. Slight decrease in Achilles on the left compared to the right. Her sensation though is perhaps slightly altered in the S1 dermatome.  Neurological: She is alert and oriented to person, place, and  time.  Skin: Skin is warm.     MRI demonstrates an interval increase in the disk herniation, now displacing the S1 nerve root. Small disk protrusion of 4-5, noncompressive.  Assessment/Plan 1. 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 2. Disc degeneration, small noncompressive disk protrusion at 4-5. 3. Elevated BMI.  I had extensive discussion concerning current pathology, relevant anatomy and treatment options. Up to 25 minutes dedicated to this discussion, review of her MRI, ttreatment to date, failing conservative treatment, including epidural, physical therapy, activity modification, analgesics, option living with her symptoms versus consideration of microlumbar decompression at L5-S1 on the left to decompress the S1 nerve root, given the S1 radiculopathy. She does have displacement, but not obvious compression noted on her supine MRI, typically in the upright position, where she is worse and with setting, the disk protrudes further and can demonstrate dynamic further neural compression.  She does have radicular symptoms on exam, failing conservative treatment. Again, we discussed lumbar decompressions.  I had an extensive discussion of the risks and benefits of the lumbar decompression with the patient including bleeding, infection, damage to neurovascular structures, epidural fibrosis, CSF leak requiring repair. We also discussed increase in pain, adjacent segment disease, recurrent disc herniation, need for future surgery including repeat decompression and/or fusion. We also discussed risks of postoperative hematoma, paralysis, anesthetic complications including DVT, PE, death, cardiopulmonary dysfunction. In addition, the perioperative and postoperative courses were discussed in detail including the rehabilitative time and return to functional activity and work. I provided the patient with an illustrated  handout and utilized the appropriate surgical models.  Specifically, though, this is a procedure to essentially make more room for the nerve as oppose to ""fix the disc."" If she would have residual disk degeneration, that will require disk pressure management, conservative treatment and strategies to avoid reherniation, which indicated could be 15% chance of that and progressive disc degeneration requiring a fusion. We will try Tylenol with Codeine, which she has had in the past. Titrate up on her gabapentin. We spent considerable time discussing these issues. No DVT. No history of allergy to penicillin or MRA.  Plan: microlumbar decompression L5-S1 left  Dorothy Spark., PA-C for Dr. Shelle Iron 02/11/2017, 8:14 AM

## 2017-02-13 ENCOUNTER — Encounter (HOSPITAL_COMMUNITY): Payer: Self-pay | Admitting: Specialist

## 2017-02-13 DIAGNOSIS — M4807 Spinal stenosis, lumbosacral region: Secondary | ICD-10-CM | POA: Diagnosis not present

## 2017-02-13 DIAGNOSIS — K219 Gastro-esophageal reflux disease without esophagitis: Secondary | ICD-10-CM | POA: Diagnosis not present

## 2017-02-13 DIAGNOSIS — M5127 Other intervertebral disc displacement, lumbosacral region: Secondary | ICD-10-CM | POA: Diagnosis not present

## 2017-02-13 DIAGNOSIS — M5116 Intervertebral disc disorders with radiculopathy, lumbar region: Secondary | ICD-10-CM | POA: Diagnosis not present

## 2017-02-13 DIAGNOSIS — M48061 Spinal stenosis, lumbar region without neurogenic claudication: Secondary | ICD-10-CM | POA: Diagnosis not present

## 2017-02-13 DIAGNOSIS — F419 Anxiety disorder, unspecified: Secondary | ICD-10-CM | POA: Diagnosis not present

## 2017-02-13 NOTE — Progress Notes (Signed)
Discharge planning, no HH needs identified except patient requesting cane. Contacted [facility] to deliver to room. [phone]

## 2017-02-13 NOTE — Progress Notes (Signed)
Subjective: 1 Day Post-Op Procedure(s) (LRB): Microlumbar decompression L5-S1 left  (Left) Patient reports pain as 2 on 0-10 scale.   No leg pain  Objective: Vital signs in last 24 hours: Temp:  [97.8 F (36.6 C)-98.8 F (37.1 C)] 98.7 F (37.1 C) (08/16 0620) Pulse Rate:  [60-82] 64 (08/16 0620) Resp:  [11-20] 16 (08/16 0620) BP: (92-115)/(54-78) 101/55 (08/16 0620) SpO2:  [96 %-100 %] 98 % (08/16 0620) Weight:  [88.5 kg (195 lb)] 88.5 kg (195 lb) (08/15 0948)  Intake/Output from previous day: 08/15 0701 - 08/16 0700 In: 2442.5 [P.O.:480; I.V.:1762.5; IV Piggyback:200] Out: 3050 [Urine:2400; Stool:600; Blood:50] Intake/Output this shift: No intake/output data recorded.  No results for input(s): HGB in the last 72 hours. No results for input(s): WBC, RBC, HCT, PLT in the last 72 hours. No results for input(s): NA, K, CL, CO2, BUN, CREATININE, GLUCOSE, CALCIUM in the last 72 hours. No results for input(s): LABPT, INR in the last 72 hours.  Neurologically intact Neurovascular intact Intact pulses distally Dorsiflexion/Plantar flexion intact Incision: dressing C/D/I No DVT  Assessment/Plan: 1 Day Post-Op Procedure(s) (LRB): Microlumbar decompression L5-S1 left  (Left) Advance diet Up with therapy D/C IV fluids Discharge home with home health  D/C instr given  Shakeera Rightmyer C 02/13/2017, 7:25 AM

## 2017-02-13 NOTE — Op Note (Signed)
NAMEMERISSA, RENWICK NO.:  [id number]  MEDICAL RECORD NO.:  [id number]  LOCATION:                                 FACILITY:  PHYSICIAN:  Sheila Mathews, M.D.         DATE OF BIRTH:  DATE OF PROCEDURE:  02/12/2017 DATE OF DISCHARGE:                              OPERATIVE REPORT   PREOPERATIVE DIAGNOSIS:  Spinal stenosis, herniated nucleus pulposus, L5- S1, left.  POSTOPERATIVE DIAGNOSIS:  Spinal stenosis, herniated nucleus pulposus, L5-S1, left.  PROCEDURES PERFORMED: 1. Microlumbar decompression, L5-S1, left. 2. Foraminotomy, L5-S1, left. 3. Microdiskectomy, L5-S1, left.  ANESTHESIA:  General.  ASSISTANT:  Lanna Poche, PA.  SPECIMEN:  L5-S1 disk to Pathology.  HISTORY:  A 40, S1 radiculopathy, L5, secondary to disk herniation, progressive, indicated for microlumbar decompression at L5-S1.  Risks and benefits were discussed including bleeding, infection, damage to neurovascular structures, no change in symptoms, worsening symptoms, DVT, PE, anesthetic complications, etc.  EHL weakness and neural tension signs.  Refractory to conservative treatment.  TECHNIQUE:  With the patient in supine position after induction of adequate general anesthesia, 2 g of Kefzol, placed prone on the [place] frame.  All bony prominences were well padded.  Lumbar region was prepped and draped in usual sterile fashion.  Two 18-gauge spinal needles were utilized to localize 5-1 interspace.  She had a scoliosis. She had a transitional segment with S1-S2 disk.  The last opened disk space was seen and correlating to the MRI on the preoperative x-rays with disk space above that.  This was labeled L5-S1, where she had the disk herniation.  We identified.  After making that incision, we made a small incision in the paraspinous dorsolumbar fascia, elevated the musculature, placed a self-retaining retractor.  Confirmatory radiograph obtained.  Operating microscope was draped  and brought on the surgical field.  Microcurette utilized to detach ligamentum flavum from the cephalad edge of S1.  We then placed a neuro patty beneath the lamina and ligamentum to protect the neural elements.  A generous foraminotomy of S1 was performed.  We then decompressed the lateral recess to the medial border of the pedicle.  Identified the S1 nerve root, gently mobilized it medially.  Focal HNP was noted.  There was ligamentum flavum hypertrophy and stenosis compressing the S1 nerve root in the lateral recess.  There was epidural venous plexus noted as well.  This was cauterized.  After confirmatory radiograph, I performed an annulotomy and the disk herniation was immediately expressed.  Full diskectomy and multiple passes were made with multiple fragments retrieved with a micropituitary, straight pituitary, further mobilized with an Epstein, preserving the endplates.  Following this, we irrigated with antibiotic irrigation lavage, additional fragments retrieved.  A 1 cm of excursion, the S1 nerve root medial to pedicle was then noted, without tension.  I checked in the axilla of the root, the shoulder of the root at the foramen L5 and S1, no residual disk herniation.  We performed foraminotomy of 5 as well just the superior articulating process, was slightly ligamentum flavum into the L5 foramen, protecting the 5 root.  Thrombin-soaked Gelfoam was placed, then removed.  No active bleeding  or CSF leakage.  We irrigated copiously and then placed thrombin-soaked Gelfoam in the laminotomy defect.  We removed the [facility] retractor, irrigated the paraspinous musculature.  No active bleeding.  We closed the dorsolumbar fascia with 1 Vicryl, subcu with 2 and skin with Prolene.  Sterile dressing applied.  Placed supine on the hospital bed, extubated without difficulty and transported to the recovery room in satisfactory condition.  The patient tolerated the procedure well.  No  complications.  Assistant, Lanna PocheJacqueline Bissell, [place].  Minimal blood loss.     Sheila EveryJeffrey Mayra Mathews, M.D.   ______________________________ Sheila EveryJeffrey Kelyse Mathews, M.D.    Cordelia PenJB/MEDQ  D:  02/12/2017  T:  02/12/2017  Job:  [id number]

## 2017-02-13 NOTE — Evaluation (Signed)
Occupational Therapy Evaluation Patient Details Name: Sheila CharonKristi N Mathews MRN: [id number] DOB: 04/10/1976 Today's Date: 02/13/2017    History of Present Illness s/p decomp L5-S1   Clinical Impression   OT education complete.  Spouse present for education    Follow Up Recommendations  No OT follow up    Equipment Recommendations  None recommended by OT       Precautions / Restrictions Precautions Precautions: Back Precaution Comments: reviewed BLT Restrictions Weight Bearing Restrictions: No      Mobility Bed Mobility Overal bed mobility: Modified Independent             General bed mobility comments: pt demo's log roll and s/l to sit light use of rail to roll  Transfers Overall transfer level: Needs assistance   Transfers: Sit to/from Stand Sit to Stand: Supervision;Modified independent (Device/Increase time)                  ADL either performed or assessed with clinical judgement   ADL                                         General ADL Comments: pt overall S - min A with ADL activity at this time. Educated on back precautions and AE. Spouse will A as needed.       Vision Patient Visual Report: No change from baseline       Perception     Praxis      Pertinent Vitals/Pain Pain Assessment: 0-10 Pain Score: 5  Pain Location: back  Pain Descriptors / Indicators: Sore Pain Intervention(s): Limited activity within patient's tolerance;Repositioned;Patient requesting pain meds-RN notified     Hand Dominance     Extremity/Trunk Assessment Upper Extremity Assessment Upper Extremity Assessment: Overall WFL for tasks assessed;Defer to OT evaluation   Lower Extremity Assessment Lower Extremity Assessment: LLE deficits/detail LLE Deficits / Details: no deficits noted per functional activities; pt reports fatigue/weakness especially with incr amb/standing prior to surgery        Communication Communication Communication: No  difficulties   Cognition Arousal/Alertness: Awake/alert Behavior During Therapy: WFL for tasks assessed/performed Overall Cognitive Status: Within Functional Limits for tasks assessed                                     General Comments   Educated on ways to follow and protect back in her work environment            Home Living Family/patient expects to be discharged to:: Private residence Living Arrangements: Spouse/significant other;Children   Type of Home: House Home Access: Stairs to enter Secretary/administratorntrance Stairs-Number of Steps: 4   Home Layout: One level     Bathroom Shower/Tub: Runner, broadcasting/film/videoWalk-in shower         Home Equipment: None          Prior Functioning/Environment Level of Independence: Independent                          OT Goals(Current goals can be found in the care plan section) Acute Rehab OT Goals Patient Stated Goal: no back pain  OT Frequency:                AM-PAC PT "6 Clicks" Daily Activity     Outcome Measure Help from  another person eating meals?: None Help from another person taking care of personal grooming?: None Help from another person toileting, which includes using toliet, bedpan, or urinal?: None Help from another person bathing (including washing, rinsing, drying)?: A Little Help from another person to put on and taking off regular upper body clothing?: None Help from another person to put on and taking off regular lower body clothing?: A Little 6 Click Score: 22   End of Session Nurse Communication: Mobility status  Activity Tolerance: Patient tolerated treatment well Patient left: in bed;with family/visitor present;with call bell/phone within reach  OT Visit Diagnosis: Muscle weakness (generalized) (M62.81)                Time: 1010-1040 OT Time Calculation (min): 30 min Charges:  OT Evaluation $OT Eval Low Complexity: 1 Procedure OT Treatments $Self Care/Home Management : 8-22 mins G-Codes: OT G-codes  **NOT FOR INPATIENT CLASS** Functional Assessment Tool Used: Clinical judgement Functional Limitation: Self care Self Care Current Status (V4098): At least 20 percent but less than 40 percent impaired, limited or restricted Self Care Goal Status (J1914): At least 1 percent but less than 20 percent impaired, limited or restricted Self Care Discharge Status [phone]): At least 1 percent but less than 20 percent impaired, limited or restricted   Lise Auer, [place] [phone]   Einar Crow D 02/13/2017, 11:20 AM

## 2017-02-13 NOTE — Discharge Summary (Signed)
Physician Discharge Summary   Patient ID: Sheila Mathews MRN: [id number] DOB/AGE: May 24, 1976 41 y.o.  Admit date: 02/12/2017 Discharge date: 02/13/2017  Primary Diagnosis:   HNP L5-S1 left  Admission Diagnoses:  Past Medical History:  Diagnosis Date  . Anxiety   . Complication of anesthesia    pt doesn't like mask she has panic attack; childhood  . GERD (gastroesophageal reflux disease)   . Headache(784.0)   . Pneumonia   . PONV (postoperative nausea and vomiting)    no issues with short procdures, had one isntance of vomitus following a longer procedure   . Sciatica    Discharge Diagnoses:   Principal Problem:   HNP (herniated nucleus pulposus), lumbar  Procedure:  Procedure(s) (LRB): Microlumbar decompression L5-S1 left  (Left)   Consults: None  HPI:  see H&P    Laboratory Data: Hospital Outpatient Visit on 02/05/2017  Component Date Value Ref Range Status  . Preg, Serum 02/05/2017 NEGATIVE  NEGATIVE Final   Comment:        THE SENSITIVITY OF THIS METHODOLOGY IS >10 mIU/mL.   Marland Kitchen Sodium 02/05/2017 140  135 - 145 mmol/L Final  . Potassium 02/05/2017 4.2  3.5 - 5.1 mmol/L Final  . Chloride 02/05/2017 105  101 - 111 mmol/L Final  . CO2 02/05/2017 25  22 - 32 mmol/L Final  . Glucose, Bld 02/05/2017 91  65 - 99 mg/dL Final  . BUN 02/05/2017 12  6 - 20 mg/dL Final  . Creatinine, Ser 02/05/2017 0.58  0.44 - 1.00 mg/dL Final  . Calcium 02/05/2017 9.3  8.9 - 10.3 mg/dL Final  . GFR calc non Af Amer 02/05/2017 >60  >60 mL/min Final  . GFR calc Af Amer 02/05/2017 >60  >60 mL/min Final   Comment: (NOTE) The eGFR has been calculated using the CKD EPI equation. This calculation has not been validated in all clinical situations. eGFR's persistently <60 mL/min signify possible Chronic Kidney Disease.   . Anion gap 02/05/2017 10  5 - 15 Final  . WBC 02/05/2017 7.5  4.0 - 10.5 K/uL Final  . RBC 02/05/2017 4.12  3.87 - 5.11 MIL/uL Final  . Hemoglobin 02/05/2017 11.9* 12.0  - 15.0 g/dL Final  . HCT 02/05/2017 36.1  36.0 - 46.0 % Final  . MCV 02/05/2017 87.6  78.0 - 100.0 fL Final  . MCH 02/05/2017 28.9  26.0 - 34.0 pg Final  . MCHC 02/05/2017 33.0  30.0 - 36.0 g/dL Final  . RDW 02/05/2017 13.5  11.5 - 15.5 % Final  . Platelets 02/05/2017 402* 150 - 400 K/uL Final  . MRSA, PCR 02/05/2017 NEGATIVE  NEGATIVE Final  . Staphylococcus aureus 02/05/2017 NEGATIVE  NEGATIVE Final   Comment:        The Xpert SA Assay (FDA approved for NASAL specimens in patients over 36 years of age), is one component of a comprehensive surveillance program.  Test performance has been validated by [facility] for patients greater than or equal to 33 year old. It is not intended to diagnose infection nor to guide or monitor treatment.    No results for input(s): HGB in the last 72 hours. No results for input(s): WBC, RBC, HCT, PLT in the last 72 hours. No results for input(s): NA, K, CL, CO2, BUN, CREATININE, GLUCOSE, CALCIUM in the last 72 hours. No results for input(s): LABPT, INR in the last 72 hours.  X-Rays:Dg Lumbar Spine 2-3 Views  Addendum Date: 02/11/2017   ADDENDUM REPORT: 02/11/2017 22:04 ADDENDUM: Per  clinician's request, vertebral bodies have been numbered. Electronically Signed   By: Elon Alas M.D.   On: 02/11/2017 22:04   Result Date: 02/11/2017 CLINICAL DATA:  Herniated nucleus pulposus. EXAM: LUMBAR SPINE - 2-3 VIEW COMPARISON:  None. FINDINGS: There is no evidence of lumbar spine fracture. Transitional anatomy, lumbarized RIGHT S1. Mild levoscoliosis lower lumbar spine. Alignment is normal. Intervertebral disc spaces are maintained. IMPRESSION: Mild levoscoliosis lower lumbar spine. No fracture deformity or malalignment. Electronically Signed: By: Elon Alas M.D. On: 02/06/2017 04:13   Dg Spine Portable 1 View  Result Date: 02/12/2017 CLINICAL DATA:  Lumbar disc disease. EXAM: PORTABLE SPINE - 1 VIEW COMPARISON:  02/05/2017 FINDINGS: Instrument  is at the L5-S1 level. There is lumbarization of the S1 segment. IMPRESSION: Instruments at L5-S1 as compared to the numbering sequence on 02/05/2017. Electronically Signed   By: Lorriane Shire M.D.   On: 02/12/2017 13:31   Dg Spine Portable 1 View  Result Date: 02/12/2017 CLINICAL DATA:  Lumbar disc disease. EXAM: PORTABLE SPINE - 1 VIEW COMPARISON:  02/05/2017 and 02/12/2017 FINDINGS: Image 2 demonstrates an instrument between the spinous processes of L5 and S1, using the numbering terminology used on the study of 02/05/2017. IMPRESSION: Instruments at L5-S1. Electronically Signed   By: Lorriane Shire M.D.   On: 02/12/2017 13:00   Dg Spine Portable 1 View  Result Date: 02/12/2017 CLINICAL DATA:  Lumbar spine surgery. EXAM: PORTABLE SPINE - 1 VIEW COMPARISON:  02/05/2017. FINDINGS: Lumbar spine numbered as per prior exam. Metallic markers are noted posteriorly at the L4-L5 and L5-S1 disc space levels. IMPRESSION: Postsurgical changes lumbar spine as above. Electronically Signed   By: Marcello Moores  Register   On: 02/12/2017 12:45    EKG:No orders found for this or any previous visit.   Hospital Course: Patient was admitted to [facility] and taken to the OR and underwent the above state procedure without complications.  Patient tolerated the procedure well and was later transferred to the recovery room and then to the orthopaedic floor for postoperative care.  They were given PO and IV analgesics for pain control following their surgery.  They were given 24 hours of postoperative antibiotics.   PT was consulted postop to assist with mobility and transfers.  The patient was allowed to be WBAT with therapy and was taught back precautions. Discharge planning was consulted to help with postop disposition and equipment needs.  Patient had a good night on the evening of surgery and started to get up OOB with therapy on day one. Patient was seen in rounds and was ready to go home on day one.  They were  given discharge instructions and dressing directions.  They were instructed on when to follow up in the office with Dr. Tonita Cong.   Diet: Regular diet Activity:WBAT; Lspine precautions Follow-up:in 10-14 days Disposition - Home Discharged Condition: good   Discharge Instructions    Call MD / Call 911    Complete by:  As directed    If you experience chest pain or shortness of breath, CALL 911 and be transported to the hospital emergency room.  If you develope a fever above 101 F, pus (white drainage) or increased drainage or redness at the wound, or calf pain, call your surgeon's office.   Constipation Prevention    Complete by:  As directed    Drink plenty of fluids.  Prune juice may be helpful.  You may use a stool softener, such as Colace (over the counter) 100  mg twice a day.  Use MiraLax (over the counter) for constipation as needed.   Diet - low sodium heart healthy    Complete by:  As directed    Increase activity slowly as tolerated    Complete by:  As directed      Allergies as of 02/13/2017      Reactions   Hydrocodone Other (See Comments)   Makes patient aggressive       Medication List    STOP taking these medications   naproxen sodium 220 MG tablet Commonly known as:  ANAPROX     TAKE these medications   acetaminophen 500 MG tablet Commonly known as:  TYLENOL Take 500 mg by mouth every 6 (six) hours as needed.   acetaminophen-codeine 300-30 MG tablet Commonly known as:  TYLENOL #3 Take 1-2 tablets by mouth every 4 (four) hours as needed for moderate pain. What changed:  how much to take  when to take this  reasons to take this   ALPRAZolam 1 MG tablet Commonly known as:  XANAX Take 1 mg by mouth at bedtime as needed for anxiety or sleep.   amphetamine-dextroamphetamine 20 MG 24 hr capsule Commonly known as:  ADDERALL XR Take 20 mg by mouth daily.   Azelastine HCl 0.15 % Soln Place 1 puff into both nostrils 2 (two) times daily as needed. Sinus  issues   docusate sodium 100 MG capsule Commonly known as:  COLACE Take 1 capsule (100 mg total) by mouth 2 (two) times daily as needed for mild constipation.   DULoxetine 60 MG capsule Commonly known as:  CYMBALTA Take 60 mg by mouth daily.   eletriptan 40 MG tablet Commonly known as:  RELPAX Take 40 mg by mouth as needed for migraine or headache. One tablet by mouth at onset of headache. May repeat in 2 hours if headache persists or recurs.   esomeprazole 20 MG capsule Commonly known as:  NEXIUM Take 20 mg by mouth daily at 12 noon.   gabapentin 300 MG capsule Commonly known as:  NEURONTIN Take 900 mg by mouth 2 (two) times daily.   levocetirizine 5 MG tablet Commonly known as:  XYZAL Take 5 mg by mouth daily.   methocarbamol 500 MG tablet Commonly known as:  ROBAXIN Take 1 tablet (500 mg total) by mouth every 6 (six) hours as needed for muscle spasms.   montelukast 10 MG tablet Commonly known as:  SINGULAIR Take 10 mg by mouth every evening.   polyethylene glycol packet Commonly known as:  MIRALAX / GLYCOLAX Take 17 g by mouth daily.   PROBIOTIC PO Take 1 capsule by mouth daily.      Follow-up Information    Susa Day, MD Follow up in 2 week(s).   Specialty:  Orthopedic Surgery Contact information: [address] [place] [address] [phone]           Signed: Lacie Draft, PA-C Orthopaedic Surgery 02/13/2017, 7:42 AM

## 2017-02-13 NOTE — Evaluation (Signed)
Physical Therapy Evaluation Patient Details Name: Sheila Mathews MRN: [id number] DOB: [birth year] Today's Date: 02/13/2017   History of Present Illness  s/p decomp L5-S1  Clinical Impression  Patient evaluated by Physical Therapy with no further acute PT needs identified. All education has been completed and the patient has no further questions. * See below for any follow-up Physical Therapy or equipment needs. PT is signing off. Thank you for this referral. Reviewed back precautions, appropriate footwear for amb/incr activity; reviewed correct use of SPC    Follow Up Recommendations No PT follow up    Equipment Recommendations  Cane (pt requests cane d/t RLE fatigue with incr activity)    Recommendations for Other Services       Precautions / Restrictions Precautions Precautions: Back Precaution Comments: reviewed BLT      Mobility  Bed Mobility Overal bed mobility: Modified Independent             General bed mobility comments: pt demo's log roll and s/l to sit light use of rail to roll  Transfers Overall transfer level: Needs assistance   Transfers: Sit to/from Stand Sit to Stand: Supervision;Modified independent (Device/Increase time)            Ambulation/Gait Ambulation/Gait assistance: Supervision;Modified independent (Device/Increase time) Ambulation Distance (Feet): 400 Feet Assistive device: None Gait Pattern/deviations: Step-through pattern;Decreased stride length;Wide base of support        Stairs Stairs: Yes Stairs assistance: Min guard Stair Management: One rail Left;Step to pattern Number of Stairs: 4 General stair comments: cues for sequence, min/guard for safety  Wheelchair Mobility    Modified Rankin (Stroke Patients Only)       Balance                                             Pertinent Vitals/Pain Pain Assessment: 0-10 Pain Score: 5  Pain Location: back  Pain Descriptors / Indicators: Sore Pain  Intervention(s): Limited activity within patient's tolerance;Monitored during session    Home Living Family/patient expects to be discharged to:: Private residence Living Arrangements: Spouse/significant other;Children   Type of Home: House Home Access: Stairs to enter   Secretary/administrator of Steps: 4 Home Layout: One level Home Equipment: None      Prior Function Level of Independence: Independent               Hand Dominance        Extremity/Trunk Assessment   Upper Extremity Assessment Upper Extremity Assessment: Overall WFL for tasks assessed;Defer to OT evaluation    Lower Extremity Assessment Lower Extremity Assessment: LLE deficits/detail LLE Deficits / Details: no deficits noted per functional activities; pt reports fatigue/weakness especially with incr amb/standing prior to surgery        Communication   Communication: No difficulties  Cognition Arousal/Alertness: Awake/alert Behavior During Therapy: WFL for tasks assessed/performed Overall Cognitive Status: Within Functional Limits for tasks assessed                                        General Comments      Exercises     Assessment/Plan    PT Assessment Patent does not need any further PT services  PT Problem List Decreased strength;Decreased activity tolerance;Decreased mobility       PT Treatment Interventions  PT Goals (Current goals can be found in the Care Plan section)  Acute Rehab PT Goals Patient Stated Goal: no back pain PT Goal Formulation: All assessment and education complete, DC therapy    Frequency     Barriers to discharge        Co-evaluation               AM-PAC PT "6 Clicks" Daily Activity  Outcome Measure Difficulty turning over in bed (including adjusting bedclothes, sheets and blankets)?: None Difficulty moving from lying on back to sitting on the side of the bed? : None Difficulty sitting down on and standing up from a chair  with arms (e.g., wheelchair, bedside commode, etc,.)?: None Help needed moving to and from a bed to chair (including a wheelchair)?: None Help needed walking in hospital room?: None Help needed climbing 3-5 steps with a railing? : A Little 6 Click Score: 23    End of Session   Activity Tolerance: Patient tolerated treatment well Patient left: with call bell/phone within reach   PT Visit Diagnosis: Difficulty in walking, not elsewhere classified (R26.2)    Time: [id number] PT Time Calculation (min) (ACUTE ONLY): 25 min   Charges:   PT Evaluation $PT Eval Low Complexity: 1 Low PT Treatments $Gait Training: 8-22 mins   PT G Codes:   PT G-Codes **NOT FOR INPATIENT CLASS** Functional Assessment Tool Used: AM-PAC 6 Clicks Basic Mobility;Clinical judgement Functional Limitation: Mobility: Walking and moving around Mobility: Walking and Moving Around Current Status ([id number](G8978): At least 1 percent but less than 20 percent impaired, limited or restricted Mobility: Walking and Moving Around Goal Status [phone](G8979): 0 percent impaired, limited or restricted Mobility: Walking and Moving Around Discharge Status [phone](G8980): 0 percent impaired, limited or restricted    Drucilla Chaletara Makennah Omura, PT Pager: [phone] 02/13/2017   [facility] CenterWILLIAMS,Gumecindo Hopkin 02/13/2017, 10:21 AM

## 2017-03-06 DIAGNOSIS — M545 Low back pain: Secondary | ICD-10-CM | POA: Diagnosis not present

## 2017-03-06 DIAGNOSIS — G8929 Other chronic pain: Secondary | ICD-10-CM | POA: Diagnosis not present

## 2017-03-10 DIAGNOSIS — M545 Low back pain: Secondary | ICD-10-CM | POA: Diagnosis not present

## 2017-03-10 DIAGNOSIS — G8929 Other chronic pain: Secondary | ICD-10-CM | POA: Diagnosis not present

## 2017-03-13 DIAGNOSIS — G8929 Other chronic pain: Secondary | ICD-10-CM | POA: Diagnosis not present

## 2017-03-13 DIAGNOSIS — M545 Low back pain: Secondary | ICD-10-CM | POA: Diagnosis not present

## 2017-03-18 DIAGNOSIS — M545 Low back pain: Secondary | ICD-10-CM | POA: Diagnosis not present

## 2017-03-18 DIAGNOSIS — G8929 Other chronic pain: Secondary | ICD-10-CM | POA: Diagnosis not present

## 2017-03-21 DIAGNOSIS — G8929 Other chronic pain: Secondary | ICD-10-CM | POA: Diagnosis not present

## 2017-03-21 DIAGNOSIS — M545 Low back pain: Secondary | ICD-10-CM | POA: Diagnosis not present

## 2017-03-24 DIAGNOSIS — F5101 Primary insomnia: Secondary | ICD-10-CM | POA: Diagnosis not present

## 2017-03-25 DIAGNOSIS — M545 Low back pain: Secondary | ICD-10-CM | POA: Diagnosis not present

## 2017-03-25 DIAGNOSIS — G8929 Other chronic pain: Secondary | ICD-10-CM | POA: Diagnosis not present

## 2017-03-27 DIAGNOSIS — M545 Low back pain: Secondary | ICD-10-CM | POA: Diagnosis not present

## 2017-03-27 DIAGNOSIS — G8929 Other chronic pain: Secondary | ICD-10-CM | POA: Diagnosis not present

## 2017-04-16 DIAGNOSIS — J0101 Acute recurrent maxillary sinusitis: Secondary | ICD-10-CM | POA: Diagnosis not present

## 2017-04-16 DIAGNOSIS — F329 Major depressive disorder, single episode, unspecified: Secondary | ICD-10-CM | POA: Diagnosis not present

## 2017-04-20 DIAGNOSIS — J019 Acute sinusitis, unspecified: Secondary | ICD-10-CM | POA: Diagnosis not present

## 2017-06-05 DIAGNOSIS — M62838 Other muscle spasm: Secondary | ICD-10-CM | POA: Diagnosis not present

## 2017-06-05 DIAGNOSIS — J0100 Acute maxillary sinusitis, unspecified: Secondary | ICD-10-CM | POA: Diagnosis not present

## 2017-06-13 DIAGNOSIS — Z5181 Encounter for therapeutic drug level monitoring: Secondary | ICD-10-CM | POA: Diagnosis not present

## 2017-06-13 DIAGNOSIS — E785 Hyperlipidemia, unspecified: Secondary | ICD-10-CM | POA: Diagnosis not present

## 2017-06-13 DIAGNOSIS — E039 Hypothyroidism, unspecified: Secondary | ICD-10-CM | POA: Diagnosis not present

## 2017-06-13 DIAGNOSIS — R739 Hyperglycemia, unspecified: Secondary | ICD-10-CM | POA: Diagnosis not present

## 2017-06-30 DIAGNOSIS — R1310 Dysphagia, unspecified: Secondary | ICD-10-CM | POA: Diagnosis not present

## 2017-06-30 DIAGNOSIS — K219 Gastro-esophageal reflux disease without esophagitis: Secondary | ICD-10-CM | POA: Diagnosis not present

## 2017-06-30 DIAGNOSIS — F458 Other somatoform disorders: Secondary | ICD-10-CM | POA: Diagnosis not present

## 2017-07-08 DIAGNOSIS — R1310 Dysphagia, unspecified: Secondary | ICD-10-CM | POA: Diagnosis not present

## 2017-07-08 DIAGNOSIS — K2970 Gastritis, unspecified, without bleeding: Secondary | ICD-10-CM | POA: Diagnosis not present

## 2017-07-08 DIAGNOSIS — K2960 Other gastritis without bleeding: Secondary | ICD-10-CM | POA: Diagnosis not present

## 2017-07-08 DIAGNOSIS — K228 Other specified diseases of esophagus: Secondary | ICD-10-CM | POA: Diagnosis not present

## 2017-07-10 DIAGNOSIS — J329 Chronic sinusitis, unspecified: Secondary | ICD-10-CM | POA: Diagnosis not present

## 2017-07-10 DIAGNOSIS — K219 Gastro-esophageal reflux disease without esophagitis: Secondary | ICD-10-CM | POA: Insufficient documentation

## 2017-07-10 DIAGNOSIS — R0982 Postnasal drip: Secondary | ICD-10-CM | POA: Diagnosis not present

## 2017-07-15 DIAGNOSIS — J329 Chronic sinusitis, unspecified: Secondary | ICD-10-CM | POA: Diagnosis not present

## 2017-07-15 DIAGNOSIS — J320 Chronic maxillary sinusitis: Secondary | ICD-10-CM | POA: Diagnosis not present

## 2017-07-23 DIAGNOSIS — M5416 Radiculopathy, lumbar region: Secondary | ICD-10-CM | POA: Diagnosis not present

## 2017-07-23 DIAGNOSIS — M545 Low back pain: Secondary | ICD-10-CM | POA: Diagnosis not present

## 2017-08-01 DIAGNOSIS — M5136 Other intervertebral disc degeneration, lumbar region: Secondary | ICD-10-CM | POA: Insufficient documentation

## 2017-08-01 DIAGNOSIS — M5126 Other intervertebral disc displacement, lumbar region: Secondary | ICD-10-CM | POA: Insufficient documentation

## 2017-08-01 DIAGNOSIS — Z9089 Acquired absence of other organs: Secondary | ICD-10-CM | POA: Diagnosis not present

## 2017-08-01 DIAGNOSIS — Z9889 Other specified postprocedural states: Secondary | ICD-10-CM | POA: Insufficient documentation

## 2017-08-11 DIAGNOSIS — F411 Generalized anxiety disorder: Secondary | ICD-10-CM | POA: Diagnosis not present

## 2017-08-18 DIAGNOSIS — F3170 Bipolar disorder, currently in remission, most recent episode unspecified: Secondary | ICD-10-CM | POA: Diagnosis not present

## 2017-08-18 DIAGNOSIS — F4310 Post-traumatic stress disorder, unspecified: Secondary | ICD-10-CM | POA: Diagnosis not present

## 2017-08-18 DIAGNOSIS — F408 Other phobic anxiety disorders: Secondary | ICD-10-CM | POA: Diagnosis not present

## 2017-08-21 DIAGNOSIS — F3170 Bipolar disorder, currently in remission, most recent episode unspecified: Secondary | ICD-10-CM | POA: Diagnosis not present

## 2017-08-21 DIAGNOSIS — F408 Other phobic anxiety disorders: Secondary | ICD-10-CM | POA: Diagnosis not present

## 2017-08-21 DIAGNOSIS — F4310 Post-traumatic stress disorder, unspecified: Secondary | ICD-10-CM | POA: Diagnosis not present

## 2017-08-25 DIAGNOSIS — F3170 Bipolar disorder, currently in remission, most recent episode unspecified: Secondary | ICD-10-CM | POA: Diagnosis not present

## 2017-08-25 DIAGNOSIS — F408 Other phobic anxiety disorders: Secondary | ICD-10-CM | POA: Diagnosis not present

## 2017-08-25 DIAGNOSIS — F4310 Post-traumatic stress disorder, unspecified: Secondary | ICD-10-CM | POA: Diagnosis not present

## 2017-09-03 DIAGNOSIS — K219 Gastro-esophageal reflux disease without esophagitis: Secondary | ICD-10-CM | POA: Diagnosis not present

## 2017-09-04 DIAGNOSIS — F408 Other phobic anxiety disorders: Secondary | ICD-10-CM | POA: Diagnosis not present

## 2017-09-04 DIAGNOSIS — F4310 Post-traumatic stress disorder, unspecified: Secondary | ICD-10-CM | POA: Diagnosis not present

## 2017-09-04 DIAGNOSIS — F3170 Bipolar disorder, currently in remission, most recent episode unspecified: Secondary | ICD-10-CM | POA: Diagnosis not present

## 2017-09-08 DIAGNOSIS — H6690 Otitis media, unspecified, unspecified ear: Secondary | ICD-10-CM | POA: Diagnosis not present

## 2017-09-08 DIAGNOSIS — R509 Fever, unspecified: Secondary | ICD-10-CM | POA: Diagnosis not present

## 2017-09-11 DIAGNOSIS — Z79899 Other long term (current) drug therapy: Secondary | ICD-10-CM | POA: Diagnosis not present

## 2017-09-11 DIAGNOSIS — F3170 Bipolar disorder, currently in remission, most recent episode unspecified: Secondary | ICD-10-CM | POA: Diagnosis not present

## 2017-09-18 DIAGNOSIS — F3170 Bipolar disorder, currently in remission, most recent episode unspecified: Secondary | ICD-10-CM | POA: Diagnosis not present

## 2017-09-18 DIAGNOSIS — F4310 Post-traumatic stress disorder, unspecified: Secondary | ICD-10-CM | POA: Diagnosis not present

## 2017-09-18 DIAGNOSIS — F408 Other phobic anxiety disorders: Secondary | ICD-10-CM | POA: Diagnosis not present

## 2017-09-23 DIAGNOSIS — F3170 Bipolar disorder, currently in remission, most recent episode unspecified: Secondary | ICD-10-CM | POA: Diagnosis not present

## 2017-09-23 DIAGNOSIS — F4310 Post-traumatic stress disorder, unspecified: Secondary | ICD-10-CM | POA: Diagnosis not present

## 2017-09-23 DIAGNOSIS — F408 Other phobic anxiety disorders: Secondary | ICD-10-CM | POA: Diagnosis not present

## 2017-09-26 DIAGNOSIS — R05 Cough: Secondary | ICD-10-CM | POA: Diagnosis not present

## 2017-10-06 DIAGNOSIS — F3170 Bipolar disorder, currently in remission, most recent episode unspecified: Secondary | ICD-10-CM | POA: Diagnosis not present

## 2017-10-06 DIAGNOSIS — F408 Other phobic anxiety disorders: Secondary | ICD-10-CM | POA: Diagnosis not present

## 2017-10-06 DIAGNOSIS — F4310 Post-traumatic stress disorder, unspecified: Secondary | ICD-10-CM | POA: Diagnosis not present

## 2017-10-21 DIAGNOSIS — F4310 Post-traumatic stress disorder, unspecified: Secondary | ICD-10-CM | POA: Diagnosis not present

## 2017-10-21 DIAGNOSIS — F3170 Bipolar disorder, currently in remission, most recent episode unspecified: Secondary | ICD-10-CM | POA: Diagnosis not present

## 2017-10-21 DIAGNOSIS — F408 Other phobic anxiety disorders: Secondary | ICD-10-CM | POA: Diagnosis not present

## 2017-10-22 DIAGNOSIS — F408 Other phobic anxiety disorders: Secondary | ICD-10-CM | POA: Diagnosis not present

## 2017-10-22 DIAGNOSIS — F4310 Post-traumatic stress disorder, unspecified: Secondary | ICD-10-CM | POA: Diagnosis not present

## 2017-10-22 DIAGNOSIS — F3170 Bipolar disorder, currently in remission, most recent episode unspecified: Secondary | ICD-10-CM | POA: Diagnosis not present

## 2017-11-05 DIAGNOSIS — F4310 Post-traumatic stress disorder, unspecified: Secondary | ICD-10-CM | POA: Diagnosis not present

## 2017-11-05 DIAGNOSIS — F408 Other phobic anxiety disorders: Secondary | ICD-10-CM | POA: Diagnosis not present

## 2017-11-05 DIAGNOSIS — F3170 Bipolar disorder, currently in remission, most recent episode unspecified: Secondary | ICD-10-CM | POA: Diagnosis not present

## 2017-11-20 DIAGNOSIS — F4310 Post-traumatic stress disorder, unspecified: Secondary | ICD-10-CM | POA: Diagnosis not present

## 2017-11-20 DIAGNOSIS — F408 Other phobic anxiety disorders: Secondary | ICD-10-CM | POA: Diagnosis not present

## 2017-11-20 DIAGNOSIS — F3170 Bipolar disorder, currently in remission, most recent episode unspecified: Secondary | ICD-10-CM | POA: Diagnosis not present

## 2017-12-18 DIAGNOSIS — F408 Other phobic anxiety disorders: Secondary | ICD-10-CM | POA: Diagnosis not present

## 2017-12-18 DIAGNOSIS — F4310 Post-traumatic stress disorder, unspecified: Secondary | ICD-10-CM | POA: Diagnosis not present

## 2017-12-18 DIAGNOSIS — F3170 Bipolar disorder, currently in remission, most recent episode unspecified: Secondary | ICD-10-CM | POA: Diagnosis not present

## 2017-12-30 DIAGNOSIS — R300 Dysuria: Secondary | ICD-10-CM | POA: Diagnosis not present

## 2018-01-05 ENCOUNTER — Other Ambulatory Visit: Payer: Self-pay | Admitting: Family Medicine

## 2018-01-05 ENCOUNTER — Other Ambulatory Visit (HOSPITAL_COMMUNITY)
Admission: RE | Admit: 2018-01-05 | Discharge: 2018-01-05 | Disposition: A | Discharge status: Home / Self Care | Payer: BLUE CROSS/BLUE SHIELD | Source: Ambulatory Visit | Attending: Family Medicine | Admitting: Family Medicine

## 2018-01-05 DIAGNOSIS — Z124 Encounter for screening for malignant neoplasm of cervix: Secondary | ICD-10-CM | POA: Diagnosis not present

## 2018-01-05 DIAGNOSIS — Z01411 Encounter for gynecological examination (general) (routine) with abnormal findings: Secondary | ICD-10-CM | POA: Diagnosis not present

## 2018-01-05 DIAGNOSIS — Z5181 Encounter for therapeutic drug level monitoring: Secondary | ICD-10-CM | POA: Diagnosis not present

## 2018-01-05 DIAGNOSIS — E039 Hypothyroidism, unspecified: Secondary | ICD-10-CM | POA: Diagnosis not present

## 2018-01-05 DIAGNOSIS — E785 Hyperlipidemia, unspecified: Secondary | ICD-10-CM | POA: Diagnosis not present

## 2018-01-05 DIAGNOSIS — Z0000 Encounter for general adult medical examination without abnormal findings: Secondary | ICD-10-CM | POA: Diagnosis not present

## 2018-01-06 LAB — CYTOLOGY - PAP
DIAGNOSIS: NEGATIVE
HPV (WINDOPATH): NOT DETECTED

## 2018-01-07 ENCOUNTER — Other Ambulatory Visit: Payer: Self-pay | Admitting: Oncology

## 2018-01-07 ENCOUNTER — Other Ambulatory Visit: Payer: Self-pay | Admitting: Family Medicine

## 2018-01-07 ENCOUNTER — Other Ambulatory Visit: Payer: Self-pay | Admitting: Obstetrics & Gynecology

## 2018-01-07 DIAGNOSIS — N63 Unspecified lump in unspecified breast: Secondary | ICD-10-CM

## 2018-01-23 ENCOUNTER — Ambulatory Visit
Admission: RE | Admit: 2018-01-23 | Discharge: 2018-01-23 | Disposition: A | Discharge status: Home / Self Care | Payer: BLUE CROSS/BLUE SHIELD | Source: Ambulatory Visit | Attending: Family Medicine | Admitting: Family Medicine

## 2018-01-23 DIAGNOSIS — R922 Inconclusive mammogram: Secondary | ICD-10-CM | POA: Diagnosis not present

## 2018-01-23 DIAGNOSIS — N63 Unspecified lump in unspecified breast: Secondary | ICD-10-CM

## 2018-01-23 DIAGNOSIS — N6310 Unspecified lump in the right breast, unspecified quadrant: Secondary | ICD-10-CM | POA: Diagnosis not present

## 2018-03-06 DIAGNOSIS — G43519 Persistent migraine aura without cerebral infarction, intractable, without status migrainosus: Secondary | ICD-10-CM | POA: Diagnosis not present

## 2018-03-19 DIAGNOSIS — F408 Other phobic anxiety disorders: Secondary | ICD-10-CM | POA: Diagnosis not present

## 2018-03-19 DIAGNOSIS — F3170 Bipolar disorder, currently in remission, most recent episode unspecified: Secondary | ICD-10-CM | POA: Diagnosis not present

## 2018-03-19 DIAGNOSIS — F4310 Post-traumatic stress disorder, unspecified: Secondary | ICD-10-CM | POA: Diagnosis not present

## 2018-03-21 DIAGNOSIS — B349 Viral infection, unspecified: Secondary | ICD-10-CM | POA: Diagnosis not present

## 2018-06-16 DIAGNOSIS — M5417 Radiculopathy, lumbosacral region: Secondary | ICD-10-CM | POA: Diagnosis not present

## 2018-06-16 DIAGNOSIS — M5136 Other intervertebral disc degeneration, lumbar region: Secondary | ICD-10-CM | POA: Diagnosis not present

## 2018-06-16 DIAGNOSIS — Z9889 Other specified postprocedural states: Secondary | ICD-10-CM | POA: Diagnosis not present

## 2018-06-18 DIAGNOSIS — F408 Other phobic anxiety disorders: Secondary | ICD-10-CM | POA: Diagnosis not present

## 2018-06-18 DIAGNOSIS — F3170 Bipolar disorder, currently in remission, most recent episode unspecified: Secondary | ICD-10-CM | POA: Diagnosis not present

## 2018-06-18 DIAGNOSIS — F4310 Post-traumatic stress disorder, unspecified: Secondary | ICD-10-CM | POA: Diagnosis not present

## 2018-07-10 DIAGNOSIS — J0100 Acute maxillary sinusitis, unspecified: Secondary | ICD-10-CM | POA: Diagnosis not present

## 2018-07-16 DIAGNOSIS — Z79899 Other long term (current) drug therapy: Secondary | ICD-10-CM | POA: Diagnosis not present

## 2018-07-16 DIAGNOSIS — F3170 Bipolar disorder, currently in remission, most recent episode unspecified: Secondary | ICD-10-CM | POA: Diagnosis not present

## 2018-07-26 DIAGNOSIS — R202 Paresthesia of skin: Secondary | ICD-10-CM | POA: Diagnosis not present

## 2018-08-17 DIAGNOSIS — J0100 Acute maxillary sinusitis, unspecified: Secondary | ICD-10-CM | POA: Diagnosis not present

## 2018-08-17 DIAGNOSIS — M62838 Other muscle spasm: Secondary | ICD-10-CM | POA: Diagnosis not present

## 2018-09-17 DIAGNOSIS — F4310 Post-traumatic stress disorder, unspecified: Secondary | ICD-10-CM | POA: Diagnosis not present

## 2018-09-17 DIAGNOSIS — F3170 Bipolar disorder, currently in remission, most recent episode unspecified: Secondary | ICD-10-CM | POA: Diagnosis not present

## 2018-09-17 DIAGNOSIS — F408 Other phobic anxiety disorders: Secondary | ICD-10-CM | POA: Diagnosis not present

## 2018-10-08 DIAGNOSIS — J019 Acute sinusitis, unspecified: Secondary | ICD-10-CM | POA: Diagnosis not present

## 2018-11-09 DIAGNOSIS — B349 Viral infection, unspecified: Secondary | ICD-10-CM | POA: Diagnosis not present

## 2018-11-10 DIAGNOSIS — R509 Fever, unspecified: Secondary | ICD-10-CM | POA: Diagnosis not present

## 2018-11-10 DIAGNOSIS — R05 Cough: Secondary | ICD-10-CM | POA: Diagnosis not present

## 2018-12-17 DIAGNOSIS — F4310 Post-traumatic stress disorder, unspecified: Secondary | ICD-10-CM | POA: Diagnosis not present

## 2018-12-17 DIAGNOSIS — F408 Other phobic anxiety disorders: Secondary | ICD-10-CM | POA: Diagnosis not present

## 2018-12-17 DIAGNOSIS — F3170 Bipolar disorder, currently in remission, most recent episode unspecified: Secondary | ICD-10-CM | POA: Diagnosis not present

## 2019-02-01 DIAGNOSIS — Z0000 Encounter for general adult medical examination without abnormal findings: Secondary | ICD-10-CM | POA: Diagnosis not present

## 2019-02-12 DIAGNOSIS — E039 Hypothyroidism, unspecified: Secondary | ICD-10-CM | POA: Diagnosis not present

## 2019-02-12 DIAGNOSIS — Z5181 Encounter for therapeutic drug level monitoring: Secondary | ICD-10-CM | POA: Diagnosis not present

## 2019-02-12 DIAGNOSIS — E785 Hyperlipidemia, unspecified: Secondary | ICD-10-CM | POA: Diagnosis not present

## 2019-02-12 DIAGNOSIS — R739 Hyperglycemia, unspecified: Secondary | ICD-10-CM | POA: Diagnosis not present

## 2019-03-16 DIAGNOSIS — F3170 Bipolar disorder, currently in remission, most recent episode unspecified: Secondary | ICD-10-CM | POA: Diagnosis not present

## 2019-03-16 DIAGNOSIS — F408 Other phobic anxiety disorders: Secondary | ICD-10-CM | POA: Diagnosis not present

## 2019-03-16 DIAGNOSIS — F4310 Post-traumatic stress disorder, unspecified: Secondary | ICD-10-CM | POA: Diagnosis not present

## 2019-03-24 DIAGNOSIS — R05 Cough: Secondary | ICD-10-CM | POA: Diagnosis not present

## 2019-04-13 DIAGNOSIS — M5136 Other intervertebral disc degeneration, lumbar region: Secondary | ICD-10-CM | POA: Diagnosis not present

## 2019-04-13 DIAGNOSIS — M545 Low back pain: Secondary | ICD-10-CM | POA: Diagnosis not present

## 2019-04-13 DIAGNOSIS — M5417 Radiculopathy, lumbosacral region: Secondary | ICD-10-CM | POA: Diagnosis not present

## 2019-04-16 DIAGNOSIS — M5136 Other intervertebral disc degeneration, lumbar region: Secondary | ICD-10-CM | POA: Diagnosis not present

## 2019-04-16 DIAGNOSIS — M5126 Other intervertebral disc displacement, lumbar region: Secondary | ICD-10-CM | POA: Diagnosis not present

## 2019-04-16 DIAGNOSIS — Z9089 Acquired absence of other organs: Secondary | ICD-10-CM | POA: Diagnosis not present

## 2019-04-19 NOTE — Progress Notes (Signed)
Virtual Visit via Video Note The purpose of this virtual visit is to provide medical care while limiting exposure to the novel coronavirus.    Consent was obtained for video visit:  yes Answered questions that patient had about telehealth interaction:  yes I discussed the limitations, risks, security and privacy concerns of performing an evaluation and management service by telemedicine. I also discussed with the patient that there may be a patient responsible charge related to this service. The patient expressed understanding and agreed to proceed.  Pt location: Home Physician Location: office Name of referring provider:  Shirlean MylarWebb, Carol, MD I connected with Sheila Mathews Crispen at patients initiation/request on 04/21/2019 at  9:50 AM EDT by video enabled telemedicine application and verified that I am speaking with the correct person using two identifiers. Pt MRN:  [id number] Pt DOB:  11/13/1975 Video Participants:  Sheila Mathews Meske   History of Present Illness:  Sheila Mathews is a 43 year old right-handed woman history of migraines, panic attacks, anxiety and allergic rhinitis who presents for migraine.  Last seen in July 2015.  Onset:  43 years old Location:  Usually right-sided, retro-orbital, and moves to the top of the head Quality:  Usually pressure-like, sometimes stabbing Intensity:  Usually 6-7/10, 10 out of 10 for severe Aura:  No Prodrome:  No Associated symptoms:  First severe, experiences nausea, blurred vision, photophobia, phonophobia, and osmophobia. Duration:  Severe attacks last up to 4 days. Otherwise 2-3 hours Frequency:  Severe attacks occur once a month (4 days per month), otherwise other headaches occur 2 days per week. Total of 15-18 headache days per month. Triggers/exacerbating factors:  Menstrual cycle Relieving factors:  Phenergan/Toradol shots Activity:  Cannot function with severe attacks  A year ago, they started occurring twice a month.  She is not  perimenopausal based on blood work ordered by her gynecologist.    Current NSAIDS:  none Current analgesics:  none Current triptans:  Sumatriptan 50mg  (helps for a little while but then returns) Current ergotamine:  none Current anti-emetic:  none Current muscle relaxants:  Robaxin Current anti-anxiolytic:  Klonopin Current sleep aide:  none Current Antihypertensive medications:  none Current Antidepressant/antipsychotic/mood medications:  Sertraline 150mg , Lithium Current Anticonvulsant medications:  Gabapentin 900mg  twice daily, lamotrigine 100mg  daily Current anti-CGRP:  none Current Vitamins/Herbal/Supplements:  none Current Antihistamines/Decongestants:  Flonase Other therapy:  none Hormone/birth control:  none  Past NSAIDS:  Ketorolac, naproxen Past analgesics:  Excedrin, Extra-strength Tylenol Past abortive triptans:  Relpax 40mg , Maxalt 10mg , sumatriptan injection Past abortive ergotamine:  none Past muscle relaxants:  Flexeril Past anti-emetic:  Zofran 4mg  Past antihypertensive medications:  No beta blockers as runs low HR Past antidepressant medications:  amitriptline 10mg  (side effects), citalopram Past anticonvulsant medications:  Depakote, topiramate Past anti-CGRP:  none Other past therapies:  non  Caffeine:  1 cup of coffee per day Alcohol:  Occasionally Smoker:  No Diet:  8 healthy. Drinks a lot of water. Exercise:  Good Depression/stress:  Anxiety controlled Sleep hygiene:  Wakes up during the night and difficulty falling back asleep Family history of headache:  No  Past Medical History: Past Medical History:  Diagnosis Date  . Anxiety   . Complication of anesthesia    pt doesn't like mask she has panic attack; childhood  . GERD (gastroesophageal reflux disease)   . Headache(784.0)   . Pneumonia   . PONV (postoperative nausea and vomiting)    no issues with short procdures, had one isntance of vomitus following a  longer procedure   . Sciatica      Medications: Outpatient Encounter Medications as of 04/21/2019  Medication Sig  . acetaminophen (TYLENOL) 500 MG tablet Take 500 mg by mouth every 6 (six) hours as needed.  Marland Kitchen acetaminophen-codeine (TYLENOL #3) 300-30 MG tablet Take 1-2 tablets by mouth every 4 (four) hours as needed for moderate pain.  Marland Kitchen ALPRAZolam (XANAX) 1 MG tablet Take 1 mg by mouth at bedtime as needed for anxiety or sleep.  Marland Kitchen amphetamine-dextroamphetamine (ADDERALL XR) 20 MG 24 hr capsule Take 20 mg by mouth daily.  . Azelastine HCl 0.15 % SOLN Place 1 puff into both nostrils 2 (two) times daily as needed. Sinus issues  . docusate sodium (COLACE) 100 MG capsule Take 1 capsule (100 mg total) by mouth 2 (two) times daily as needed for mild constipation.  . DULoxetine (CYMBALTA) 60 MG capsule Take 60 mg by mouth daily.  Marland Kitchen eletriptan (RELPAX) 40 MG tablet Take 40 mg by mouth as needed for migraine or headache. One tablet by mouth at onset of headache. May repeat in 2 hours if headache persists or recurs.  Marland Kitchen esomeprazole (NEXIUM) 20 MG capsule Take 20 mg by mouth daily at 12 noon.  . gabapentin (NEURONTIN) 300 MG capsule Take 900 mg by mouth 2 (two) times daily.  Marland Kitchen levocetirizine (XYZAL) 5 MG tablet Take 5 mg by mouth daily.  . methocarbamol (ROBAXIN) 500 MG tablet Take 1 tablet (500 mg total) by mouth every 6 (six) hours as needed for muscle spasms.  . montelukast (SINGULAIR) 10 MG tablet Take 10 mg by mouth every evening.  . polyethylene glycol (MIRALAX / GLYCOLAX) packet Take 17 g by mouth daily.  . Probiotic Product (PROBIOTIC PO) Take 1 capsule by mouth daily.   No facility-administered encounter medications on file as of 04/21/2019.     Allergies: Allergies  Allergen Reactions  . Hydrocodone Other (See Comments)    Makes patient aggressive     Family History: Family History  Problem Relation Age of Onset  . Cancer Maternal Grandmother        breast   . Breast cancer Maternal Grandmother   . Cancer Maternal  Grandfather        head neck   . Diabetes Maternal Grandfather   . Cancer Paternal Grandmother        gallbladder  . Cancer Paternal Grandfather        colon   . Diabetes Paternal Grandfather     Social History: Social History   Socioeconomic History  . Marital status: Married    Spouse name: Not on file  . Number of children: Not on file  . Years of education: Not on file  . Highest education level: Not on file  Occupational History  . Not on file  Social Needs  . Financial resource strain: Not on file  . Food insecurity    Worry: Not on file    Inability: Not on file  . Transportation needs    Medical: Not on file    Non-medical: Not on file  Tobacco Use  . Smoking status: Never Smoker  . Smokeless tobacco: Never Used  Substance and Sexual Activity  . Alcohol use: Yes    Comment: social  . Drug use: No  . Sexual activity: Yes    Partners: Male  Lifestyle  . Physical activity    Days per week: Not on file    Minutes per session: Not on file  . Stress: Not on  file  Relationships  . Social Musician on phone: Not on file    Gets together: Not on file    Attends religious service: Not on file    Active member of club or organization: Not on file    Attends meetings of clubs or organizations: Not on file    Relationship status: Not on file  . Intimate partner violence    Fear of current or ex partner: Not on file    Emotionally abused: Not on file    Physically abused: Not on file    Forced sexual activity: Not on file  Other Topics Concern  . Not on file  Social History Narrative  . Not on file    Observations/Objective:   Height 5' 3.5" (1.613 m), weight 183 lb (83 kg).a No acute distress.  Alert and oriented.  Speech fluent and not dysarthric.  Language intact.  Eyes orthophoric on primary gaze.  Face symmetric.  Assessment and Plan:   Menstrual migraine, intractable.  Now occurring twice a month.  Suspect still hormonal.  1.  For  preventative management, start Aimovig 70mg  monthly 2.  For abortive therapy, Ubrelvy 100mg  3.  Limit use of pain relievers to no more than 2 days out of week to prevent risk of rebound or medication-overuse headache. 4.  Keep headache diary 5.  Exercise, hydration, caffeine cessation, sleep hygiene, monitor for and avoid triggers 6.  Consider:  magnesium citrate 400mg  daily, riboflavin 400mg  daily, and coenzyme Q10 100mg  three times daily 7. Follow up 4 months   Follow Up Instructions:    -I discussed the assessment and treatment plan with the patient. The patient was provided an opportunity to ask questions and all were answered. The patient agreed with the plan and demonstrated an understanding of the instructions.   The patient was advised to call back or seek an in-person evaluation if the symptoms worsen or if the condition fails to improve as anticipated.    Total Time spent in visit with the patient was:  40 minutes  , DO

## 2019-04-21 ENCOUNTER — Encounter: Payer: Self-pay | Admitting: *Deleted

## 2019-04-21 ENCOUNTER — Other Ambulatory Visit: Payer: Self-pay

## 2019-04-21 ENCOUNTER — Encounter: Payer: Self-pay | Admitting: Neurology

## 2019-04-21 ENCOUNTER — Telehealth (INDEPENDENT_AMBULATORY_CARE_PROVIDER_SITE_OTHER): Payer: BC Managed Care – PPO | Admitting: Neurology

## 2019-04-21 VITALS — Ht 63.5 in | Wt 183.0 lb

## 2019-04-21 DIAGNOSIS — G43839 Menstrual migraine, intractable, without status migrainosus: Secondary | ICD-10-CM | POA: Diagnosis not present

## 2019-04-21 MED ORDER — AIMOVIG 70 MG/ML ~~LOC~~ SOAJ: 70.0000 mg | SUBCUTANEOUS | 11 refills | Status: DC

## 2019-04-21 MED ORDER — UBRELVY 100 MG PO TABS: 1.0000 | ORAL_TABLET | ORAL | 3 refills | Status: DC | PRN

## 2019-04-21 NOTE — Progress Notes (Signed)
ELISKA HAMIL (Key: [id number]) Rx #: [id number] Roselyn Meier 100MG  tablets   Form [employer] [place] [employer] Electronic Request Form (CB) Created 3 hours ago Sent to Plan 5 minutes ago Plan Response 5 minutes ago Submit Clinical Questions less than a minute ago Determination Favorable less than a minute ago Message from Plan Effective from 04/21/2019 through 07/13/2019.

## 2019-04-21 NOTE — Progress Notes (Signed)
Sheila Mathews ([employer] Parents) Rx #: [id number] Aimovig 70MG /ML auto-injectors   Form [employer] control surveyor Form (CB) Created 3 hours ago Sent to Plan 5 minutes ago Plan Response 5 minutes ago Submit Clinical Questions less than a minute ago Determination Favorable less than a minute ago Message from Plan Effective from 04/21/2019 through 07/19/2019.

## 2019-04-21 NOTE — Patient Instructions (Signed)
1.  Start Aimovig 70mg  injection every 30 days (may inject into abdomen or thigh).  Please go to [email or web address] to apply for copay card. 2.  At earliest onset of migraine, take Ubrelvy 100mg .  May repeat in 2 hours if needed (maximum 2 tablets in 24 hours) 3.  Limit use of pain relievers to no more than 2 days out of week to prevent risk of rebound or medication-overuse headache. 4.  Keep headache diary 5.  Follow up in 4 months.

## 2019-05-07 DIAGNOSIS — M5416 Radiculopathy, lumbar region: Secondary | ICD-10-CM | POA: Diagnosis not present

## 2019-05-07 DIAGNOSIS — M5117 Intervertebral disc disorders with radiculopathy, lumbosacral region: Secondary | ICD-10-CM | POA: Diagnosis not present

## 2019-05-21 DIAGNOSIS — M545 Low back pain: Secondary | ICD-10-CM | POA: Diagnosis not present

## 2019-06-03 DIAGNOSIS — M5136 Other intervertebral disc degeneration, lumbar region: Secondary | ICD-10-CM | POA: Diagnosis not present

## 2019-06-03 DIAGNOSIS — Z9089 Acquired absence of other organs: Secondary | ICD-10-CM | POA: Diagnosis not present

## 2019-06-03 DIAGNOSIS — M5416 Radiculopathy, lumbar region: Secondary | ICD-10-CM | POA: Diagnosis not present

## 2019-06-03 DIAGNOSIS — M5126 Other intervertebral disc displacement, lumbar region: Secondary | ICD-10-CM | POA: Diagnosis not present

## 2019-06-11 DIAGNOSIS — F408 Other phobic anxiety disorders: Secondary | ICD-10-CM | POA: Diagnosis not present

## 2019-06-11 DIAGNOSIS — F3170 Bipolar disorder, currently in remission, most recent episode unspecified: Secondary | ICD-10-CM | POA: Diagnosis not present

## 2019-06-11 DIAGNOSIS — F4310 Post-traumatic stress disorder, unspecified: Secondary | ICD-10-CM | POA: Diagnosis not present

## 2019-06-15 DIAGNOSIS — Z03818 Encounter for observation for suspected exposure to other biological agents ruled out: Secondary | ICD-10-CM | POA: Diagnosis not present

## 2019-06-15 DIAGNOSIS — Z20828 Contact with and (suspected) exposure to other viral communicable diseases: Secondary | ICD-10-CM | POA: Diagnosis not present

## 2019-07-01 DIAGNOSIS — M5136 Other intervertebral disc degeneration, lumbar region: Secondary | ICD-10-CM | POA: Diagnosis not present

## 2019-07-16 DIAGNOSIS — Z20828 Contact with and (suspected) exposure to other viral communicable diseases: Secondary | ICD-10-CM | POA: Diagnosis not present

## 2019-07-19 DIAGNOSIS — J0100 Acute maxillary sinusitis, unspecified: Secondary | ICD-10-CM | POA: Diagnosis not present

## 2019-07-28 DIAGNOSIS — Z20828 Contact with and (suspected) exposure to other viral communicable diseases: Secondary | ICD-10-CM | POA: Diagnosis not present

## 2019-07-28 DIAGNOSIS — Z03818 Encounter for observation for suspected exposure to other biological agents ruled out: Secondary | ICD-10-CM | POA: Diagnosis not present

## 2019-08-11 NOTE — Progress Notes (Signed)
Aimovig 70mg /ml PA started  shield waiting for determination   Key [id number]

## 2019-08-11 NOTE — Progress Notes (Signed)
PA for Aimovig 70mg /ml was denied appeal was started

## 2019-08-31 DIAGNOSIS — R5382 Chronic fatigue, unspecified: Secondary | ICD-10-CM | POA: Diagnosis not present

## 2019-08-31 DIAGNOSIS — E785 Hyperlipidemia, unspecified: Secondary | ICD-10-CM | POA: Diagnosis not present

## 2019-08-31 DIAGNOSIS — E039 Hypothyroidism, unspecified: Secondary | ICD-10-CM | POA: Diagnosis not present

## 2019-09-06 DIAGNOSIS — F408 Other phobic anxiety disorders: Secondary | ICD-10-CM | POA: Diagnosis not present

## 2019-09-06 DIAGNOSIS — F3170 Bipolar disorder, currently in remission, most recent episode unspecified: Secondary | ICD-10-CM | POA: Diagnosis not present

## 2019-09-06 DIAGNOSIS — F4310 Post-traumatic stress disorder, unspecified: Secondary | ICD-10-CM | POA: Diagnosis not present

## 2019-09-24 DIAGNOSIS — E039 Hypothyroidism, unspecified: Secondary | ICD-10-CM | POA: Diagnosis not present

## 2019-09-24 DIAGNOSIS — E785 Hyperlipidemia, unspecified: Secondary | ICD-10-CM | POA: Diagnosis not present

## 2019-09-24 DIAGNOSIS — R5382 Chronic fatigue, unspecified: Secondary | ICD-10-CM | POA: Diagnosis not present

## 2019-09-28 NOTE — Progress Notes (Signed)
New PA was started for the patient through [facility]- they said they didn't receive the Appeal for denial on 09/01/19; new KEY in CMM is [id number]. Awaiting determination.

## 2019-09-29 NOTE — Progress Notes (Signed)
Appeal was successful authorization was approved 08/11/2019-08/09/2020 Fax was sent to scan into her chart

## 2019-10-22 DIAGNOSIS — J0100 Acute maxillary sinusitis, unspecified: Secondary | ICD-10-CM | POA: Diagnosis not present

## 2020-01-04 DIAGNOSIS — M545 Low back pain: Secondary | ICD-10-CM | POA: Diagnosis not present

## 2020-01-14 DIAGNOSIS — M545 Low back pain, unspecified: Secondary | ICD-10-CM | POA: Insufficient documentation

## 2020-01-20 DIAGNOSIS — M545 Low back pain: Secondary | ICD-10-CM | POA: Diagnosis not present

## 2020-01-31 DIAGNOSIS — E785 Hyperlipidemia, unspecified: Secondary | ICD-10-CM | POA: Diagnosis not present

## 2020-01-31 DIAGNOSIS — Z5181 Encounter for therapeutic drug level monitoring: Secondary | ICD-10-CM | POA: Diagnosis not present

## 2020-01-31 DIAGNOSIS — E039 Hypothyroidism, unspecified: Secondary | ICD-10-CM | POA: Diagnosis not present

## 2020-01-31 DIAGNOSIS — Z0000 Encounter for general adult medical examination without abnormal findings: Secondary | ICD-10-CM | POA: Diagnosis not present

## 2020-01-31 DIAGNOSIS — E611 Iron deficiency: Secondary | ICD-10-CM | POA: Diagnosis not present

## 2020-02-13 DIAGNOSIS — H6692 Otitis media, unspecified, left ear: Secondary | ICD-10-CM | POA: Diagnosis not present

## 2020-02-13 DIAGNOSIS — Z03818 Encounter for observation for suspected exposure to other biological agents ruled out: Secondary | ICD-10-CM | POA: Diagnosis not present

## 2020-02-13 DIAGNOSIS — R0981 Nasal congestion: Secondary | ICD-10-CM | POA: Diagnosis not present

## 2020-02-13 DIAGNOSIS — J329 Chronic sinusitis, unspecified: Secondary | ICD-10-CM | POA: Diagnosis not present

## 2020-02-28 DIAGNOSIS — F4310 Post-traumatic stress disorder, unspecified: Secondary | ICD-10-CM | POA: Diagnosis not present

## 2020-02-28 DIAGNOSIS — F429 Obsessive-compulsive disorder, unspecified: Secondary | ICD-10-CM | POA: Diagnosis not present

## 2020-02-28 DIAGNOSIS — F3189 Other bipolar disorder: Secondary | ICD-10-CM | POA: Diagnosis not present

## 2020-03-07 DIAGNOSIS — H9202 Otalgia, left ear: Secondary | ICD-10-CM | POA: Diagnosis not present

## 2020-03-08 DIAGNOSIS — H9202 Otalgia, left ear: Secondary | ICD-10-CM | POA: Diagnosis not present

## 2020-03-08 DIAGNOSIS — Z20822 Contact with and (suspected) exposure to covid-19: Secondary | ICD-10-CM | POA: Diagnosis not present

## 2020-03-17 DIAGNOSIS — M5417 Radiculopathy, lumbosacral region: Secondary | ICD-10-CM | POA: Diagnosis not present

## 2020-03-17 DIAGNOSIS — M545 Low back pain: Secondary | ICD-10-CM | POA: Diagnosis not present

## 2020-04-03 DIAGNOSIS — M5136 Other intervertebral disc degeneration, lumbar region: Secondary | ICD-10-CM | POA: Diagnosis not present

## 2020-04-07 DIAGNOSIS — M545 Low back pain, unspecified: Secondary | ICD-10-CM | POA: Diagnosis not present

## 2020-04-20 ENCOUNTER — Other Ambulatory Visit: Payer: Self-pay

## 2020-04-20 DIAGNOSIS — R87619 Unspecified abnormal cytological findings in specimens from cervix uteri: Secondary | ICD-10-CM | POA: Insufficient documentation

## 2020-04-20 DIAGNOSIS — N939 Abnormal uterine and vaginal bleeding, unspecified: Secondary | ICD-10-CM | POA: Insufficient documentation

## 2020-04-20 HISTORY — DX: Unspecified abnormal cytological findings in specimens from cervix uteri: R87.619

## 2020-04-25 ENCOUNTER — Other Ambulatory Visit: Payer: Self-pay | Admitting: Neurology

## 2020-05-02 DIAGNOSIS — F3189 Other bipolar disorder: Secondary | ICD-10-CM | POA: Diagnosis not present

## 2020-05-02 DIAGNOSIS — F429 Obsessive-compulsive disorder, unspecified: Secondary | ICD-10-CM | POA: Diagnosis not present

## 2020-05-02 DIAGNOSIS — F4310 Post-traumatic stress disorder, unspecified: Secondary | ICD-10-CM | POA: Diagnosis not present

## 2020-05-04 DIAGNOSIS — M5136 Other intervertebral disc degeneration, lumbar region: Secondary | ICD-10-CM | POA: Diagnosis not present

## 2020-05-04 DIAGNOSIS — Z9889 Other specified postprocedural states: Secondary | ICD-10-CM | POA: Diagnosis not present

## 2020-05-05 ENCOUNTER — Other Ambulatory Visit: Payer: Self-pay

## 2020-05-05 ENCOUNTER — Encounter: Payer: Self-pay | Admitting: Family Medicine

## 2020-05-05 ENCOUNTER — Ambulatory Visit (INDEPENDENT_AMBULATORY_CARE_PROVIDER_SITE_OTHER): Payer: BC Managed Care – PPO | Admitting: Family Medicine

## 2020-05-05 VITALS — BP 106/71 | HR 72 | Temp 99.0°F | Ht 64.0 in | Wt 203.0 lb

## 2020-05-05 DIAGNOSIS — F319 Bipolar disorder, unspecified: Secondary | ICD-10-CM | POA: Insufficient documentation

## 2020-05-05 DIAGNOSIS — G43909 Migraine, unspecified, not intractable, without status migrainosus: Secondary | ICD-10-CM

## 2020-05-05 DIAGNOSIS — Z7689 Persons encountering health services in other specified circumstances: Secondary | ICD-10-CM | POA: Diagnosis not present

## 2020-05-05 DIAGNOSIS — E039 Hypothyroidism, unspecified: Secondary | ICD-10-CM | POA: Insufficient documentation

## 2020-05-05 DIAGNOSIS — Z23 Encounter for immunization: Secondary | ICD-10-CM

## 2020-05-05 DIAGNOSIS — Z79899 Other long term (current) drug therapy: Secondary | ICD-10-CM | POA: Diagnosis not present

## 2020-05-05 DIAGNOSIS — F4310 Post-traumatic stress disorder, unspecified: Secondary | ICD-10-CM

## 2020-05-05 DIAGNOSIS — F429 Obsessive-compulsive disorder, unspecified: Secondary | ICD-10-CM

## 2020-05-05 DIAGNOSIS — L853 Xerosis cutis: Secondary | ICD-10-CM | POA: Insufficient documentation

## 2020-05-05 DIAGNOSIS — K219 Gastro-esophageal reflux disease without esophagitis: Secondary | ICD-10-CM

## 2020-05-05 LAB — COMPREHENSIVE METABOLIC PANEL
ALT: 10 U/L (ref 0–35)
AST: 11 U/L (ref 0–37)
Albumin: 4.6 g/dL (ref 3.5–5.2)
Alkaline Phosphatase: 51 U/L (ref 39–117)
BUN: 12 mg/dL (ref 6–23)
CO2: 25 mEq/L (ref 19–32)
Calcium: 9.7 mg/dL (ref 8.4–10.5)
Chloride: 106 mEq/L (ref 96–112)
Creatinine, Ser: 0.79 mg/dL (ref 0.40–1.20)
GFR: 92.54 mL/min (ref >60.00)
Glucose, Bld: 88 mg/dL (ref 70–99)
Potassium: 3.8 mEq/L (ref 3.5–5.1)
Sodium: 140 mEq/L (ref 135–145)
Total Bilirubin: 0.7 mg/dL (ref 0.2–1.2)
Total Protein: 7.2 g/dL (ref 6.0–8.3)

## 2020-05-05 LAB — TSH: TSH: 3.3 u[IU]/mL (ref 0.35–4.50)

## 2020-05-05 LAB — T4, FREE: Free T4: 0.55 ng/dL — ABNORMAL LOW (ref 0.60–1.60)

## 2020-05-05 LAB — T3, FREE: T3, Free: 2.7 pg/mL (ref 2.3–4.2)

## 2020-05-05 MED ORDER — HYDROCORTISONE 1 % EX OINT: 1.0000 "application " | TOPICAL_OINTMENT | Freq: Two times a day (BID) | CUTANEOUS | 2 refills | Status: DC

## 2020-05-05 MED ORDER — LEVOTHYROXINE SODIUM 25 MCG PO TABS
25.0000 ug | ORAL_TABLET | Freq: Every day | ORAL | 3 refills | Status: DC
Start: 2020-05-05 — End: 2021-02-06

## 2020-05-05 MED ORDER — PANTOPRAZOLE SODIUM 40 MG PO TBEC: 40.0000 mg | DELAYED_RELEASE_TABLET | Freq: Every day | ORAL | 3 refills | Status: DC

## 2020-05-05 NOTE — Patient Instructions (Addendum)
Great to meet you today.  We will call you with lab results and adjust dose of thyroid med if necessary.    Cetaphil cream recommended.   Bag balm for hands- would start 3x a week until improved and then can decrease to a few times a month.   Steroid ointment for eyelids with flares. Avoid chrinic use more than 2 weeks at a time.   Start food journal and make appt to discuss weight if desiring weight loss guidance.

## 2020-05-05 NOTE — Progress Notes (Signed)
Patient ID: Sheila Mathews, female  DOB: 20-Feb-1976, 44 y.o.   MRN: [id number] Patient Care Team    Relationship Specialty Notifications Start End  Natalia Leatherwood, DO PCP - General Family Medicine  05/05/20     Chief Complaint  Patient presents with   Establish Care    pt c/o dry skin worse in winter     Subjective: Sheila Mathews is a 44 y.o.  female present for new patient establishment. All past medical history, surgical history, allergies, family history, immunizations, medications and social history were updated in the electronic medical record today. All recent labs, ED visits and hospitalizations within the last year were reviewed.  Long-term current use of lithium/Bipolar 1 disorder (HCC)/ PTSD (post-traumatic stress disorder)/Obsessive-compulsive disorder, unspecified type Patient is established with her psychiatry team who manages her medications.  She is prescribed lithium and is due for BMP.  Acquired hypothyroidism Patient reports she was started on thyroid replacement therapy approximately 3 months ago.  She has not been tested as of yet.  Migraine without status migrainosus, not intractable, unspecified migraine type Patient is managed by neurology team for her migraines.   Dry skin Patient reports she has significant dry skin across her eyebrows, between her eyes, on her cheeks and on her hands.  Gastroesophageal reflux disease without esophagitis Patient had been placed on Nexium.  She reports this used to work okay but now it is less effective.  She has not been tried on any other PPIs in the past.    Depression screen PHQ 2/9 05/05/2020  Decreased Interest 1  Down, Depressed, Hopeless 1  PHQ - 2 Score 2  Altered sleeping 3  Tired, decreased energy 1  Change in appetite 3  Feeling bad or failure about yourself  1  Trouble concentrating 0  Moving slowly or fidgety/restless 0  Suicidal thoughts 0  PHQ-9 Score 10   GAD 7 : Generalized Anxiety  Score 05/05/2020  Nervous, Anxious, on Edge 2  Control/stop worrying 0  Worry too much - different things 1  Trouble relaxing 1  Restless 1  Easily annoyed or irritable 0  Afraid - awful might happen 0  Total GAD 7 Score 5        Fall Risk  04/21/2019  Falls in the past year? 0  Number falls in past yr: 0  Injury with Fall? 0   Immunization History  Administered Date(s) Administered   Influenza,inj,Quad PF,6+ Mos 04/02/2019, 05/05/2020   Influenza,inj,quad, With Preservative 07/01/2016   Td 07/21/2014, 10/17/2016   Tdap 07/21/2014    No exam data present  Past Medical History:  Diagnosis Date   Abnormal cervical Papanicolaou smear 04/20/2020   Allergies    Anxiety    Bipolar disorder (HCC)    Chicken pox    Chronic sinusitis 07/26/2016   Complication of anesthesia    pt doesn't like mask she has panic attack; childhood   Depression    GERD (gastroesophageal reflux disease)    Migraines    Pelvic pain in female 05/13/2014   Pneumonia    PONV (postoperative nausea and vomiting)    no issues with short procdures, had one isntance of vomitus following a longer procedure    Sciatica    Allergies  Allergen Reactions   Almond (Diagnostic) Anaphylaxis   Codeine Other (See Comments)    Makes patient aggressive  Makes patient aggressive    Hydrocodone Other (See Comments)    Makes patient aggressive  Past Surgical History:  Procedure Laterality Date   CESAREAN SECTION  x2   2000, 2005   LAPAROSCOPY N/A 05/13/2014   Procedure: LAPAROSCOPY DIAGNOSTIC;  Surgeon: Lavina Hamman, MD;  Location: [facility];  Service: Gynecology;  Laterality: N/A;   LUMBAR LAMINECTOMY/DECOMPRESSION MICRODISCECTOMY Left 02/12/2017   Procedure: Microlumbar decompression L5-S1 left ;  Surgeon: Jene Every, MD;  Location: [facility];  Service: Orthopedics;  Laterality: Left;  90 mins   SALIVARY GLAND SURGERY  1986   UPPER GI ENDOSCOPY     WISDOM TOOTH EXTRACTION      Family History  Problem Relation Age of Onset   Cancer Maternal Grandmother        breast    Breast cancer Maternal Grandmother    Cancer Maternal Grandfather        head neck    Diabetes Maternal Grandfather    Cancer Paternal Grandmother        gallbladder   Cancer Paternal Grandfather        colon    Diabetes Paternal Grandfather    Depression Mother    Mental illness Mother    Diabetes Brother    Drug abuse Brother    Early death Brother    Mental illness Brother    Social History   Social History Narrative   Marital status/children/pets: Married, 3 children.    Education/employment: Bachelor's degree, healthcare advocate   Safety:      -smoke alarm in the home:Yes     - wears seatbelt: Yes     - Feels safe in their relationships: Yes    Allergies as of 05/05/2020      Reactions   Almond (diagnostic) Anaphylaxis   Codeine Other (See Comments)   Makes patient aggressive  Makes patient aggressive    Hydrocodone Other (See Comments)   Makes patient aggressive       Medication List       Accurate as of May 05, 2020  5:03 PM. If you have any questions, ask your nurse or doctor.        STOP taking these medications   ALPRAZolam 1 MG tablet Commonly known as: XANAX Stopped by: Felix Pacini, DO   amphetamine-dextroamphetamine 20 MG 24 hr capsule Commonly known as: ADDERALL XR Stopped by: Felix Pacini, DO   Dexilant 60 MG capsule Generic drug: dexlansoprazole Stopped by: Felix Pacini, DO   esomeprazole 20 MG capsule Commonly known as: NEXIUM Stopped by: Felix Pacini, DO   promethazine-codeine 6.25-10 MG/5ML syrup Commonly known as: PHENERGAN with CODEINE Stopped by: Felix Pacini, DO   tiZANidine 4 MG tablet Commonly known as: ZANAFLEX Stopped by: Felix Pacini, DO   Ubrelvy 100 MG Tabs Generic drug: Ubrogepant Stopped by: Felix Pacini, DO     TAKE these medications   Acetaminophen-Codeine 300-30 MG tablet Take by mouth  daily as needed.   Aimovig 70 MG/ML Soaj Generic drug: Erenumab-aooe Inject 70 mg into the skin every 30 (thirty) days.   azelastine 0.1 % nasal spray Commonly known as: ASTELIN Place 2 sprays into both nostrils 2 (two) times daily. What changed: Another medication with the same name was removed. Continue taking this medication, and follow the directions you see here. Changed by: Felix Pacini, DO   clonazePAM 0.5 MG tablet Commonly known as: KLONOPIN TAKE 1 TABLET BY MOUTH 3 TIMES A DAY AS NEEDED FOR ANXIETY   gabapentin 300 MG capsule Commonly known as: NEURONTIN Take 900 mg by mouth 2 (two) times daily.   hydrocortisone  1 % ointment Apply 1 application topically 2 (two) times daily. Started by: Felix Pacini, DO   ketorolac 10 MG tablet Commonly known as: TORADOL ketorolac 10 mg tablet  TAKE 1 TABLET BY MOUTH EVERY 6 HOURS FOR 5 DAYS   lamoTRIgine 150 MG tablet Commonly known as: LAMICTAL Take 150 mg by mouth daily. What changed: Another medication with the same name was removed. Continue taking this medication, and follow the directions you see here. Changed by: Felix Pacini, DO   levothyroxine 25 MCG tablet Commonly known as: SYNTHROID Take 1 tablet (25 mcg total) by mouth daily.   lithium carbonate 300 MG CR tablet Commonly known as: LITHOBID Take by mouth.   methocarbamol 500 MG tablet Commonly known as: ROBAXIN methocarbamol 500 mg tablet  TAKE 1 TABLET EVERY 6 8 HOURS BY ORAL ROUTE AS NEEDED FOR 10 DAYS.   pantoprazole 40 MG tablet Commonly known as: PROTONIX Take 1 tablet (40 mg total) by mouth daily. Started by: Felix Pacini, DO   sertraline 100 MG tablet Commonly known as: ZOLOFT Take 150 mg by mouth daily.   SUMAtriptan 50 MG tablet Commonly known as: IMITREX Take by mouth.   zolpidem 10 MG tablet Commonly known as: AMBIEN Take 10 mg by mouth at bedtime as needed.       All past medical history, surgical history, allergies, family  history, immunizations andmedications were updated in the EMR today and reviewed under the history and medication portions of their EMR.      ROS: 14 pt review of systems performed and negative (unless mentioned in an HPI)  Objective: BP 106/71    Pulse 72    Temp 99 F (37.2 C) (Oral)    Ht 5\' 4"  (1.626 m)    Wt 203 lb (92.1 kg)    SpO2 96%    BMI 34.84 kg/m  Gen: Afebrile. No acute distress. Nontoxic in appearance, well-developed, well-nourished, pleasant female HENT: AT. [place]. Eyes:Pupils Equal Round Reactive to light, Extraocular movements intact,  Conjunctiva without redness, discharge or icterus. Neck/lymp/endocrine: Supple, no lymphadenopathy, no thyromegaly CV: RRR no murmur, no edema  Chest: CTAB, no wheeze, rhonchi or crackles. Skin: Dry flaky skin bridge and nose, forehead and extensively dry skin on hands with cracking of her thumbs bilaterally. No rashes, purpura or petechiae. Warm and well-perfused. Skin intact. Neuro/Msk:  Normal gait. PERLA. EOMi. Alert. Oriented x3.  Psych: Normal affect, dress and demeanor. Normal speech. Normal thought content and judgment.   Assessment/plan: Sheila Mathews is a 44 y.o. female present for  Establishing care with new doctor, encounter for Need for influenza vaccination Flu shot provided today Long-term current use of lithium/Bipolar 1 disorder (HCC)/ PTSD (post-traumatic stress disorder)/Obsessive-compulsive disorder, unspecified type Managed by psychiatry - Comprehensive metabolic panel - TSH - Lithium level - T4, free - T3, free  Acquired hypothyroidism We will obtain TSH, T4 free and T3 free today. Refills will be provided on levothyroxine 25 mcg daily or appropriate dose if necessary by laboratory results  Migraine without status migrainosus, not intractable, unspecified migraine type Managed by neurology  Dry skin -Hydrocortisone cream prescribed for upper eyelids and face.  Patient understands to avoid overuse of  steroid cream on face.  No longer than 2 weeks at a time suggested. -Cetaphil cream application daily after showers encouraged. -Cetaphil cream and/or bag balm application to hands encouraged. Follow-up as needed  Gastroesophageal reflux disease without esophagitis Discussed reflux diet. Encourage small more frequent meals.  Avoid laying flat at  least 3 hours after meals. DC Nexium, start Protonix. Follow-up as needed   No follow-ups on file.  Orders Placed This Encounter  Procedures   Flu Vaccine QUAD 6+ mos PF IM (Fluarix Quad PF)   Comprehensive metabolic panel   TSH   Lithium level   T4, free   T3, free   Meds ordered this encounter  Medications   pantoprazole (PROTONIX) 40 MG tablet    Sig: Take 1 tablet (40 mg total) by mouth daily.    Dispense:  90 tablet    Refill:  3   hydrocortisone 1 % ointment    Sig: Apply 1 application topically 2 (two) times daily.    Dispense:  30 g    Refill:  2   levothyroxine (SYNTHROID) 25 MCG tablet    Sig: Take 1 tablet (25 mcg total) by mouth daily.    Dispense:  90 tablet    Refill:  3   Referral Orders  No referral(s) requested today    > 45 Minutes was dedicated to this patient's encounter to include pre-visit review of chart, face-to-face time with patient and post-visit work- which include documentation and prescribing medications and/or ordering test when necessary.    Note is dictated utilizing voice recognition software. Although note has been proof read prior to signing, occasional typographical errors still can be missed. If any questions arise, please do not hesitate to call for verification.  Electronically signed by: Felix Pacinienee Krysteena Stalker, DO [facility]

## 2020-05-06 LAB — LITHIUM LEVEL: Lithium Lvl: 0.7 mmol/L (ref 0.6–1.2)

## 2020-05-09 ENCOUNTER — Telehealth: Payer: Self-pay | Admitting: Neurology

## 2020-05-09 ENCOUNTER — Other Ambulatory Visit: Payer: Self-pay | Admitting: Neurology

## 2020-05-09 MED ORDER — AIMOVIG 70 MG/ML ~~LOC~~ SOAJ
70.0000 mg | SUBCUTANEOUS | 2 refills | Status: DC
Start: 2020-05-09 — End: 2020-07-28

## 2020-05-09 NOTE — Telephone Encounter (Signed)
Patient called in and needs a refill of her Aimovig. Please send it to the [facility] in [place]

## 2020-05-09 NOTE — Telephone Encounter (Signed)
We can refill it with 2 refills and place on cancellation list - easy cancellation.  If there is an opening, she needs to take it

## 2020-05-09 NOTE — Telephone Encounter (Signed)
Pt is on the wait list  ?

## 2020-05-19 DIAGNOSIS — M5126 Other intervertebral disc displacement, lumbar region: Secondary | ICD-10-CM | POA: Diagnosis not present

## 2020-05-19 DIAGNOSIS — M5136 Other intervertebral disc degeneration, lumbar region: Secondary | ICD-10-CM | POA: Diagnosis not present

## 2020-05-22 NOTE — Progress Notes (Deleted)
NEUROLOGY FOLLOW UP OFFICE NOTE  [name] [id number]   Subjective:  Sheila Mathews is a 44 year old right-handed woman history of migraines, panic attacks, anxiety and allergic rhinitis who follows up for migraine.  UPDATE: Intensity:  *** Duration:  *** Frequency:  *** Frequency of abortive medication: *** Current NSAIDS:  none Current analgesics:  none Current triptans:  Sumatriptan 50mg  (helps for a little while but then returns) Current ergotamine:  none Current anti-emetic:  none Current muscle relaxants:  Robaxin Current anti-anxiolytic:  Klonopin Current sleep aide:  none Current Antihypertensive medications:  none Current Antidepressant/antipsychotic/mood medications:  Sertraline 150mg , Lithium Current Anticonvulsant medications:  Gabapentin 900mg  twice daily, lamotrigine 100mg  daily Current anti-CGRP:  Aimovig 70mg , Ubrelvy 100mg  Current Vitamins/Herbal/Supplements:  none Current Antihistamines/Decongestants:  Flonase Other therapy:  none Hormone/birth control:  none  Caffeine: 1 cup of coffee per day Alcohol: Occasionally Smoker: No Diet: 8 healthy. Drinks a lot of water. Exercise: Good Depression/stress: Anxiety controlled Sleep hygiene: Wakes up during the night and difficulty falling back asleep  HISTORY: Onset: 44 years old Location: Usually right-sided, retro-orbital, and moves to the top of the head Quality: Usually pressure-like, sometimes stabbing Intensity: Usually 6-7/10, 10 out of 10 for severe Aura: No Prodrome: No Associated symptoms: First severe, experiences nausea, blurred vision, photophobia, phonophobia, and osmophobia. Duration: Severe attacks last up to 4 days. Otherwise 2-3 hours Frequency: Severe attacks occur once a month (4 days per month), otherwise other headaches occur 2 days per week. Total of 15-18 headache days per month. Triggers/exacerbating factors: Menstrual cycle Relieving factors:  Phenergan/Toradol shots Activity: Cannot function with severe attacks  A year ago, they started occurring twice a month.  She is not perimenopausal based on blood work ordered by her gynecologist.     Past NSAIDS:  Ketorolac, naproxen Past analgesics:  Excedrin, Extra-strength Tylenol Past abortive triptans:  Relpax 40mg , Maxalt 10mg , sumatriptan injection Past abortive ergotamine:  none Past muscle relaxants:  Flexeril Past anti-emetic:  Zofran 4mg  Past antihypertensive medications:  No beta blockers as runs low HR Past antidepressant medications:  amitriptline 10mg  (side effects), citalopram Past anticonvulsant medications:  Depakote, topiramate Past anti-CGRP:  none Other past therapies:  non   Family history of headache: No  PAST MEDICAL HISTORY: Past Medical History:  Diagnosis Date  . Abnormal cervical Papanicolaou smear 04/20/2020  . Allergies   . Anxiety   . Bipolar disorder (HCC)   . Chicken pox   . Chronic sinusitis 07/26/2016  . Complication of anesthesia    pt doesn't like mask she has panic attack; childhood  . Depression   . GERD (gastroesophageal reflux disease)   . Migraines   . Pelvic pain in female 05/13/2014  . Pneumonia   . PONV (postoperative nausea and vomiting)    no issues with short procdures, had one isntance of vomitus following a longer procedure   . Sciatica     MEDICATIONS: Current Outpatient Medications on File Prior to Visit  Medication Sig Dispense Refill  . Acetaminophen-Codeine 300-30 MG tablet Take by mouth daily as needed.     azelastine (ASTELIN) 0.1 % nasal spray Place 2 sprays into both nostrils 2 (two) times daily.     . clonazePAM (KLONOPIN) 0.5 MG tablet TAKE 1 TABLET BY MOUTH 3 TIMES A DAY AS NEEDED FOR ANXIETY    . Erenumab-aooe (AIMOVIG) 70 MG/ML SOAJ Inject 70 mg into the skin every 30 (thirty) days. 1.12 mL 2  . gabapentin (NEURONTIN) 300 MG capsule  Take 900 mg by mouth 2 (two) times daily.  0  . hydrocortisone 1  % ointment Apply 1 application topically 2 (two) times daily. 30 g 2  . ketorolac (TORADOL) 10 MG tablet ketorolac 10 mg tablet  TAKE 1 TABLET BY MOUTH EVERY 6 HOURS FOR 5 DAYS    . lamoTRIgine (LAMICTAL) 150 MG tablet Take 150 mg by mouth daily.    Marland Kitchen levothyroxine (SYNTHROID) 25 MCG tablet Take 1 tablet (25 mcg total) by mouth daily. 90 tablet 3  . lithium carbonate (LITHOBID) 300 MG CR tablet Take by mouth.    . methocarbamol (ROBAXIN) 500 MG tablet methocarbamol 500 mg tablet  TAKE 1 TABLET EVERY 6 8 HOURS BY ORAL ROUTE AS NEEDED FOR 10 DAYS.    Marland Kitchen pantoprazole (PROTONIX) 40 MG tablet Take 1 tablet (40 mg total) by mouth daily. 90 tablet 3  . sertraline (ZOLOFT) 100 MG tablet Take 150 mg by mouth daily.    . SUMAtriptan (IMITREX) 50 MG tablet Take by mouth.    . zolpidem (AMBIEN) 10 MG tablet Take 10 mg by mouth at bedtime as needed.     No current facility-administered medications on file prior to visit.    ALLERGIES: Allergies  Allergen Reactions  . Almond (Diagnostic) Anaphylaxis  . Codeine Other (See Comments)    Makes patient aggressive  Makes patient aggressive   . Hydrocodone Other (See Comments)    Makes patient aggressive     FAMILY HISTORY: Family History  Problem Relation Age of Onset  . Cancer Maternal Grandmother        breast   . Breast cancer Maternal Grandmother   . Cancer Maternal Grandfather        head neck   . Diabetes Maternal Grandfather   . Cancer Paternal Grandmother        gallbladder  . Cancer Paternal Grandfather        colon   . Diabetes Paternal Grandfather   . Depression Mother   . Mental illness Mother   . Diabetes Brother   . Drug abuse Brother   . Early death Brother   . Mental illness Brother     SOCIAL HISTORY: Social History   Socioeconomic History  . Marital status: Married    Spouse name: Not on file  . Number of children: 3  . Years of education: Not on file  . Highest education level: Bachelor's degree (e.g., BA, AB,  BS)  Occupational History  . Not on file  Tobacco Use  . Smoking status: Never Smoker  . Smokeless tobacco: Never Used  Vaping Use  . Vaping Use: Never used  Substance and Sexual Activity  . Alcohol use: Yes    Comment: social  . Drug use: No  . Sexual activity: Yes    Partners: Male  Other Topics Concern  . Not on file  Social History Narrative   Marital status/children/pets: Married, 3 children.    Education/employment: Oncologist, healthcare advocate   Safety:      -smoke alarm in the home:Yes     - wears seatbelt: Yes     - Feels safe in their relationships: Yes   Social Determinants of Health   Financial Resource Strain:   . Difficulty of Paying Living Expenses: Not on file  Food Insecurity:   . Worried About Programme researcher, broadcasting/film/video in the Last Year: Not on file  . Ran Out of Food in the Last Year: Not on file  Transportation Needs:   .  Lack of Transportation (Medical): Not on file  . Lack of Transportation (Non-Medical): Not on file  Physical Activity:   . Days of Exercise per Week: Not on file  . Minutes of Exercise per Session: Not on file  Stress:   . Feeling of Stress : Not on file  Social Connections:   . Frequency of Communication with Friends and Family: Not on file  . Frequency of Social Gatherings with Friends and Family: Not on file  . Attends Religious Services: Not on file  . Active Member of Clubs or Organizations: Not on file  . Attends Banker Meetings: Not on file  . Marital Status: Not on file  Intimate Partner Violence:   . Fear of Current or Ex-Partner: Not on file  . Emotionally Abused: Not on file  . Physically Abused: Not on file  . Sexually Abused: Not on file     Objective:  *** General: No acute distress.  Patient appears well-groomed.   Head:  Normocephalic/atraumatic Eyes:  Fundi examined but not visualized Neck: supple, no paraspinal tenderness, full range of motion Heart:  Regular rate and rhythm Lungs:   Clear to auscultation bilaterally Back: No paraspinal tenderness Neurological Exam: alert and oriented to person, place, and time. Attention span and concentration intact, recent and remote memory intact, fund of knowledge intact.  Speech fluent and not dysarthric, language intact.  CN II-XII intact. Bulk and tone normal, muscle strength 5/5 throughout.  Sensation to light touch, temperature and vibration intact.  Deep tendon reflexes 2+ throughout, toes downgoing.  Finger to nose and heel to shin testing intact.  Gait normal, Romberg negative.   Assessment/Plan:   Menstrual migraines, not intractable  1.  Migraine prevention:  Aimovig 70mg  monthly 2.  Migraine rescue:  Ubrelvy 100mg  3.  Limit use of pain relievers to no more than 2 days out of week to prevent risk of rebound or medication-overuse headache. 4.  Keep headache diary 5.  Follow up one year  , DO  CC: , DO

## 2020-05-24 ENCOUNTER — Ambulatory Visit: Payer: BC Managed Care – PPO | Admitting: Neurology

## 2020-05-29 ENCOUNTER — Ambulatory Visit: Payer: BC Managed Care – PPO | Admitting: Family Medicine

## 2020-05-29 DIAGNOSIS — M5126 Other intervertebral disc displacement, lumbar region: Secondary | ICD-10-CM | POA: Diagnosis not present

## 2020-05-29 DIAGNOSIS — Z6835 Body mass index (BMI) 35.0-35.9, adult: Secondary | ICD-10-CM | POA: Diagnosis not present

## 2020-05-31 ENCOUNTER — Other Ambulatory Visit: Payer: Self-pay | Admitting: Orthopedic Surgery

## 2020-05-31 DIAGNOSIS — M5126 Other intervertebral disc displacement, lumbar region: Secondary | ICD-10-CM

## 2020-06-01 ENCOUNTER — Encounter: Payer: Self-pay | Admitting: Family Medicine

## 2020-06-01 ENCOUNTER — Ambulatory Visit (INDEPENDENT_AMBULATORY_CARE_PROVIDER_SITE_OTHER): Payer: BC Managed Care – PPO | Admitting: Family Medicine

## 2020-06-01 ENCOUNTER — Other Ambulatory Visit: Payer: Self-pay

## 2020-06-01 VITALS — BP 94/66 | HR 68 | Temp 98.5°F | Ht 63.5 in | Wt 208.0 lb

## 2020-06-01 DIAGNOSIS — Z713 Dietary counseling and surveillance: Secondary | ICD-10-CM

## 2020-06-01 DIAGNOSIS — E669 Obesity, unspecified: Secondary | ICD-10-CM | POA: Diagnosis not present

## 2020-06-01 MED ORDER — FAMOTIDINE 20 MG PO TABS: 20.0000 mg | ORAL_TABLET | Freq: Two times a day (BID) | ORAL | 5 refills | Status: DC

## 2020-06-01 NOTE — Progress Notes (Signed)
Patient ID: Sheila Mathews, female  DOB: 1975/12/12, 44 y.o.   MRN: [id number] Patient Care Team    Relationship Specialty Notifications Start End  Natalia Leatherwood, DO PCP - General Family Medicine  05/05/20     Chief Complaint  Patient presents with  . Follow-up    [facility]    Subjective: Sheila Mathews is a 44 y.o.  female present for obesity/weight loss counseling Food journal/obesity/weight loss: [place] weight today 208. Body mass index is 36.27 kg/m. Patient brings her food journal with her today.  Overall she seems to consume healthy diet however it is carbohydrate heavy.  She currently is not exercising secondary to orthopedic pain/injury.  She has an upcoming surgery that will hopefully help her get the issue corrected and get her back into the gym and able to exercise.  Patient reports she has struggled with her weight for some time.  When she was able to exercise routinely and watch her diet she was able to lose weight.  She states she has felt her best when she was approximately 140 pounds.  Depression screen PHQ 2/9 05/05/2020  Decreased Interest 1  Down, Depressed, Hopeless 1  PHQ - 2 Score 2  Altered sleeping 3  Tired, decreased energy 1  Change in appetite 3  Feeling bad or failure about yourself  1  Trouble concentrating 0  Moving slowly or fidgety/restless 0  Suicidal thoughts 0  PHQ-9 Score 10   GAD 7 : Generalized Anxiety Score 05/05/2020  Nervous, Anxious, on Edge 2  Control/stop worrying 0  Worry too much - different things 1  Trouble relaxing 1  Restless 1  Easily annoyed or irritable 0  Afraid - awful might happen 0  Total GAD 7 Score 5       Fall Risk  04/21/2019  Falls in the past year? 0  Number falls in past yr: 0  Injury with Fall? 0   Immunization History  Administered Date(s) Administered  . Influenza,inj,Quad PF,6+ Mos 04/02/2019, 05/05/2020  . Influenza,inj,quad, With Preservative 07/01/2016  . Td 07/21/2014, 10/17/2016  . Tdap  07/21/2014    No exam data present  Past Medical History:  Diagnosis Date  . Abnormal cervical Papanicolaou smear 04/20/2020  . Allergies   . Anxiety   . Bipolar disorder (HCC)   . Chicken pox   . Chronic sinusitis 07/26/2016  . Complication of anesthesia    pt doesn't like mask she has panic attack; childhood  . Depression   . GERD (gastroesophageal reflux disease)   . Migraines   . Pelvic pain in female 05/13/2014  . Pneumonia   . PONV (postoperative nausea and vomiting)    no issues with short procdures, had one isntance of vomitus following a longer procedure   . Sciatica    Allergies  Allergen Reactions  . Almond (Diagnostic) Anaphylaxis  . Codeine Other (See Comments)    Makes patient aggressive    . Hydrocodone Other (See Comments)    Makes patient aggressive    Past Surgical History:  Procedure Laterality Date  . CESAREAN SECTION  x2   2000, 2005  . LAPAROSCOPY N/A 05/13/2014   Procedure: LAPAROSCOPY DIAGNOSTIC;  Surgeon: Lavina Hamman, MD;  Location: [facility];  Service: Gynecology;  Laterality: N/A;  . LUMBAR LAMINECTOMY/DECOMPRESSION MICRODISCECTOMY Left 02/12/2017   Procedure: Microlumbar decompression L5-S1 left ;  Surgeon: Jene Every, MD;  Location: [facility];  Service: Orthopedics;  Laterality: Left;  90 mins  . SALIVARY  GLAND SURGERY  1986  . UPPER GI ENDOSCOPY    . WISDOM TOOTH EXTRACTION     Family History  Problem Relation Age of Onset  . Cancer Maternal Grandmother        breast   . Breast cancer Maternal Grandmother   . Cancer Maternal Grandfather        head neck   . Diabetes Maternal Grandfather   . Cancer Paternal Grandmother        gallbladder  . Cancer Paternal Grandfather        colon   . Diabetes Paternal Grandfather   . Depression Mother   . Mental illness Mother   . Diabetes Brother   . Drug abuse Brother   . Early death Brother   . Mental illness Brother    Social History   Social History Narrative   Marital  status/children/pets: Married, 3 children.    Education/employment: Bachelor's degree, healthcare advocate   Safety:      -smoke alarm in the home:Yes     - wears seatbelt: Yes     - Feels safe in their relationships: Yes    Allergies as of 06/01/2020      Reactions   Almond (diagnostic) Anaphylaxis   Almond Oil    Codeine Other (See Comments)   Makes patient aggressive  Makes patient aggressive    Hydrocodone Other (See Comments)   Makes patient aggressive       Medication List       Accurate as of June 01, 2020 11:59 PM. If you have any questions, ask your nurse or doctor.        STOP taking these medications   cephALEXin 500 MG capsule Commonly known as: KEFLEX Stopped by: Felix Pacini, DO     TAKE these medications   Acetaminophen-Codeine 300-30 MG tablet Take by mouth daily as needed.   Aimovig 70 MG/ML Soaj Generic drug: Erenumab-aooe Inject 70 mg into the skin every 30 (thirty) days.   azelastine 0.1 % nasal spray Commonly known as: ASTELIN Place 2 sprays into both nostrils 2 (two) times daily.   clonazePAM 0.5 MG tablet Commonly known as: KLONOPIN TAKE 1 TABLET BY MOUTH 3 TIMES A DAY AS NEEDED FOR ANXIETY   famotidine 20 MG tablet Commonly known as: PEPCID Take 1 tablet (20 mg total) by mouth 2 (two) times daily. Started by: Felix Pacini, DO   gabapentin 300 MG capsule Commonly known as: NEURONTIN Take 900 mg by mouth 2 (two) times daily.   hydrocortisone 1 % ointment Apply 1 application topically 2 (two) times daily.   ketorolac 10 MG tablet Commonly known as: TORADOL ketorolac 10 mg tablet  TAKE 1 TABLET BY MOUTH EVERY 6 HOURS FOR 5 DAYS   lamoTRIgine 150 MG tablet Commonly known as: LAMICTAL Take 150 mg by mouth daily.   levothyroxine 25 MCG tablet Commonly known as: SYNTHROID Take 1 tablet (25 mcg total) by mouth daily.   lithium carbonate 300 MG CR tablet Commonly known as: LITHOBID Take by mouth.   methocarbamol 500 MG  tablet Commonly known as: ROBAXIN methocarbamol 500 mg tablet  TAKE 1 TABLET EVERY 6 8 HOURS BY ORAL ROUTE AS NEEDED FOR 10 DAYS.   pantoprazole 40 MG tablet Commonly known as: PROTONIX Take 1 tablet (40 mg total) by mouth daily.   sertraline 100 MG tablet Commonly known as: ZOLOFT Take 150 mg by mouth daily.   SUMAtriptan 50 MG tablet Commonly known as: IMITREX Take by mouth.  zolpidem 10 MG tablet Commonly known as: AMBIEN Take 10 mg by mouth at bedtime as needed.       All past medical history, surgical history, allergies, family history, immunizations andmedications were updated in the EMR today and reviewed under the history and medication portions of their EMR.      ROS: 14 pt review of systems performed and negative (unless mentioned in an HPI)  Objective: BP 94/66   Pulse 68   Temp 98.5 F (36.9 C) (Oral)   Ht 5' 3.5" (1.613 m)   Wt 208 lb (94.3 kg)   SpO2 97%   BMI 36.27 kg/m  Gen: Afebrile. No acute distress.  HENT: AT. [place].  Eyes:Pupils Equal Round Reactive to light, Extraocular movements intact,  Conjunctiva without redness, discharge or icterus.y Neuro: Normal gait. PERLA. EOMi. Alert. Oriented x3 Psych: Normal affect, dress and demeanor. Normal speech. Normal thought content and judgment.  Assessment/plan: Sheila Mathews is a 44 y.o. female present for  Obesity (BMI 30-39.9)/Weight loss counseling, encounter for Starting weight 208.Body mass index is 36.27 kg/m. Initial weight goal of 170. Reviewed her food diary with her today in great detail.  Her diet is very sugar and carb heavy. Goals: exercise, calorie counting, decrease carbs.  Count calories-patient was encouraged to weigh weekly and adjust caloric need via the calorie calculator> https://www.calculator.net/calorie-calculator.html -Weight loss medications would not be feasible for her secondary to her current regimen through psychiatry. -Patient understands to always meet minimum calorie  goals daily. -exercise goal of 150 minutes a week (plus warm up and cool down) of cardiovascular exercise.  -Patient was educated on heart rate for cardiovascular and fat burning zones> Heart rate ~145-155. - maintain adequate water consumption of at least 120 ounces a day, more if exercising/sweating. Avoid sugar, wht flour, wht rice, pasta and white potatoes.  Calories via lean meats, veggies and berries.  Follow-up every 4-8 weeks per her desire to be followed for weight loss counseling.    Return in about 4 weeks (around 06/29/2020).  No orders of the defined types were placed in this encounter.  Meds ordered this encounter  Medications  . famotidine (PEPCID) 20 MG tablet    Sig: Take 1 tablet (20 mg total) by mouth 2 (two) times daily.    Dispense:  60 tablet    Refill:  5   Referral Orders  No referral(s) requested today      Note is dictated utilizing voice recognition software. Although note has been proof read prior to signing, occasional typographical errors still can be missed. If any questions arise, please do not hesitate to call for verification.  Electronically signed by: Felix Pacini, DO [facility] [place]

## 2020-06-01 NOTE — Patient Instructions (Addendum)
  Goals: exercise, calorie counting, decrease carbs.  Count calories- weight weekly and adjust. https://www.calculator.net/calorie-calculator.html   weight loss and potential medications to help with weight loss today. - meet calorie goals  - dietary changes to not only lose weight but to eat healthy.  Patient was educated on glycemic index. -exercise goal of 150 minutes a week (plus warm up and cool down) of cardiovascular exercise.  -Patient was educated on heart rate for cardiovascular and fat burning zones> Heart rate ~145-155. - maintain adequate water consumption of at least 120 ounces a day, more if exercising/sweating.  Avoid sugar, flour, rice, pasta and white potatoes.  Calories via lean meats, veggies and berries.    If you want check ins for weight loss- we can see each other

## 2020-06-02 NOTE — Discharge Instructions (Signed)

## 2020-06-05 ENCOUNTER — Ambulatory Visit
Admission: RE | Admit: 2020-06-05 | Discharge: 2020-06-05 | Disposition: A | Discharge status: Home / Self Care | Payer: BC Managed Care – PPO | Source: Ambulatory Visit | Attending: Orthopedic Surgery | Admitting: Orthopedic Surgery

## 2020-06-05 ENCOUNTER — Ambulatory Visit: Payer: BC Managed Care – PPO | Admitting: Family Medicine

## 2020-06-05 ENCOUNTER — Encounter: Payer: Self-pay | Admitting: Family Medicine

## 2020-06-05 ENCOUNTER — Other Ambulatory Visit: Payer: Self-pay

## 2020-06-05 VITALS — BP 111/69 | HR 65 | Resp 12

## 2020-06-05 DIAGNOSIS — M5126 Other intervertebral disc displacement, lumbar region: Secondary | ICD-10-CM

## 2020-06-05 DIAGNOSIS — Z9889 Other specified postprocedural states: Secondary | ICD-10-CM

## 2020-06-05 DIAGNOSIS — M545 Low back pain, unspecified: Secondary | ICD-10-CM | POA: Diagnosis not present

## 2020-06-05 DIAGNOSIS — Z713 Dietary counseling and surveillance: Secondary | ICD-10-CM | POA: Insufficient documentation

## 2020-06-05 DIAGNOSIS — M5136 Other intervertebral disc degeneration, lumbar region: Secondary | ICD-10-CM

## 2020-06-05 MED ORDER — CEFAZOLIN SODIUM-DEXTROSE 2-4 GM/100ML-% IV SOLN
2.0000 g | Freq: Once | INTRAVENOUS | Status: AC
  Administered 2020-06-05: 2 g via INTRAVENOUS

## 2020-06-05 MED ORDER — SODIUM CHLORIDE 0.9 % IV SOLN: Freq: Once | INTRAVENOUS | Status: AC

## 2020-06-05 MED ORDER — KETOROLAC TROMETHAMINE 30 MG/ML IJ SOLN
30.0000 mg | Freq: Once | INTRAMUSCULAR | Status: AC
  Administered 2020-06-05: 30 mg via INTRAVENOUS

## 2020-06-05 MED ORDER — FENTANYL CITRATE (PF) 100 MCG/2ML IJ SOLN
25.0000 ug | INTRAMUSCULAR | Status: DC | PRN
  Administered 2020-06-05 (×2): 25 ug via INTRAVENOUS

## 2020-06-05 MED ORDER — METHYLPREDNISOLONE ACETATE 40 MG/ML INJ SUSP (RADIOLOG
120.0000 mg | Freq: Once | INTRAMUSCULAR | Status: AC
  Administered 2020-06-05: 120 mg via INTRALESIONAL

## 2020-06-05 MED ORDER — MIDAZOLAM HCL 2 MG/2ML IJ SOLN
1.0000 mg | INTRAMUSCULAR | Status: DC | PRN
  Administered 2020-06-05: 1 mg via INTRAVENOUS

## 2020-06-06 ENCOUNTER — Telehealth: Payer: Self-pay | Admitting: Family Medicine

## 2020-06-06 ENCOUNTER — Encounter: Payer: Self-pay | Admitting: Family Medicine

## 2020-06-06 ENCOUNTER — Ambulatory Visit (INDEPENDENT_AMBULATORY_CARE_PROVIDER_SITE_OTHER): Payer: BC Managed Care – PPO | Admitting: Family Medicine

## 2020-06-06 VITALS — BP 109/72 | HR 60 | Temp 98.5°F | Ht 63.5 in | Wt 206.0 lb

## 2020-06-06 DIAGNOSIS — Z130 Encounter for screening for diseases of the blood and blood-forming organs and certain disorders involving the immune mechanism: Secondary | ICD-10-CM

## 2020-06-06 DIAGNOSIS — Z131 Encounter for screening for diabetes mellitus: Secondary | ICD-10-CM | POA: Diagnosis not present

## 2020-06-06 DIAGNOSIS — Z01818 Encounter for other preprocedural examination: Secondary | ICD-10-CM

## 2020-06-06 LAB — COMPREHENSIVE METABOLIC PANEL
ALT: 8 U/L (ref 0–35)
AST: 10 U/L (ref 0–37)
Albumin: 4.3 g/dL (ref 3.5–5.2)
Alkaline Phosphatase: 50 U/L (ref 39–117)
BUN: 11 mg/dL (ref 6–23)
CO2: 27 mEq/L (ref 19–32)
Calcium: 9.2 mg/dL (ref 8.4–10.5)
Chloride: 107 mEq/L (ref 96–112)
Creatinine, Ser: 0.74 mg/dL (ref 0.40–1.20)
GFR: 100.03 mL/min (ref >60.00)
Glucose, Bld: 99 mg/dL (ref 70–99)
Potassium: 4 mEq/L (ref 3.5–5.1)
Sodium: 138 mEq/L (ref 135–145)
Total Bilirubin: 0.4 mg/dL (ref 0.2–1.2)
Total Protein: 6.8 g/dL (ref 6.0–8.3)

## 2020-06-06 LAB — CBC WITH DIFFERENTIAL/PLATELET
Basophils Absolute: 0.1 10*3/uL (ref 0.0–0.1)
Basophils Relative: 0.6 % (ref 0.0–3.0)
Eosinophils Absolute: 0.2 10*3/uL (ref 0.0–0.7)
Eosinophils Relative: 2 % (ref 0.0–5.0)
HCT: 36.5 % (ref 36.0–46.0)
Hemoglobin: 11.9 g/dL — ABNORMAL LOW (ref 12.0–15.0)
Lymphocytes Relative: 18.1 % (ref 12.0–46.0)
Lymphs Abs: 2 10*3/uL (ref 0.7–4.0)
MCHC: 32.5 g/dL (ref 30.0–36.0)
MCV: 85.9 fl (ref 78.0–100.0)
Monocytes Absolute: 0.7 10*3/uL (ref 0.1–1.0)
Monocytes Relative: 6.2 % (ref 3.0–12.0)
Neutro Abs: 8.1 10*3/uL — ABNORMAL HIGH (ref 1.4–7.7)
Neutrophils Relative %: 73.1 % (ref 43.0–77.0)
Platelets: 412 10*3/uL — ABNORMAL HIGH (ref 150.0–400.0)
RBC: 4.26 Mil/uL (ref 3.87–5.11)
RDW: 15 % (ref 11.5–15.5)
WBC: 11.1 10*3/uL — ABNORMAL HIGH (ref 4.0–10.5)

## 2020-06-06 LAB — HEMOGLOBIN A1C: Hgb A1c MFr Bld: 5.2 % (ref 4.6–6.5)

## 2020-06-06 NOTE — Patient Instructions (Signed)
I hope you have your surgery soon!

## 2020-06-06 NOTE — Telephone Encounter (Signed)
Presurgical clearance completed. Please fax OV note and clearance form, along with recent labs to her ortho team Placed in CMA work basket

## 2020-06-06 NOTE — Progress Notes (Addendum)
This visit occurred during the SARS-CoV-2 public health emergency.  Safety protocols were in place, including screening questions prior to the visit, additional usage of staff PPE, and extensive cleaning of exam room while observing appropriate contact time as indicated for disinfecting solutions.    Sheila Mathews , 01/08/1976, 44 y.o., female MRN: [id number] Patient Care Team    Relationship Specialty Notifications Start End  Ma Hillock, DO PCP - General Family Medicine  05/05/20     Chief Complaint  Patient presents with  . Surgical Clearance    pt is fasting     Subjective: Pt presents for an OV pre-op surgical clearance request from ortho.  Procedure: L4-L5 fusion Indication: Ruptured disc Anesthesia: unknown Surgery type risk:   - Intermediate risk= orthopedics Prior anesthesia complications: none-other than PONV (used scopolamine) Family history of prior anesthesia complications: none Cardiac:    - CBD, PAD, stroke, MI, aortic stenosis: no history   - METs: > 4 METS Pulmonary: COPD, smoker, asthma, sleep apnea, dyspnea: no history Endocrine: no prior history- a1c collected  Obesity:Body mass index is 35.92 kg/m. Chronic kidney disease:no known history- cmp collected today Chronic med that needs to be continued: mental health medications should be continued and thyroid med.  Anticoagulation: none  Allergies  Allergen Reactions  . Almond (Diagnostic) Anaphylaxis  . Codeine Other (See Comments)    Makes patient aggressive    . Hydrocodone Other (See Comments)    Makes patient aggressive    Social History   Social History Narrative   Marital status/children/pets: Married, 3 children.    Education/employment: Dietitian, healthcare advocate   Safety:      -smoke alarm in the home:Yes     - wears seatbelt: Yes     - Feels safe in their relationships: Yes   Past Medical History:  Diagnosis Date  . Abnormal cervical Papanicolaou smear  04/20/2020  . Allergies   . Anxiety   . Bipolar disorder ([place])   . Chicken pox   . Chronic sinusitis 07/26/2016  . Complication of anesthesia    pt doesn't like mask she has panic attack; childhood  . Depression   . GERD (gastroesophageal reflux disease)   . Migraines   . Pelvic pain in female 05/13/2014  . Pneumonia   . PONV (postoperative nausea and vomiting)    no issues with short procdures, had one isntance of vomitus following a longer procedure   . Sciatica    Past Surgical History:  Procedure Laterality Date  . CESAREAN SECTION  x2   2000, 2005  . LAPAROSCOPY N/A 05/13/2014   Procedure: LAPAROSCOPY DIAGNOSTIC;  Surgeon: Cheri Fowler, MD;  Location: [facility];  Service: Gynecology;  Laterality: N/A;  . LUMBAR LAMINECTOMY/DECOMPRESSION MICRODISCECTOMY Left 02/12/2017   Procedure: Microlumbar decompression L5-S1 left ;  Surgeon: Susa Day, MD;  Location: [facility];  Service: Orthopedics;  Laterality: Left;  90 mins  . SALIVARY GLAND SURGERY  1986  . UPPER GI ENDOSCOPY    . WISDOM TOOTH EXTRACTION     Family History  Problem Relation Age of Onset  . Cancer Maternal Grandmother        breast   . Breast cancer Maternal Grandmother   . Cancer Maternal Grandfather        head neck   . Diabetes Maternal Grandfather   . Cancer Paternal Grandmother        gallbladder  . Cancer Paternal Grandfather  colon   . Diabetes Paternal Grandfather   . Depression Mother   . Mental illness Mother   . Diabetes Brother   . Drug abuse Brother   . Early death Brother   . Mental illness Brother    Allergies as of 06/06/2020      Reactions   Almond (diagnostic) Anaphylaxis   Codeine Other (See Comments)   Makes patient aggressive    Hydrocodone Other (See Comments)   Makes patient aggressive       Medication List       Accurate as of June 06, 2020  4:31 PM. If you have any questions, ask your nurse or doctor.        STOP taking these medications    Acetaminophen-Codeine 300-30 MG tablet Stopped by: Howard Pouch, DO   ketorolac 10 MG tablet Commonly known as: TORADOL Stopped by: Howard Pouch, DO     TAKE these medications   Aimovig 70 MG/ML Soaj Generic drug: Erenumab-aooe Inject 70 mg into the skin every 30 (thirty) days.   azelastine 0.1 % nasal spray Commonly known as: ASTELIN Place 2 sprays into both nostrils 2 (two) times daily.   clonazePAM 0.5 MG tablet Commonly known as: KLONOPIN TAKE 1 TABLET BY MOUTH 3 TIMES A DAY AS NEEDED FOR ANXIETY   famotidine 20 MG tablet Commonly known as: PEPCID Take 1 tablet (20 mg total) by mouth 2 (two) times daily.   gabapentin 300 MG capsule Commonly known as: NEURONTIN Take 900 mg by mouth 2 (two) times daily.   hydrocortisone 1 % ointment Apply 1 application topically 2 (two) times daily.   lamoTRIgine 150 MG tablet Commonly known as: LAMICTAL Take 150 mg by mouth daily.   levothyroxine 25 MCG tablet Commonly known as: SYNTHROID Take 1 tablet (25 mcg total) by mouth daily.   lithium carbonate 300 MG CR tablet Commonly known as: LITHOBID Take by mouth.   methocarbamol 500 MG tablet Commonly known as: ROBAXIN methocarbamol 500 mg tablet  TAKE 1 TABLET EVERY 6 8 HOURS BY ORAL ROUTE AS NEEDED FOR 10 DAYS.   pantoprazole 40 MG tablet Commonly known as: PROTONIX Take 1 tablet (40 mg total) by mouth daily.   sertraline 100 MG tablet Commonly known as: ZOLOFT Take 150 mg by mouth daily.   SUMAtriptan 50 MG tablet Commonly known as: IMITREX Take by mouth.   zolpidem 10 MG tablet Commonly known as: AMBIEN Take 10 mg by mouth at bedtime as needed.       All past medical history, surgical history, allergies, family history, immunizations andmedications were updated in the EMR today and reviewed under the history and medication portions of their EMR.     ROS: Negative, with the exception of above mentioned in HPI   Objective:  BP 109/72   Pulse 60   Temp  98.5 F (36.9 C) (Oral)   Ht 5' 3.5" (1.613 m)   Wt 206 lb (93.4 kg)   SpO2 98%   BMI 35.92 kg/m  Body mass index is 35.92 kg/m. Gen: Afebrile. No acute distress. Nontoxic in appearance, well developed, well nourished.  HENT: AT. [place]. Bilateral TM visualized without erythema or bulding. MMM, no oral lesions. Bilateral nares without erythema or drainage. Throat without erythema or exudates. Small posterior pharynx.  No cough or hoarseness Eyes:Pupils Equal Round Reactive to light, Extraocular movements intact,  Conjunctiva without redness, discharge or icterus. Neck/lymp/endocrine: Supple,no lymphadenopathy, no thyromegaly. CV: RRR no murmur, no edema Chest: CTAB, no wheeze or  crackles. Good air movement, normal resp effort.  Abd: Soft. flat. NTND. BS present. no Masses palpated. No rebound or guarding.  Skin: no rashes, purpura or petechiae.  Neuro:  Normal gait. PERLA. EOMi. Alert. Oriented x3  Psych: Normal affect, dress and demeanor. Normal speech. Normal thought content and judgment.  No exam data present No results found. Results for orders placed or performed in visit on 06/06/20 (from the past 24 hour(s))  CBC w/Diff     Status: Abnormal   Collection Time: 06/06/20  8:42 AM  Result Value Ref Range   WBC 11.1 (H) 4.0 - 10.5 K/uL   RBC 4.26 3.87 - 5.11 Mil/uL   Hemoglobin 11.9 (L) 12.0 - 15.0 g/dL   HCT 36.5 36 - 46 %   MCV 85.9 78.0 - 100.0 fl   MCHC 32.5 30.0 - 36.0 g/dL   RDW 15.0 11.5 - 15.5 %   Platelets 412.0 (H) 150 - 400 K/uL   Neutrophils Relative % 73.1 43 - 77 %   Lymphocytes Relative 18.1 12 - 46 %   Monocytes Relative 6.2 3 - 12 %   Eosinophils Relative 2.0 0 - 5 %   Basophils Relative 0.6 0 - 3 %   Neutro Abs 8.1 (H) 1.4 - 7.7 K/uL   Lymphs Abs 2.0 0.7 - 4.0 K/uL   Monocytes Absolute 0.7 0.1 - 1.0 K/uL   Eosinophils Absolute 0.2 0.0 - 0.7 K/uL   Basophils Absolute 0.1 0.0 - 0.1 K/uL  Hemoglobin A1c     Status: None   Collection Time: 06/06/20  8:42 AM   Result Value Ref Range   Hgb A1c MFr Bld 5.2 4.6 - 6.5 %  Comp Met (CMET)     Status: None   Collection Time: 06/06/20  8:42 AM  Result Value Ref Range   Sodium 138 135 - 145 mEq/L   Potassium 4.0 3.5 - 5.1 mEq/L   Chloride 107 96 - 112 mEq/L   CO2 27 19 - 32 mEq/L   Glucose, Bld 99 70 - 99 mg/dL   BUN 11 6 - 23 mg/dL   Creatinine, Ser 0.74 0.40 - 1.20 mg/dL   Total Bilirubin 0.4 0.2 - 1.2 mg/dL   Alkaline Phosphatase 50 39 - 117 U/L   AST 10 0 - 37 U/L   ALT 8 0 - 35 U/L   Total Protein 6.8 6.0 - 8.3 g/dL   Albumin 4.3 3.5 - 5.2 g/dL   GFR 100.03 >60.00 mL/min   Calcium 9.2 8.4 - 10.5 mg/dL    Assessment/Plan: Sheila Mathews is a 44 y.o. female present for OV for pre-surgical clearance Preoperative clearance Pt is likely a low Surgical risk for Complications d/t medical conditions. She has an elevated BMI >35. Surgical clearance form, OV note and labs will be faxed to Speciality team as soon as results received from lab.  Body mass index is 35.92 kg/m. - Comp Met (CMET)> WNL recommendations to continue her mental health medications and her levothyroxine, as close to her normal time as possible.  Labs: CBC, BMP , A1c collected  Screening for deficiency anemia - CBC w/Diff> stable Hgb 11.9 Diabetes mellitus screening - Hemoglobin A1c> WNL   No patient is free of risk when undergoing a procedure. The decision about whether to proceed with the operation belongs to the surgeon and the patient.    Reviewed expectations re: course of current medical issues.  Discussed self-management of symptoms.  Outlined signs and symptoms indicating need  for more acute intervention.  Patient verbalized understanding and all questions were answered.  Patient received an After-Visit Summary.    Orders Placed This Encounter  Procedures  . CBC w/Diff  . Hemoglobin A1c  . Comp Met (CMET)   No orders of the defined types were placed in this encounter.  Referral Orders  No  referral(s) requested today     Note is dictated utilizing voice recognition software. Although note has been proof read prior to signing, occasional typographical errors still can be missed. If any questions arise, please do not hesitate to call for verification.   electronically signed by:  Howard Pouch, DO  [place]

## 2020-06-07 NOTE — Telephone Encounter (Signed)
OV notes printed 

## 2020-06-07 NOTE — Telephone Encounter (Signed)
Forms faxed

## 2020-06-09 DIAGNOSIS — M5459 Other low back pain: Secondary | ICD-10-CM | POA: Diagnosis not present

## 2020-06-13 DIAGNOSIS — M5136 Other intervertebral disc degeneration, lumbar region: Secondary | ICD-10-CM | POA: Diagnosis not present

## 2020-06-19 DIAGNOSIS — T819XXA Unspecified complication of procedure, initial encounter: Secondary | ICD-10-CM | POA: Insufficient documentation

## 2020-06-19 DIAGNOSIS — M5136 Other intervertebral disc degeneration, lumbar region: Secondary | ICD-10-CM | POA: Diagnosis not present

## 2020-07-04 ENCOUNTER — Telehealth (INDEPENDENT_AMBULATORY_CARE_PROVIDER_SITE_OTHER): Payer: Self-pay | Admitting: Family Medicine

## 2020-07-04 DIAGNOSIS — Z20822 Contact with and (suspected) exposure to covid-19: Secondary | ICD-10-CM

## 2020-07-04 DIAGNOSIS — R059 Cough, unspecified: Secondary | ICD-10-CM

## 2020-07-04 DIAGNOSIS — R0981 Nasal congestion: Secondary | ICD-10-CM

## 2020-07-04 MED ORDER — BENZONATATE 100 MG PO CAPS: 100.0000 mg | ORAL_CAPSULE | Freq: Three times a day (TID) | ORAL | 0 refills | Status: DC | PRN

## 2020-07-04 NOTE — Patient Instructions (Addendum)
   ---------------------------------------------------------------------------------------------------------------------------      WORK SLIP:  Patient MEYER DOCKERY,  March 15, 1976, was seen for a medical visit today, 07/04/20 . Please excuse from work according to the [facility] guidelines for a COVID like illness. We advise 10 days minimum from the onset of symptoms (06/28/20) PLUS 1 day of no fever and improved symptoms. Will defer to employer for a sooner return to work if COVID19 testing is negative and the symptoms have resolved. Advise following CDC guidelines.    Sincerely: E-signature: Dr. Kriste Basque, DO [facility] [place] Ph: [phone]   ------------------------------------------------------------------------------------------------------------------------------    HOME CARE TIPS:  Dolores Lory COVID19 testing information: [email or web address] [place] [phone] Most pharmacies also offer testing and home test kits.  -I sent the medication(s) we discussed to your pharmacy: Meds ordered this encounter  Medications  . benzonatate (TESSALON PERLES) 100 MG capsule    Sig: Take 1 capsule (100 mg total) by mouth 3 (three) times daily as needed.    Dispense:  20 capsule    Refill:  0     -can use tylenol or aleve if needed for fevers, aches and pains per instructions  -can use nasal saline a few times per day if nasal congestion, sometime a short course of Afrin nasal spray for 3 days can help as well  -stay hydrated, drink plenty of fluids and eat small healthy meals - avoid dairy  -can take 1000 IU Vit D3 and Vit C lozenges per instructions  -If the Covid test is positive, check out the CDC website for more information on home care, transmission and treatment for COVID19  -follow up with your doctor in 2-3 days unless improving and feeling better  -stay home while sick, except to seek medical care, and if you have  COVID19 please stay home for a full 10 days since the onset of symptoms PLUS one day of no fever and feeling better.  It was nice to meet you today, and I really hope you are feeling better soon. I help [facility] out with telemedicine visits on Tuesdays and Thursdays and am available for visits on those days. If you have any concerns or questions following this visit please schedule a follow up visit with your Primary Care doctor or seek care at a local urgent care clinic to avoid delays in care.    Seek in person care promptly if your symptoms worsen, new concerns arise or you are not improving with treatment. Call 911 and/or seek emergency care if you symptoms are severe or life threatening.

## 2020-07-04 NOTE — Progress Notes (Signed)
Virtual Visit via Video Note  I connected with Sheila Mathews  on 07/04/20 at  6:00 PM EST by a video enabled telemedicine application and verified that I am speaking with the correct person using two identifiers.  Location patient: home, [place] Location provider:work or home office Persons participating in the virtual visit: patient, provider  I discussed the limitations of evaluation and management by telemedicine and the availability of in person appointments. The patient expressed understanding and agreed to proceed.   HPI:  Acute telemedicine visit for covid19 exposure and resp illness: -Onset: 06/28/20 -Symptoms include: nasal congestion, cough, body aches, fever up to 102 high, chills -found out her daughter whom she had been around for christmas was positive for christmas -husband is sick as well -Denies:cp, sob, NVD, loss of taste or smell, inability to eat/drink get out of bed -Has tried: dayquil, nyquil -Pertinent past medical history: denies any -Pertinent medication allergies: codeine intolerance -COVID-19 vaccine status: not vaccinated for covid; vaccinated for flu  ROS: See pertinent positives and negatives per HPI.  Past Medical History:  Diagnosis Date  . Abnormal cervical Papanicolaou smear 04/20/2020  . Allergies   . Anxiety   . Bipolar disorder (HCC)   . Chicken pox   . Chronic sinusitis 07/26/2016  . Complication of anesthesia    pt doesn't like mask she has panic attack; childhood  . Depression   . GERD (gastroesophageal reflux disease)   . Migraines   . Pelvic pain in female 05/13/2014  . Pneumonia   . PONV (postoperative nausea and vomiting)    no issues with short procdures, had one isntance of vomitus following a longer procedure   . Sciatica     Past Surgical History:  Procedure Laterality Date  . CESAREAN SECTION  x2   2000, 2005  . LAPAROSCOPY N/A 05/13/2014   Procedure: LAPAROSCOPY DIAGNOSTIC;  Surgeon: Lavina Hamman, MD;  Location: [facility];   Service: Gynecology;  Laterality: N/A;  . LUMBAR LAMINECTOMY/DECOMPRESSION MICRODISCECTOMY Left 02/12/2017   Procedure: Microlumbar decompression L5-S1 left ;  Surgeon: Jene Every, MD;  Location: [facility];  Service: Orthopedics;  Laterality: Left;  90 mins  . SALIVARY GLAND SURGERY  1986  . UPPER GI ENDOSCOPY    . WISDOM TOOTH EXTRACTION       Current Outpatient Medications:  .  benzonatate (TESSALON PERLES) 100 MG capsule, Take 1 capsule (100 mg total) by mouth 3 (three) times daily as needed., Disp: 20 capsule, Rfl: 0 .  azelastine (ASTELIN) 0.1 % nasal spray, Place 2 sprays into both nostrils 2 (two) times daily. , Disp: , Rfl:  .  clonazePAM (KLONOPIN) 0.5 MG tablet, TAKE 1 TABLET BY MOUTH 3 TIMES A DAY AS NEEDED FOR ANXIETY, Disp: , Rfl:  .  Erenumab-aooe (AIMOVIG) 70 MG/ML SOAJ, Inject 70 mg into the skin every 30 (thirty) days., Disp: 1.12 mL, Rfl: 2 .  famotidine (PEPCID) 20 MG tablet, Take 1 tablet (20 mg total) by mouth 2 (two) times daily., Disp: 60 tablet, Rfl: 5 .  gabapentin (NEURONTIN) 300 MG capsule, Take 900 mg by mouth 2 (two) times daily., Disp: , Rfl: 0 .  hydrocortisone 1 % ointment, Apply 1 application topically 2 (two) times daily., Disp: 30 g, Rfl: 2 .  lamoTRIgine (LAMICTAL) 150 MG tablet, Take 150 mg by mouth daily., Disp: , Rfl:  .  levothyroxine (SYNTHROID) 25 MCG tablet, Take 1 tablet (25 mcg total) by mouth daily., Disp: 90 tablet, Rfl: 3 .  lithium carbonate (LITHOBID) 300 MG  CR tablet, Take by mouth., Disp: , Rfl:  .  methocarbamol (ROBAXIN) 500 MG tablet, methocarbamol 500 mg tablet  TAKE 1 TABLET EVERY 6 8 HOURS BY ORAL ROUTE AS NEEDED FOR 10 DAYS., Disp: , Rfl:  .  pantoprazole (PROTONIX) 40 MG tablet, Take 1 tablet (40 mg total) by mouth daily., Disp: 90 tablet, Rfl: 3 .  sertraline (ZOLOFT) 100 MG tablet, Take 150 mg by mouth daily., Disp: , Rfl:  .  SUMAtriptan (IMITREX) 50 MG tablet, Take by mouth., Disp: , Rfl:  .  zolpidem (AMBIEN) 10 MG tablet, Take  10 mg by mouth at bedtime as needed., Disp: , Rfl:   EXAM:  VITALS per patient if applicable:  GENERAL: alert, oriented, appears well and in no acute distress  HEENT: atraumatic, conjunttiva clear, no obvious abnormalities on inspection of external nose and ears  NECK: normal movements of the head and neck  LUNGS: on inspection no signs of respiratory distress, breathing rate appears normal, no obvious gross SOB, gasping or wheezing  CV: no obvious cyanosis  MS: moves all visible extremities without noticeable abnormality  PSYCH/NEURO: pleasant and cooperative, no obvious depression or anxiety, speech and thought processing grossly intact  ASSESSMENT AND PLAN:  Discussed the following assessment and plan:  Close exposure to COVID-19 virus  Cough  Nasal congestion  -we discussed possible serious and likely etiologies, options for evaluation and workup, limitations of telemedicine visit vs in person visit, treatment, treatment risks and precautions. Pt prefers to treat via telemedicine empirically rather than in person at this moment.  Likely COVID-19 given exposure and classic symptoms. She denies any risk factors for severe disease and opted against referral for outpatient treatment time.  She opted for Tessalon for cough, hydration, symptomatic care measures summarized in patient instructions.  Discussed options for testing.  Discussed treatment options, potential complications, isolation and precautions. Work/School slipped offered: provided in patient instructions   Scheduled follow up with PCP offered: She agrees to follow-up if needed. Advised to seek prompt in person care if worsening, new symptoms arise, or if is not improving with treatment. Discussed options for inperson care if PCP office not available. Did let this patient know that I only do telemedicine on Tuesdays and Thursdays for [place]. Advised to schedule follow up visit with PCP or [facility] if any further questions or  concerns to avoid delays in care.   I discussed the assessment and treatment plan with the patient. The patient was provided an opportunity to ask questions and all were answered. The patient agreed with the plan and demonstrated an understanding of the instructions.     Terressa Koyanagi, DO

## 2020-07-27 ENCOUNTER — Ambulatory Visit: Payer: BC Managed Care – PPO | Admitting: Family Medicine

## 2020-07-27 NOTE — Progress Notes (Signed)
NEUROLOGY FOLLOW UP OFFICE NOTE  MARCELLE HEPNER [id number]   Subjective:  Sheila Mathews is a 45 year old right-handed woman history of migraines, panic attacks, anxiety and allergic rhinitis who follows up for migraines.  UPDATE: Started Dennie Fetters in October 2020. Changed insurance in beginning of year so she hasn't had her dose this month.  She has had increased headaches since month, current headache lasting 4 days. Intensity:  2-3/10 but may have 10/10 once a month Duration:  If sumatriptan taken early, then lasts 1 to 2 hours, sometimes lasts 2 days (if hormonal related) Frequency:  3 to 4 a month Current NSAIDS:  none Current analgesics:  none Current triptans:  Sumatriptan 50mg  (helps for a little while but then returns - causes body aches) Current ergotamine:  none Current anti-emetic:  none Current muscle relaxants:  Robaxin Current anti-anxiolytic:  Klonopin Current sleep aide:  none Current Antihypertensive medications:  none Current Antidepressant/antipsychotic/mood medications:  Sertraline 150mg , Lithium Current Anticonvulsant medications:  Gabapentin 900mg  twice daily, lamotrigine 100mg  daily Current anti-CGRP:  Aimovig 70mg , Ubrelvy 100mg  Current Vitamins/Herbal/Supplements:  none Current Antihistamines/Decongestants:  Flonase Other therapy:  none Hormone/birth control:  none  Caffeine: 1 cup of coffee per day Alcohol: Occasionally Smoker: No Diet: 8 healthy. Drinks a lot of water. Exercise: Good Depression/stress: Anxiety controlled Other pain:  Back pain - plan for surgery.  Sleep hygiene: Wakes up during the night and difficulty falling back asleep  HISTORY: Onset: 45 years old Location: Usually right-sided, retro-orbital, and moves to the top of the head Quality: Usually pressure-like, sometimes stabbing Initial Intensity: Usually 6-7/10, 10 out of 10 for severe Aura: No Prodrome: No Associated symptoms: First severe, experiences  nausea, blurred vision, photophobia, phonophobia, and osmophobia. Initial Duration: Severe attacks last up to 4 days. Otherwise 2-3 hours Initial Frequency: Severe attacks occur once a month (4 days per month), otherwise other headaches occur 2 days per week. Total of 15-18 headache days per month. Triggers/exacerbating factors: Menstrual cycle Relieving factors: Phenergan/Toradol shots Activity: Cannot function with severe attacks  For years, she had headaches occurring 2 days a week, lasting 2-3 hours, but had severe migraines once a month lasting 4 days.  In 2019, they started occurring only twice a month but still lasting 4 days..  Past NSAIDS:  Ketorolac, naproxen Past analgesics:  Excedrin, Extra-strength Tylenol Past abortive triptans:  Relpax 40mg , Maxalt 10mg , sumatriptan injection Past abortive ergotamine:  none Past muscle relaxants:  Flexeril Past anti-emetic:  Zofran 4mg  Past antihypertensive medications:  No beta blockers as runs low HR Past antidepressant medications:  amitriptline 10mg  (side effects), citalopram Past anticonvulsant medications:  Depakote, topiramate Past anti-CGRP:  Ubrelvy 100mg  (ineffective) Other past therapies:  non   Family history of headache: No  PAST MEDICAL HISTORY: Past Medical History:  Diagnosis Date  . Abnormal cervical Papanicolaou smear 04/20/2020  . Allergies   . Anxiety   . Bipolar disorder (HCC)   . Chicken pox   . Chronic sinusitis 07/26/2016  . Complication of anesthesia    pt doesn't like mask she has panic attack; childhood  . Depression   . GERD (gastroesophageal reflux disease)   . Migraines   . Pelvic pain in female 05/13/2014  . Pneumonia   . PONV (postoperative nausea and vomiting)    no issues with short procdures, had one isntance of vomitus following a longer procedure   . Sciatica     MEDICATIONS: Current Outpatient Medications on File Prior to Visit  Medication Sig Dispense  Refill  . azelastine  (ASTELIN) 0.1 % nasal spray Place 2 sprays into both nostrils 2 (two) times daily.     . benzonatate (TESSALON PERLES) 100 MG capsule Take 1 capsule (100 mg total) by mouth 3 (three) times daily as needed. 20 capsule 0  . clonazePAM (KLONOPIN) 0.5 MG tablet TAKE 1 TABLET BY MOUTH 3 TIMES A DAY AS NEEDED FOR ANXIETY    . Erenumab-aooe (AIMOVIG) 70 MG/ML SOAJ Inject 70 mg into the skin every 30 (thirty) days. 1.12 mL 2  . famotidine (PEPCID) 20 MG tablet Take 1 tablet (20 mg total) by mouth 2 (two) times daily. 60 tablet 5  . gabapentin (NEURONTIN) 300 MG capsule Take 900 mg by mouth 2 (two) times daily.  0  . hydrocortisone 1 % ointment Apply 1 application topically 2 (two) times daily. 30 g 2  . lamoTRIgine (LAMICTAL) 150 MG tablet Take 150 mg by mouth daily.    Marland Kitchen levothyroxine (SYNTHROID) 25 MCG tablet Take 1 tablet (25 mcg total) by mouth daily. 90 tablet 3  . lithium carbonate (LITHOBID) 300 MG CR tablet Take by mouth.    . methocarbamol (ROBAXIN) 500 MG tablet methocarbamol 500 mg tablet  TAKE 1 TABLET EVERY 6 8 HOURS BY ORAL ROUTE AS NEEDED FOR 10 DAYS.    Marland Kitchen pantoprazole (PROTONIX) 40 MG tablet Take 1 tablet (40 mg total) by mouth daily. 90 tablet 3  . sertraline (ZOLOFT) 100 MG tablet Take 150 mg by mouth daily.    . SUMAtriptan (IMITREX) 50 MG tablet Take by mouth.    . zolpidem (AMBIEN) 10 MG tablet Take 10 mg by mouth at bedtime as needed.     No current facility-administered medications on file prior to visit.    ALLERGIES: Allergies  Allergen Reactions  . Almond (Diagnostic) Anaphylaxis  . Codeine Other (See Comments)    Makes patient aggressive    . Hydrocodone Other (See Comments)    Makes patient aggressive     FAMILY HISTORY: Family History  Problem Relation Age of Onset  . Cancer Maternal Grandmother        breast   . Breast cancer Maternal Grandmother   . Cancer Maternal Grandfather        head neck   . Diabetes Maternal Grandfather   . Cancer Paternal  Grandmother        gallbladder  . Cancer Paternal Grandfather        colon   . Diabetes Paternal Grandfather   . Depression Mother   . Mental illness Mother   . Diabetes Brother   . Drug abuse Brother   . Early death Brother   . Mental illness Brother     SOCIAL HISTORY: Social History   Socioeconomic History  . Marital status: Married    Spouse name: Not on file  . Number of children: 3  . Years of education: Not on file  . Highest education level: Bachelor's degree (e.g., BA, AB, BS)  Occupational History  . Not on file  Tobacco Use  . Smoking status: Never Smoker  . Smokeless tobacco: Never Used  Vaping Use  . Vaping Use: Never used  Substance and Sexual Activity  . Alcohol use: Yes    Comment: social  . Drug use: No  . Sexual activity: Yes    Partners: Male  Other Topics Concern  . Not on file  Social History Narrative   Marital status/children/pets: Married, 3 children.    Education/employment: Oncologist, healthcare  advocate   Safety:      -smoke alarm in the home:Yes     - wears seatbelt: Yes     - Feels safe in their relationships: Yes   Social Determinants of Health   Financial Resource Strain: Not on file  Food Insecurity: Not on file  Transportation Needs: Not on file  Physical Activity: Not on file  Stress: Not on file  Social Connections: Not on file  Intimate Partner Violence: Not on file     Objective:  Blood pressure 106/69, pulse 95, height 5\' 4"  (1.626 m), weight 200 lb (90.7 kg), SpO2 98 %. General: No acute distress.  Patient appears well-groomed.     Assessment/Plan:   Menstrual migraines  1.  Increase Aimovig to 140mg  monthly 2.  For rescue, increase sumatriptan to 100mg . Since she reports side effects to triptans, will have her try samples of Nurtec.  If effective, can send prescription 3.  Limit use of pain relievers to no more than 2 days out of week to prevent risk of rebound or medication-overuse headache. 4.  Keep  headache diary 5.  Follow up 6 months  , DO  CC: , DO

## 2020-07-28 ENCOUNTER — Other Ambulatory Visit: Payer: Self-pay

## 2020-07-28 ENCOUNTER — Encounter: Payer: Self-pay | Admitting: Neurology

## 2020-07-28 ENCOUNTER — Ambulatory Visit (INDEPENDENT_AMBULATORY_CARE_PROVIDER_SITE_OTHER): Payer: Managed Care, Other (non HMO) | Admitting: Neurology

## 2020-07-28 VITALS — BP 106/69 | HR 95 | Ht 64.0 in | Wt 200.0 lb

## 2020-07-28 DIAGNOSIS — G43839 Menstrual migraine, intractable, without status migrainosus: Secondary | ICD-10-CM | POA: Diagnosis not present

## 2020-07-28 MED ORDER — SUMATRIPTAN SUCCINATE 100 MG PO TABS: 100.0000 mg | ORAL_TABLET | Freq: Once | ORAL | 5 refills | Status: DC | PRN

## 2020-07-28 MED ORDER — AIMOVIG 140 MG/ML ~~LOC~~ SOAJ: 140.0000 mg | SUBCUTANEOUS | 5 refills | Status: DC

## 2020-07-28 NOTE — Patient Instructions (Signed)
1. Increase Aimovig to 140mg  every 28 days 2.  For rescue, take sumatriptan 100mg  at earliest onset.  May repeat in 2 hours.  I want you to try Nurtec for migraine rescue (only take 1 tablet in 24 hours).  If more effective than sumatriptan, contact me for prescription 3.  Limit use of pain relievers to no more than 2 days out of week to prevent risk of rebound or medication-overuse headache. 4.  Keep headache diary 5.  Follow up 6 months

## 2020-08-03 ENCOUNTER — Encounter: Payer: Self-pay | Admitting: Neurology

## 2020-08-03 NOTE — Progress Notes (Signed)
Conni Slipper (Key: [id number]) Rx #: [id number] Aimovig 140MG /ML auto-injectors   Form [employer] Electronic PA Form [phone] NCPDP) Created 6 days ago Sent to Plan 6 days ago Plan Response 6 days ago Submit Clinical Questions 6 days ago Determination Favorable 19 hours ago Message from Plan CaseId:[id number];Status:Approved;Review Type:Prior Auth;Coverage Start Date:07/28/2020;Coverage End Date:08/02/2021;

## 2020-08-23 ENCOUNTER — Encounter: Payer: Self-pay | Admitting: Neurology

## 2020-08-23 NOTE — Progress Notes (Signed)
Sheila Mathews [place] [address] [place] Case ID: [id number] Need help? Call us at [phone] Outcome Approvedtoday CaseId:[id number];Status:Approved;Review Type:Qty;Coverage Start Date:08/18/2020;Coverage End Date:08/23/2021; Drug SUMAtriptan Succinate 100MG  tablets Form [employer] Electronic PA Form [phone] NCPDP)

## 2020-09-08 ENCOUNTER — Ambulatory Visit: Payer: Self-pay | Admitting: Orthopedic Surgery

## 2020-09-08 ENCOUNTER — Telehealth: Payer: Self-pay | Admitting: Family Medicine

## 2020-09-08 ENCOUNTER — Other Ambulatory Visit: Payer: Self-pay

## 2020-09-08 DIAGNOSIS — R0989 Other specified symptoms and signs involving the circulatory and respiratory systems: Secondary | ICD-10-CM | POA: Insufficient documentation

## 2020-09-08 DIAGNOSIS — R198 Other specified symptoms and signs involving the digestive system and abdomen: Secondary | ICD-10-CM | POA: Insufficient documentation

## 2020-09-08 DIAGNOSIS — K589 Irritable bowel syndrome without diarrhea: Secondary | ICD-10-CM | POA: Insufficient documentation

## 2020-09-08 DIAGNOSIS — R1031 Right lower quadrant pain: Secondary | ICD-10-CM | POA: Insufficient documentation

## 2020-09-08 DIAGNOSIS — R1310 Dysphagia, unspecified: Secondary | ICD-10-CM | POA: Insufficient documentation

## 2020-09-08 DIAGNOSIS — K5900 Constipation, unspecified: Secondary | ICD-10-CM | POA: Insufficient documentation

## 2020-09-08 NOTE — Telephone Encounter (Signed)
Received faxed surgical clearance form from [facility]. Placed in Dr. Alan Ripper inbox to be signed.

## 2020-09-08 NOTE — Telephone Encounter (Signed)
Pt is required to get updated surgical clearance. Please advise if pt will need a new appt for this. Pt last seen in Dec.

## 2020-09-11 ENCOUNTER — Other Ambulatory Visit: Payer: Self-pay

## 2020-09-11 ENCOUNTER — Ambulatory Visit: Payer: Self-pay | Admitting: Orthopedic Surgery

## 2020-09-11 NOTE — Telephone Encounter (Signed)
This form has already been filled out for her once.  She was seen in December for her preoperative clearance for the surgery. Please print out prior form, which should be scanned into the system and send with a copy of preoperative clearance note from December.  Thanks.

## 2020-09-11 NOTE — Telephone Encounter (Signed)
Per Rosalva Ferron surgical clearance is only good for 90 days and they will a for stating no changes and pt is still cleared per Dr. Shon Baton.

## 2020-09-11 NOTE — Telephone Encounter (Signed)
Will place new form on providers desk

## 2020-09-11 NOTE — Telephone Encounter (Signed)
Pt will need appt then. Unfortunately, the only way to document "nothing has changed" is to evaluate and exam her.

## 2020-09-11 NOTE — Telephone Encounter (Signed)
Will call [facility] to see if new appt is necessary.

## 2020-09-12 ENCOUNTER — Ambulatory Visit (INDEPENDENT_AMBULATORY_CARE_PROVIDER_SITE_OTHER): Payer: Managed Care, Other (non HMO) | Admitting: Vascular Surgery

## 2020-09-12 ENCOUNTER — Other Ambulatory Visit: Payer: Self-pay

## 2020-09-12 ENCOUNTER — Encounter: Payer: Self-pay | Admitting: Vascular Surgery

## 2020-09-12 DIAGNOSIS — M5441 Lumbago with sciatica, right side: Secondary | ICD-10-CM

## 2020-09-12 DIAGNOSIS — M549 Dorsalgia, unspecified: Secondary | ICD-10-CM | POA: Insufficient documentation

## 2020-09-12 DIAGNOSIS — M5440 Lumbago with sciatica, unspecified side: Secondary | ICD-10-CM

## 2020-09-12 DIAGNOSIS — G8929 Other chronic pain: Secondary | ICD-10-CM | POA: Diagnosis not present

## 2020-09-12 NOTE — Progress Notes (Signed)
Patient name: Sheila Mathews MRN: [id number] DOB: Nov 09, 1975 Sex: female  REASON FOR CONSULT: Evaluate for L4-L5 OLIF  HPI: Sheila Mathews is a 45 y.o. female, with history depression that presents for evaluation of L4-L5 OLIF with Dr. Shon Baton.  She reports chronic lower back pain since 2016.  She previously underwent a L5-S1 microlumbar decompression with microdiscectomy in 2018 by Dr. Shelle Iron.  She has since undergone additional injections with some improvement in the left leg radiculopathy but otherwise has had persistent lower back pain.  She has been evaluated by Dr. Shon Baton who has recommended L4-L5 OLIF with posterior intstrumentation.  She describes two previous 2 C-sections and a laparoscopic procedure.  She works for [employer].  Three children.  Past Medical History:  Diagnosis Date  . Abnormal cervical Papanicolaou smear 04/20/2020  . Allergies   . Anxiety   . Bipolar disorder (HCC)   . Chicken pox   . Chronic sinusitis 07/26/2016  . Complication of anesthesia    pt doesn't like mask she has panic attack; childhood  . Depression   . GERD (gastroesophageal reflux disease)   . Migraines   . Pelvic pain in female 05/13/2014  . Pneumonia   . PONV (postoperative nausea and vomiting)    no issues with short procdures, had one isntance of vomitus following a longer procedure   . Sciatica     Past Surgical History:  Procedure Laterality Date  . CESAREAN SECTION  x2   2000, 2005  . LAPAROSCOPY N/A 05/13/2014   Procedure: LAPAROSCOPY DIAGNOSTIC;  Surgeon: Lavina Hamman, MD;  Location: [facility];  Service: Gynecology;  Laterality: N/A;  . LUMBAR LAMINECTOMY/DECOMPRESSION MICRODISCECTOMY Left 02/12/2017   Procedure: Microlumbar decompression L5-S1 left ;  Surgeon: Jene Every, MD;  Location: [facility];  Service: Orthopedics;  Laterality: Left;  90 mins  . SALIVARY GLAND SURGERY  1986  . UPPER GI ENDOSCOPY    . WISDOM TOOTH EXTRACTION      Family History  Problem Relation  Age of Onset  . Cancer Maternal Grandmother        breast   . Breast cancer Maternal Grandmother   . Cancer Maternal Grandfather        head neck   . Diabetes Maternal Grandfather   . Cancer Paternal Grandmother        gallbladder  . Cancer Paternal Grandfather        colon   . Diabetes Paternal Grandfather   . Depression Mother   . Mental illness Mother   . Diabetes Brother   . Drug abuse Brother   . Early death Brother   . Mental illness Brother     SOCIAL HISTORY: Social History   Socioeconomic History  . Marital status: Married    Spouse name: Not on file  . Number of children: 3  . Years of education: Not on file  . Highest education level: Bachelor's degree (e.g., BA, AB, BS)  Occupational History  . Not on file  Tobacco Use  . Smoking status: Never Smoker  . Smokeless tobacco: Never Used  Vaping Use  . Vaping Use: Never used  Substance and Sexual Activity  . Alcohol use: Yes    Comment: social  . Drug use: No  . Sexual activity: Yes    Partners: Male  Other Topics Concern  . Not on file  Social History Narrative   Marital status/children/pets: Married, 3 children.    Education/employment: Oncologist, healthcare advocate   Safety:      -  smoke alarm in the home:Yes     - wears seatbelt: Yes     - Feels safe in their relationships: Yes   Right handed   No caffeine   One story home   Social Determinants of Health   Financial Resource Strain: Not on file  Food Insecurity: Not on file  Transportation Needs: Not on file  Physical Activity: Not on file  Stress: Not on file  Social Connections: Not on file  Intimate Partner Violence: Not on file    Allergies  Allergen Reactions  . Almond (Diagnostic) Anaphylaxis  . Codeine Other (See Comments)    Makes patient aggressive    . Hydrocodone Other (See Comments)    Makes patient aggressive     Current Outpatient Medications  Medication Sig Dispense Refill  . clonazePAM (KLONOPIN) 0.5 MG  tablet Take 0.5 mg by mouth 3 (three) times daily as needed for anxiety.    Dorise Hiss (AIMOVIG) 140 MG/ML SOAJ Inject 140 mg into the skin every 28 (twenty-eight) days. 1.12 mL 5  . hydrocortisone 1 % ointment Apply 1 application topically 2 (two) times daily. 30 g 2  . lamoTRIgine (LAMICTAL) 200 MG tablet Take 200 mg by mouth daily.    Marland Kitchen levothyroxine (SYNTHROID) 25 MCG tablet Take 1 tablet (25 mcg total) by mouth daily. 90 tablet 3  . lithium carbonate (LITHOBID) 300 MG CR tablet Take 900 mg by mouth daily.    Marland Kitchen Plecanatide (TRULANCE) 3 MG TABS Take 3 mg by mouth daily.    . sertraline (ZOLOFT) 100 MG tablet Take 150 mg by mouth daily.    . SUMAtriptan (IMITREX) 100 MG tablet Take 100 mg by mouth every 2 (two) hours as needed for migraine.    Marland Kitchen zolpidem (AMBIEN) 10 MG tablet Take 10 mg by mouth at bedtime as needed for sleep.    . Esomeprazole Magnesium 20 MG TBEC Take 20 mg by mouth daily at 12 noon. (Patient not taking: Reported on 09/12/2020)    . famotidine (PEPCID) 20 MG tablet Take 1 tablet (20 mg total) by mouth 2 (two) times daily. (Patient not taking: No sig reported) 60 tablet 5  . pantoprazole (PROTONIX) 40 MG tablet Take 1 tablet (40 mg total) by mouth daily. (Patient not taking: No sig reported) 90 tablet 3   No current facility-administered medications for this visit.    REVIEW OF SYSTEMS:  [X]  denotes positive finding, [ ]  denotes negative finding Cardiac  Comments:  Chest pain or chest pressure:    Shortness of breath upon exertion:    Short of breath when lying flat:    Irregular heart rhythm:        Vascular    Pain in calf, thigh, or hip brought on by ambulation:    Pain in feet at night that wakes you up from your sleep:     Blood clot in your veins:    Leg swelling:         Pulmonary    Oxygen at home:    Productive cough:     Wheezing:         Neurologic    Sudden weakness in arms or legs:     Sudden numbness in arms or legs:     Sudden onset of  difficulty speaking or slurred speech:    Temporary loss of vision in one eye:     Problems with dizziness:         Gastrointestinal    Blood  in stool:     Vomited blood:         Genitourinary    Burning when urinating:     Blood in urine:        Psychiatric    Major depression:         Hematologic    Bleeding problems:    Problems with blood clotting too easily:        Skin    Rashes or ulcers:        Constitutional    Fever or chills:      PHYSICAL EXAM: Vitals:   09/12/20 1457  BP: 114/75  Pulse: 63  Resp: 14  Temp: 97.7 F (36.5 C)  TempSrc: Temporal  SpO2: 96%  Weight: 197 lb (89.4 kg)  Height: 5\' 6"  (1.676 m)    GENERAL: The patient is a well-nourished female, in no acute distress. The vital signs are documented above. CARDIAC: There is a regular rate and rhythm.  VASCULAR:  Palpable radial pulses bilateral upper extremities Femoral pulses palpable bilaterally Palpable DP pulses bilaterally PULMONARY: No respiratory distress. ABDOMEN: Soft and non-tender. MUSCULOSKELETAL: There are no major deformities or cyanosis. NEUROLOGIC: No focal weakness or paresthesias are detected. SKIN: There are no ulcers or rashes noted. PSYCHIATRIC: The patient has a normal affect.  DATA:   MRI disc with recent images from [facility] does not work and our office has requested a new copy for review.  Assessment/Plan:  45 year old female with chronic lower back pain with radiculopathy that presents for preop evaluation of L4-L5 OLIF with Dr. 45.  Unfortunately her MRI disc does not work today and we have requested a new copy from Shon Baton.  Otherwise she does not appear to have any issues that would prevent her from being a good candidate for L4-L5 OLIF.  I discussed incision over the left oblique muscles in the right lateral position and subsequently mobilizing the peritoneum as well as the left ureter and the left iliac vessels including artery and vein to expose  the L4-L5 disc space.  We talked about injury to the above structures.  Look forward to assisting Dr. [facility] and again I will review her images when our office gets a new disc.   Shon Baton, MD [facility] Office: [phone]

## 2020-09-13 ENCOUNTER — Other Ambulatory Visit: Payer: Self-pay

## 2020-09-14 ENCOUNTER — Ambulatory Visit (INDEPENDENT_AMBULATORY_CARE_PROVIDER_SITE_OTHER): Payer: Managed Care, Other (non HMO) | Admitting: Family Medicine

## 2020-09-14 ENCOUNTER — Encounter: Payer: Self-pay | Admitting: Family Medicine

## 2020-09-14 VITALS — BP 106/75 | HR 77 | Temp 99.0°F | Ht 64.0 in | Wt 199.0 lb

## 2020-09-14 DIAGNOSIS — Z01818 Encounter for other preprocedural examination: Secondary | ICD-10-CM

## 2020-09-14 DIAGNOSIS — F419 Anxiety disorder, unspecified: Secondary | ICD-10-CM | POA: Insufficient documentation

## 2020-09-14 DIAGNOSIS — R5382 Chronic fatigue, unspecified: Secondary | ICD-10-CM | POA: Insufficient documentation

## 2020-09-14 DIAGNOSIS — E785 Hyperlipidemia, unspecified: Secondary | ICD-10-CM | POA: Insufficient documentation

## 2020-09-14 DIAGNOSIS — J3089 Other allergic rhinitis: Secondary | ICD-10-CM | POA: Insufficient documentation

## 2020-09-14 DIAGNOSIS — G9332 Myalgic encephalomyelitis/chronic fatigue syndrome: Secondary | ICD-10-CM | POA: Insufficient documentation

## 2020-09-14 DIAGNOSIS — J309 Allergic rhinitis, unspecified: Secondary | ICD-10-CM | POA: Insufficient documentation

## 2020-09-14 DIAGNOSIS — E669 Obesity, unspecified: Secondary | ICD-10-CM | POA: Insufficient documentation

## 2020-09-14 NOTE — Telephone Encounter (Signed)
Form faxed

## 2020-09-14 NOTE — Patient Instructions (Signed)
Great to see you today.   Cleared for surgery.  Make sure to ask them if ok to take usual meds with water the morning of surgery. If not, make sure to have them give in post op.   Good luck.

## 2020-09-14 NOTE — Telephone Encounter (Signed)
Form completed and placed on CMA desk to fax with OV note to ortho.

## 2020-09-14 NOTE — Telephone Encounter (Signed)
OV note printed 

## 2020-09-14 NOTE — Progress Notes (Signed)
This visit occurred during the SARS-CoV-2 public health emergency.  Safety protocols were in place, including screening questions prior to the visit, additional usage of staff PPE, and extensive cleaning of exam room while observing appropriate contact time as indicated for disinfecting solutions.    Sheila Mathews , 03-Jul-1975, 45 y.o., female MRN: [id number] Patient Care Team    Relationship Specialty Notifications Start End  Ma Hillock, DO PCP - General Family Medicine  05/05/20     Chief Complaint  Patient presents with  . surgical clearance    Pt is fasting;      Subjective: Pt presents for an OV pre-op surgical clearance request from ortho.  Procedure: L4-L5 fusion Indication: Ruptured disc Anesthesia: TBD Surgery type risk:   - Intermediate risk= orthopedics Prior anesthesia complications: none-other than PONV (used scopolamine) Family history of prior anesthesia complications: none Cardiac:    - CBD, PAD, stroke, MI, aortic stenosis: no history   - METs:> 4 METS Pulmonary: COPD, smoker, asthma, sleep apnea, dyspnea: no history Endocrine: no prior history- a1c normal Obesity:Body mass index is 34.16 kg/m. Chronic kidney disease: no history Chronic med that needs to be continued: mental health medications should be continued and thyroid med.  Anticoagulation: none  CBC Latest Ref Rng & Units 06/06/2020 02/05/2017 05/04/2014  WBC 4.0 - 10.5 K/uL 11.1(H) 7.5 10.1  Hemoglobin 12.0 - 15.0 g/dL 11.9(L) 11.9(L) 12.5  Hematocrit 36.0 - 46.0 % 36.5 36.1 36.2  Platelets 150.0 - 400.0 K/uL 412.0(H) 402(H) 315   CMP Latest Ref Rng & Units 06/06/2020 05/05/2020 02/05/2017  Glucose 70 - 99 mg/dL 99 88 91  BUN 6 - 23 mg/dL '11 12 12  ' Creatinine 0.40 - 1.20 mg/dL 0.74 0.79 0.58  Sodium 135 - 145 mEq/L 138 140 140  Potassium 3.5 - 5.1 mEq/L 4.0 3.8 4.2  Chloride 96 - 112 mEq/L 107 106 105  CO2 19 - 32 mEq/L '27 25 25  ' Calcium 8.4 - 10.5 mg/dL 9.2 9.7 9.3  Total Protein 6.0 -  8.3 g/dL 6.8 7.2 -  Total Bilirubin 0.2 - 1.2 mg/dL 0.4 0.7 -  Alkaline Phos 39 - 117 U/L 50 51 -  AST 0 - 37 U/L 10 11 -  ALT 0 - 35 U/L 8 10 -   Lab Results  Component Value Date   HGBA1C 5.2 06/06/2020     Allergies  Allergen Reactions  . Almond (Diagnostic) Anaphylaxis  . Codeine Other (See Comments)    Makes patient aggressive    . Hydrocodone Other (See Comments)    Makes patient aggressive    Social History   Social History Narrative   Marital status/children/pets: Married, 3 children.    Education/employment: Dietitian, healthcare advocate   Safety:      -smoke alarm in the home:Yes     - wears seatbelt: Yes     - Feels safe in their relationships: Yes   Right handed   No caffeine   One story home   Past Medical History:  Diagnosis Date  . Abnormal cervical Papanicolaou smear 04/20/2020  . Allergies   . Anxiety   . Bipolar disorder ([place])   . Chicken pox   . Chronic sinusitis 07/26/2016  . Complication of anesthesia    pt doesn't like mask she has panic attack; childhood  . Depression   . GERD (gastroesophageal reflux disease)   . Migraines   . Pelvic pain in female 05/13/2014  . Pneumonia   .  PONV (postoperative nausea and vomiting)    no issues with short procdures, had one isntance of vomitus following a longer procedure   . Sciatica    Past Surgical History:  Procedure Laterality Date  . CESAREAN SECTION  x2   2000, 2005  . LAPAROSCOPY N/A 05/13/2014   Procedure: LAPAROSCOPY DIAGNOSTIC;  Surgeon: Cheri Fowler, MD;  Location: [facility];  Service: Gynecology;  Laterality: N/A;  . LUMBAR LAMINECTOMY/DECOMPRESSION MICRODISCECTOMY Left 02/12/2017   Procedure: Microlumbar decompression L5-S1 left ;  Surgeon: Susa Day, MD;  Location: [facility];  Service: Orthopedics;  Laterality: Left;  90 mins  . SALIVARY GLAND SURGERY  1986  . UPPER GI ENDOSCOPY    . WISDOM TOOTH EXTRACTION     Family History  Problem Relation Age of Onset  . Cancer  Maternal Grandmother        breast   . Breast cancer Maternal Grandmother   . Cancer Maternal Grandfather        head neck   . Diabetes Maternal Grandfather   . Cancer Paternal Grandmother        gallbladder  . Cancer Paternal Grandfather        colon   . Diabetes Paternal Grandfather   . Depression Mother   . Mental illness Mother   . Diabetes Brother   . Drug abuse Brother   . Early death Brother   . Mental illness Brother    Allergies as of 09/14/2020      Reactions   Almond (diagnostic) Anaphylaxis   Codeine Other (See Comments)   Makes patient aggressive    Hydrocodone Other (See Comments)   Makes patient aggressive       Medication List       Accurate as of September 14, 2020 10:47 AM. If you have any questions, ask your nurse or doctor.        STOP taking these medications   famotidine 20 MG tablet Commonly known as: PEPCID Stopped by: Howard Pouch, DO   hydrocortisone 1 % ointment Stopped by: Howard Pouch, DO   pantoprazole 40 MG tablet Commonly known as: PROTONIX Stopped by: Howard Pouch, DO     TAKE these medications   Aimovig 140 MG/ML Soaj Generic drug: Erenumab-aooe Inject 140 mg into the skin every 28 (twenty-eight) days.   clonazePAM 0.5 MG tablet Commonly known as: KLONOPIN Take 0.5 mg by mouth 3 (three) times daily as needed for anxiety.   Esomeprazole Magnesium 20 MG Tbec Take 20 mg by mouth daily at 12 noon.   lamoTRIgine 200 MG tablet Commonly known as: LAMICTAL Take 200 mg by mouth daily.   levothyroxine 25 MCG tablet Commonly known as: SYNTHROID Take 1 tablet (25 mcg total) by mouth daily.   lithium carbonate 300 MG CR tablet Commonly known as: LITHOBID Take 900 mg by mouth daily.   sertraline 100 MG tablet Commonly known as: ZOLOFT Take 150 mg by mouth daily.   SUMAtriptan 100 MG tablet Commonly known as: IMITREX Take 100 mg by mouth every 2 (two) hours as needed for migraine.   Trulance 3 MG Tabs Generic drug:  Plecanatide Take 3 mg by mouth daily.   zolpidem 10 MG tablet Commonly known as: AMBIEN Take 10 mg by mouth at bedtime as needed for sleep.       All past medical history, surgical history, allergies, family history, immunizations andmedications were updated in the EMR today and reviewed under the history and medication portions of their EMR.  ROS: Negative, with the exception of above mentioned in HPI   Objective:  BP 106/75   Pulse 77   Temp 99 F (37.2 C) (Oral)   Ht '5\' 4"'  (1.626 m)   Wt 199 lb (90.3 kg)   SpO2 99%   BMI 34.16 kg/m  Body mass index is 34.16 kg/m. Gen: Afebrile. No acute distress. Nontoxic, pleasant female.  HENT: AT. [place]. Bilateral TM visualized and normal in appearance. MMM. Bilateral nares . Throat without erythema or exudates. No cough or hoarseness.  Eyes:Pupils Equal Round Reactive to light, Extraocular movements intact,  Conjunctiva without redness, discharge or icterus. Neck/lymp/endocrine: Supple,no lymphadenopathy, no thyromegaly CV: RRR no murmur, no edema, +2/4 P posterior tibialis pulses Chest: CTAB, no wheeze or crackles Abd: Soft. flat. NTND. BS present. no Masses palpated.  Skin: no rashes, purpura or petechiae.  Neuro:  Normal gait. PERLA. EOMi. Alert. Oriented x3 Psych: Normal affect, dress and demeanor. Normal speech. Normal thought content and judgment.  Assessment/Plan: Sheila Mathews is a 45 y.o. female present for OV for pre-surgical clearance Preoperative clearance Pt is likely of low  Surgical risk for Complications d/t medical conditions. She has an elevated BMI >30. Surgical clearance form, OV note and labs faxed to surgical team today.  Body mass index is 34.16 kg/m. - Comp Met (CMET)> WNL recommendations to continue her mental health medications and her levothyroxine, as close to her normal time as possible.  Labs: CBC, BMP , A1c last 3 months Screening for deficiency anemia - CBC w/Diff> stable Hgb 11.9 Diabetes  mellitus screening - Hemoglobin A1c> WNL   No patient is free of risk when undergoing a procedure. The decision about whether to proceed with the operation belongs to the surgeon and the patient.    Reviewed expectations re: course of current medical issues.  Discussed self-management of symptoms.  Outlined signs and symptoms indicating need for more acute intervention.  Patient verbalized understanding and all questions were answered.  Patient received an After-Visit Summary.    No orders of the defined types were placed in this encounter.  No orders of the defined types were placed in this encounter.  Referral Orders  No referral(s) requested today     Note is dictated utilizing voice recognition software. Although note has been proof read prior to signing, occasional typographical errors still can be missed. If any questions arise, please do not hesitate to call for verification.   electronically signed by:  Howard Pouch, DO  [place]

## 2020-09-15 ENCOUNTER — Ambulatory Visit: Payer: Self-pay | Admitting: Orthopedic Surgery

## 2020-09-15 NOTE — H&P (Deleted)
  The note originally documented on this encounter has been moved the the encounter in which it belongs.  

## 2020-09-15 NOTE — H&P (Signed)
Subjective:   Sheila Mathews is a very pleasant 45 year old who had a lumbar microdiscectomy 2018 has had ongoing progressive low back and intermittent radicular left leg pain since. She states the quality-of-life has continued to deteriorate. She has had a L5 selective nerve root block the year ago which provided improvement, but a recent L5 selective nerve root block only served to intensify her pain. She did get significant temporary relief following an intradiscal injection which confirmend the disc as her primary pain generator. We have discussed risks and benefits of surgical intervention and the patient has elected to move forward with surgery. The patient is here today for a pre-operative History and Physical. They are scheduled for OLIF L4-5 WITH PSFI on 09-21-20 with Dr. Shon Baton at [facility].  Patient Active Problem List   Diagnosis Date Noted  . Allergic rhinitis 09/14/2020  . Anxiety 09/14/2020  . Chronic fatigue syndrome 09/14/2020  . Hyperlipidemia 09/14/2020  . Obesity 09/14/2020  . Chronic back pain 09/12/2020  . Constipation 09/08/2020  . Dysphagia 09/08/2020  . Globus sensation 09/08/2020  . Irritable bowel syndrome 09/08/2020  . Right lower quadrant pain 09/08/2020  . Complication of surgical procedure 06/19/2020  . Weight loss counseling, encounter for 06/05/2020  . Dry skin 05/05/2020  . Acquired hypothyroidism 05/05/2020  . Long-term current use of lithium 05/05/2020  . Bipolar 1 disorder (HCC) 05/05/2020  . PTSD (post-traumatic stress disorder) 05/05/2020  . Obsessive-compulsive disorder 05/05/2020  . Migraines   . History of lumbar laminectomy 08/01/2017  . Degeneration of lumbar intervertebral disc 08/01/2017  . Acid reflux 07/10/2017  . Intractable menstrual migraine without status migrainosus 01/24/2014   Past Medical History:  Diagnosis Date  . Abnormal cervical Papanicolaou smear 04/20/2020  . Allergies   . Anxiety   . Bipolar disorder (HCC)   .  Chicken pox   . Chronic sinusitis 07/26/2016  . Complication of anesthesia    pt doesn't like mask she has panic attack; childhood  . Depression   . GERD (gastroesophageal reflux disease)   . Migraines   . Pelvic pain in female 05/13/2014  . Pneumonia   . PONV (postoperative nausea and vomiting)    no issues with short procdures, had one isntance of vomitus following a longer procedure   . Sciatica     Past Surgical History:  Procedure Laterality Date  . CESAREAN SECTION  x2   2000, 2005  . LAPAROSCOPY N/A 05/13/2014   Procedure: LAPAROSCOPY DIAGNOSTIC;  Surgeon: Lavina Hamman, MD;  Location: [facility];  Service: Gynecology;  Laterality: N/A;  . LUMBAR LAMINECTOMY/DECOMPRESSION MICRODISCECTOMY Left 02/12/2017   Procedure: Microlumbar decompression L5-S1 left ;  Surgeon: Jene Every, MD;  Location: [facility];  Service: Orthopedics;  Laterality: Left;  90 mins  . SALIVARY GLAND SURGERY  1986  . UPPER GI ENDOSCOPY    . WISDOM TOOTH EXTRACTION      Current Outpatient Medications  Medication Sig Dispense Refill Last Dose  . clonazePAM (KLONOPIN) 0.5 MG tablet Take 0.5 mg by mouth 3 (three) times daily as needed for anxiety.     Dorise Hiss (AIMOVIG) 140 MG/ML SOAJ Inject 140 mg into the skin every 28 (twenty-eight) days. 1.12 mL 5   . Esomeprazole Magnesium 20 MG TBEC Take 20 mg by mouth daily at 12 noon.     . lamoTRIgine (LAMICTAL) 200 MG tablet Take 200 mg by mouth daily.     Marland Kitchen levothyroxine (SYNTHROID) 25 MCG tablet Take 1 tablet (25 mcg total) by mouth daily.  90 tablet 3   . lithium carbonate (LITHOBID) 300 MG CR tablet Take 900 mg by mouth daily.     Marland Kitchen Plecanatide (TRULANCE) 3 MG TABS Take 3 mg by mouth daily.     . sertraline (ZOLOFT) 100 MG tablet Take 150 mg by mouth daily.     . SUMAtriptan (IMITREX) 100 MG tablet Take 100 mg by mouth every 2 (two) hours as needed for migraine.     Marland Kitchen zolpidem (AMBIEN) 10 MG tablet Take 10 mg by mouth at bedtime as needed for sleep.      No  current facility-administered medications for this visit.   Allergies  Allergen Reactions  . Almond (Diagnostic) Anaphylaxis  . Codeine Other (See Comments)    Makes patient aggressive    . Hydrocodone Other (See Comments)    Makes patient aggressive     Social History   Tobacco Use  . Smoking status: Never Smoker  . Smokeless tobacco: Never Used  Substance Use Topics  . Alcohol use: Yes    Comment: social    Family History  Problem Relation Age of Onset  . Cancer Maternal Grandmother        breast   . Breast cancer Maternal Grandmother   . Cancer Maternal Grandfather        head neck   . Diabetes Maternal Grandfather   . Cancer Paternal Grandmother        gallbladder  . Cancer Paternal Grandfather        colon   . Diabetes Paternal Grandfather   . Depression Mother   . Mental illness Mother   . Diabetes Brother   . Drug abuse Brother   . Early death Brother   . Mental illness Brother     Review of Systems Pertinent items are noted in HPI.  Objective:   Vitals: Ht: 5 ft 3.5 in 09/15/2020 01:26 pm BP: 116/79 sitting L arm 09/15/2020 01:29 pm Pulse: 70 bpm 09/15/2020 01:29 pm   Clinical exam: Sheila Mathews is a pleasant individual, who appears younger than their stated age. She is alert and orientated 3. No shortness of breath, chest pain.  Heart: Regular rate and rhythm, no rubs, murmurs, or gallops  Lungs: Clear auscultation bilaterally  Abdomen is soft and non-tender, negative loss of bowel and bladder control, no rebound tenderness. Bowel sounds 4.  Negative: skin lesions abrasions contusions  Peripheral pulses: 2+ dorsalis pedis/posterior tibialis pulses bilaterally. Compartment soft and nontender.  Gait pattern: Normal Assistive devices: None  Neuro: Positive left straight leg raise test with reproduction of L5 radicular leg pain. Positive numbness and dysesthesias in the left lower extremity. Positive contralateral straight leg raise test with  reproduction of left L5 radicular pain. 5/5 motor strength in the lower extremity bilaterally.  Musculoskeletal: Significant low back pain radiating into the left lower extremity. No SI joint pain with direct palpation. No hip, knee, ankle pain with SI joint range of motion. Reduced range of motion of the low back due to severe pain.  X-rays of the lumbar spine demonstrate loss of normal lumbar lordosis with moderate degenerative disc disease at L4-5. No spondylolisthesis noted.  Lumbar MRI: completed on 04/03/20 was reviewed with the patient. It was completed at [facility]; I have independently reviewed the images as well as the radiology report. No disc herniation or stenosis L1-4. Prior surgical laminotomy noted at L4-5. Neural foramen are patent. There is disc protrusion versus granulation tissue contacting the left L5 nerve root. No disc herniation or  stenosis at L5-S1. Essentially no change from MRI completed on 05/21/19.  Assessment:   Sheila Mathews is a very pleasant 45 year old who had a lumbar microdiscectomy 2018 has had ongoing progressive low back and intermittent radicular left leg pain since. She states the quality-of-life has continued to deteriorate. She has failed appropriate. We have discussed risks and benefits off surgical intervention and the patient has elected to move forward with surgery.  Plan:   This would be an oblique lumbar interbody fusion with posterior supplemental pedicle screw fixation. I have gone over the surgical procedure with the patient and her husband in great detail and all of their questions were addressed.OLIF/XLIF risks, benefits of surgery were reviewed with the patient. These include: infection, bleeding, death, stroke, paralysis, ongoing or worse pain, need for additional surgery, injury to the lumbar plexus resulting in hip flexor weakness and difficulty walking without assistive devices. Adjacent segment degenerative disease, need for additional surgery  including fusing other levels, leak of spinal fluid, Nonunion, hardware failure, breakage, or mal-position. Deep venous thrombosis (DVT) requiring additional treatment such as filter, and/or medications. Injury to abdominal contents, loss in bowel and bladder control. Risks and benefits of spinal fusion: Infection, bleeding, death, stroke, paralysis, ongoing or worse pain, need for additional surgery, nonunion, leak of spinal fluid, adjacent segment degeneration requiring additional fusion surgery, Injury to abdominal vessels that can require anterior surgery to stop bleeding. Malposition of the cage and/or pedicle screws that could require additional surgery. Loss of bowel and bladder control. Postoperative hematoma causing neurologic compression that could require urgent or emergent re-operation.  We have received preoperative medical clearance from the primary care provider. Patient has been evaluated by vascular surgeon Dr. Chestine Spore. I reviewed the patient's medication list with her. She is not on any blood thinners. Not using any aspirin products. Not using any anti-inflammatory medications. Not taking any over-the-counter vitamins or supplements. I did advise her to avoid any anti-inflammatory medications leading up to surgery.  Patient was given an LSO brace at today's office visit.  We have also discussed the post-operative recovery period to include: bathing/showering restrictions, wound healing, activity (and driving) restrictions, medications/pain mangement.  We have also discussed post-operative redflags to include: signs and symptoms of postoperative infection, DVT/PE.  Patient was provided discharge insstructions at today's office visit.  Follow-up: 2 weeks postop

## 2020-09-18 NOTE — Pre-Procedure Instructions (Signed)
Surgical Instructions    Your procedure is scheduled on Thursday, March 24th.  Report to [facility] Main Entrance "A" at 5: A.M., then check in with the Admitting office.  Call this number if you have problems the morning of surgery:  [phone]   If you have any questions prior to your surgery date call [phone]: Open Monday-Friday 8am-4pm    Remember:  Do not eat after midnight the night before your surgery  You may drink clear liquids until 4:30 a.m. the morning of your surgery.   Clear liquids allowed are: Water, Non-Citrus Juices (without pulp), Carbonated Beverages, Clear Tea, Black Coffee Only, and Gatorade    Take these medicines the morning of surgery with A SIP OF WATER  lamoTRIgine (LAMICTAL)  levothyroxine (SYNTHROID)  sertraline (ZOLOFT) Plecanatide (TRULANCE)    Take these as needed the morning of surgery  clonazePAM (KLONOPIN) SUMAtriptan (IMITREX)   As of today, STOP taking any Aspirin (unless otherwise instructed by your surgeon) Aleve, Naproxen, Ibuprofen, Motrin, Advil, Goody's, BC's, all herbal medications, fish oil, and all vitamins.                     Do not wear jewelry, make up, or nail polish            Do not wear lotions, powders, perfumes, or deodorant.            Do not shave 48 hours prior to surgery.             Do not bring valuables to the hospital.            [facility] is not responsible for any belongings or valuables.  Do NOT Smoke (Tobacco/Vaping) or drink Alcohol 24 hours prior to your procedure If you use a CPAP at night, you may bring all equipment for your overnight stay.   Contacts, glasses, dentures or bridgework may not be worn into surgery, please bring cases for these belongings   For patients admitted to the hospital, discharge time will be determined by your treatment team.   Patients discharged the day of surgery will not be allowed to drive home, and someone needs to stay with them for 24 hours.    Special  instructions:   [facility]- Preparing For Surgery  Before surgery, you can play an important role. Because skin is not sterile, your skin needs to be as free of germs as possible. You can reduce the number of germs on your skin by washing with CHG (chlorahexidine gluconate) Soap before surgery.  CHG is an antiseptic cleaner which kills germs and bonds with the skin to continue killing germs even after washing.    Oral Hygiene is also important to reduce your risk of infection.  Remember - BRUSH YOUR TEETH THE MORNING OF SURGERY WITH YOUR REGULAR TOOTHPASTE  Please do not use if you have an allergy to CHG or antibacterial soaps. If your skin becomes reddened/irritated stop using the CHG.  Do not shave (including legs and underarms) for at least 48 hours prior to first CHG shower. It is OK to shave your face.  Please follow these instructions carefully.   1. Shower the NIGHT BEFORE SURGERY and the MORNING OF SURGERY  2. If you chose to wash your hair, wash your hair first as usual with your normal shampoo.  3. After you shampoo, rinse your hair and body thoroughly to remove the shampoo.  4. Use CHG Soap as you would any other liquid  soap. You can apply CHG directly to the skin and wash gently with a scrungie or a clean washcloth.   5. Apply the CHG Soap to your body ONLY FROM THE NECK DOWN.  Do not use on open wounds or open sores. Avoid contact with your eyes, ears, mouth and genitals (private parts). Wash Face and genitals (private parts)  with your normal soap.   6. Wash thoroughly, paying special attention to the area where your surgery will be performed.  7. Thoroughly rinse your body with warm water from the neck down.  8. DO NOT shower/wash with your normal soap after using and rinsing off the CHG Soap.  9. Pat yourself dry with a CLEAN TOWEL.  10. Wear CLEAN PAJAMAS to bed the night before surgery  11. Place CLEAN SHEETS on your bed the night before your surgery  12. DO NOT  SLEEP WITH PETS.   Day of Surgery: Shower with CHG soap. Wear Clean/Comfortable clothing the morning of surgery Do not apply any deodorants/lotions.   Remember to brush your teeth WITH YOUR REGULAR TOOTHPASTE.   Please read over the following fact sheets that you were given.

## 2020-09-19 ENCOUNTER — Ambulatory Visit: Payer: Self-pay | Admitting: Orthopedic Surgery

## 2020-09-19 ENCOUNTER — Encounter (HOSPITAL_COMMUNITY): Payer: Self-pay

## 2020-09-19 ENCOUNTER — Ambulatory Visit (HOSPITAL_COMMUNITY)
Admission: RE | Admit: 2020-09-19 | Discharge: 2020-09-19 | Disposition: A | Discharge status: Home / Self Care | Payer: Managed Care, Other (non HMO) | Source: Ambulatory Visit | Attending: Orthopedic Surgery | Admitting: Orthopedic Surgery

## 2020-09-19 ENCOUNTER — Encounter (HOSPITAL_COMMUNITY)
Admission: RE | Admit: 2020-09-19 | Discharge: 2020-09-19 | Disposition: A | Discharge status: Home / Self Care | Payer: Managed Care, Other (non HMO) | Source: Ambulatory Visit | Attending: Orthopedic Surgery | Admitting: Orthopedic Surgery

## 2020-09-19 ENCOUNTER — Other Ambulatory Visit: Payer: Self-pay

## 2020-09-19 DIAGNOSIS — Z20822 Contact with and (suspected) exposure to covid-19: Secondary | ICD-10-CM | POA: Insufficient documentation

## 2020-09-19 DIAGNOSIS — Z01818 Encounter for other preprocedural examination: Secondary | ICD-10-CM

## 2020-09-19 HISTORY — DX: Disorder of thyroid, unspecified: E07.9

## 2020-09-19 HISTORY — DX: Family history of other specified conditions: Z84.89

## 2020-09-19 HISTORY — DX: Personal history of diseases of the blood and blood-forming organs and certain disorders involving the immune mechanism: Z86.2

## 2020-09-19 LAB — TYPE AND SCREEN
ABO/RH(D): A POS
Antibody Screen: NEGATIVE

## 2020-09-19 LAB — BASIC METABOLIC PANEL
Anion gap: 6 (ref 5–15)
BUN: 8 mg/dL (ref 6–20)
CO2: 27 mmol/L (ref 22–32)
Calcium: 9.3 mg/dL (ref 8.9–10.3)
Chloride: 102 mmol/L (ref 98–111)
Creatinine, Ser: 0.75 mg/dL (ref 0.44–1.00)
GFR, Estimated: >60 mL/min (ref >60)
Glucose, Bld: 90 mg/dL (ref 70–99)
Potassium: 3.6 mmol/L (ref 3.5–5.1)
Sodium: 135 mmol/L (ref 135–145)

## 2020-09-19 LAB — URINALYSIS, ROUTINE W REFLEX MICROSCOPIC
Bilirubin Urine: NEGATIVE
Glucose, UA: NEGATIVE mg/dL
Hgb urine dipstick: NEGATIVE
Ketones, ur: NEGATIVE mg/dL
Leukocytes,Ua: NEGATIVE
Nitrite: NEGATIVE
Protein, ur: NEGATIVE mg/dL
Specific Gravity, Urine: 1.012 (ref 1.005–1.030)
pH: 7 (ref 5.0–8.0)

## 2020-09-19 LAB — PROTIME-INR
INR: 1 (ref 0.8–1.2)
Prothrombin Time: 13.1 seconds (ref 11.4–15.2)

## 2020-09-19 LAB — APTT: aPTT: 28 seconds (ref 24–36)

## 2020-09-19 LAB — CBC
HCT: 41 % (ref 36.0–46.0)
Hemoglobin: 12.8 g/dL (ref 12.0–15.0)
MCH: 28.8 pg (ref 26.0–34.0)
MCHC: 31.2 g/dL (ref 30.0–36.0)
MCV: 92.1 fL (ref 80.0–100.0)
Platelets: 410 10*3/uL — ABNORMAL HIGH (ref 150–400)
RBC: 4.45 MIL/uL (ref 3.87–5.11)
RDW: 14.6 % (ref 11.5–15.5)
WBC: 8.8 10*3/uL (ref 4.0–10.5)
nRBC: 0 % (ref 0.0–0.2)

## 2020-09-19 LAB — SURGICAL PCR SCREEN
MRSA, PCR: NEGATIVE
Staphylococcus aureus: NEGATIVE

## 2020-09-19 LAB — SARS CORONAVIRUS 2 (TAT 6-24 HRS): SARS Coronavirus 2: NEGATIVE

## 2020-09-19 NOTE — Progress Notes (Signed)
PCP: Dr. Claiborne Billings Cardiologist: denies  EKG: n/a CXR: today ECHO: n/a Stress Test: n/a Cardiac Cath: n/a  Covid tested today, aware to quarentine  Patient denies shortness of breath, fever, cough, and chest pain at PAT appointment.  Patient verbalized understanding of instructions provided today at the PAT appointment.  Patient asked to review instructions at home and day of surgery.   Chart forwarded to anesthesia per order

## 2020-09-21 ENCOUNTER — Encounter (HOSPITAL_COMMUNITY)
Admission: RE | Disposition: A | Discharge status: Home / Self Care | Payer: Self-pay | Source: Home / Self Care | Attending: Orthopedic Surgery

## 2020-09-21 ENCOUNTER — Inpatient Hospital Stay (HOSPITAL_COMMUNITY): Payer: Managed Care, Other (non HMO)

## 2020-09-21 ENCOUNTER — Inpatient Hospital Stay (HOSPITAL_COMMUNITY)
Admission: RE | Admit: 2020-09-21 | Discharge: 2020-09-22 | DRG: 460 | Disposition: A | Discharge status: Home / Self Care | Payer: Managed Care, Other (non HMO) | Attending: Orthopedic Surgery | Admitting: Orthopedic Surgery

## 2020-09-21 ENCOUNTER — Encounter (HOSPITAL_COMMUNITY): Payer: Self-pay | Admitting: Orthopedic Surgery

## 2020-09-21 ENCOUNTER — Inpatient Hospital Stay (HOSPITAL_COMMUNITY): Payer: Managed Care, Other (non HMO) | Admitting: Physician Assistant

## 2020-09-21 ENCOUNTER — Other Ambulatory Visit: Payer: Self-pay

## 2020-09-21 DIAGNOSIS — E079 Disorder of thyroid, unspecified: Secondary | ICD-10-CM | POA: Diagnosis present

## 2020-09-21 DIAGNOSIS — Z818 Family history of other mental and behavioral disorders: Secondary | ICD-10-CM | POA: Diagnosis not present

## 2020-09-21 DIAGNOSIS — Z803 Family history of malignant neoplasm of breast: Secondary | ICD-10-CM

## 2020-09-21 DIAGNOSIS — M5136 Other intervertebral disc degeneration, lumbar region: Secondary | ICD-10-CM

## 2020-09-21 DIAGNOSIS — Z981 Arthrodesis status: Secondary | ICD-10-CM

## 2020-09-21 DIAGNOSIS — K219 Gastro-esophageal reflux disease without esophagitis: Secondary | ICD-10-CM | POA: Diagnosis present

## 2020-09-21 DIAGNOSIS — Z833 Family history of diabetes mellitus: Secondary | ICD-10-CM

## 2020-09-21 DIAGNOSIS — Z20822 Contact with and (suspected) exposure to covid-19: Secondary | ICD-10-CM | POA: Diagnosis present

## 2020-09-21 DIAGNOSIS — M961 Postlaminectomy syndrome, not elsewhere classified: Principal | ICD-10-CM | POA: Diagnosis present

## 2020-09-21 DIAGNOSIS — Z79899 Other long term (current) drug therapy: Secondary | ICD-10-CM | POA: Diagnosis not present

## 2020-09-21 DIAGNOSIS — Z91018 Allergy to other foods: Secondary | ICD-10-CM | POA: Diagnosis not present

## 2020-09-21 DIAGNOSIS — Z79890 Hormone replacement therapy (postmenopausal): Secondary | ICD-10-CM | POA: Diagnosis not present

## 2020-09-21 DIAGNOSIS — Z9889 Other specified postprocedural states: Secondary | ICD-10-CM | POA: Diagnosis not present

## 2020-09-21 DIAGNOSIS — Z813 Family history of other psychoactive substance abuse and dependence: Secondary | ICD-10-CM | POA: Diagnosis not present

## 2020-09-21 DIAGNOSIS — Z419 Encounter for procedure for purposes other than remedying health state, unspecified: Secondary | ICD-10-CM

## 2020-09-21 DIAGNOSIS — Z885 Allergy status to narcotic agent status: Secondary | ICD-10-CM

## 2020-09-21 DIAGNOSIS — M5116 Intervertebral disc disorders with radiculopathy, lumbar region: Secondary | ICD-10-CM | POA: Diagnosis present

## 2020-09-21 HISTORY — PX: ANTERIOR LATERAL LUMBAR FUSION WITH PERCUTANEOUS SCREW 1 LEVEL: SHX5553

## 2020-09-21 HISTORY — PX: ABDOMINAL EXPOSURE: SHX5708

## 2020-09-21 LAB — CBC
HCT: 35.8 % — ABNORMAL LOW (ref 36.0–46.0)
Hemoglobin: 11.3 g/dL — ABNORMAL LOW (ref 12.0–15.0)
MCH: 29.1 pg (ref 26.0–34.0)
MCHC: 31.6 g/dL (ref 30.0–36.0)
MCV: 92.3 fL (ref 80.0–100.0)
Platelets: 347 10*3/uL (ref 150–400)
RBC: 3.88 MIL/uL (ref 3.87–5.11)
RDW: 14.6 % (ref 11.5–15.5)
WBC: 16.2 10*3/uL — ABNORMAL HIGH (ref 4.0–10.5)
nRBC: 0 % (ref 0.0–0.2)

## 2020-09-21 LAB — CREATININE, SERUM
Creatinine, Ser: 0.9 mg/dL (ref 0.44–1.00)
GFR, Estimated: >60 mL/min (ref >60)

## 2020-09-21 LAB — POCT PREGNANCY, URINE: Preg Test, Ur: NEGATIVE

## 2020-09-21 LAB — ABO/RH: ABO/RH(D): A POS

## 2020-09-21 SURGERY — ANTERIOR LATERAL LUMBAR FUSION WITH PERCUTANEOUS SCREW 1 LEVEL
Anesthesia: General | Site: Spine Lumbar

## 2020-09-21 MED ORDER — GLYCOPYRROLATE PF 0.2 MG/ML IJ SOSY
PREFILLED_SYRINGE | INTRAMUSCULAR | Status: DC | PRN
  Administered 2020-09-21: .2 mg via INTRAVENOUS

## 2020-09-21 MED ORDER — ROCURONIUM BROMIDE 10 MG/ML (PF) SYRINGE
PREFILLED_SYRINGE | INTRAVENOUS | Status: AC
  Filled 2020-09-21: qty 10

## 2020-09-21 MED ORDER — CHLORHEXIDINE GLUCONATE 0.12 % MT SOLN
15.0000 mL | Freq: Once | OROMUCOSAL | Status: AC
  Administered 2020-09-21: 15 mL via OROMUCOSAL
  Filled 2020-09-21: qty 15

## 2020-09-21 MED ORDER — ONDANSETRON HCL 4 MG/2ML IJ SOLN: 4.0000 mg | Freq: Four times a day (QID) | INTRAMUSCULAR | Status: DC | PRN

## 2020-09-21 MED ORDER — BUPIVACAINE-EPINEPHRINE 0.25% -1:200000 IJ SOLN
INTRAMUSCULAR | Status: DC | PRN
  Administered 2020-09-21: 20 mL

## 2020-09-21 MED ORDER — CLONAZEPAM 0.5 MG PO TABS: 0.5000 mg | ORAL_TABLET | Freq: Three times a day (TID) | ORAL | Status: DC | PRN

## 2020-09-21 MED ORDER — PROPOFOL 10 MG/ML IV BOLUS
INTRAVENOUS | Status: DC | PRN
  Administered 2020-09-21: 170 mg via INTRAVENOUS

## 2020-09-21 MED ORDER — SODIUM CHLORIDE 0.9 % IV SOLN: 250.0000 mL | INTRAVENOUS | Status: DC

## 2020-09-21 MED ORDER — MIDAZOLAM HCL 2 MG/2ML IJ SOLN
INTRAMUSCULAR | Status: AC
  Filled 2020-09-21: qty 2

## 2020-09-21 MED ORDER — LACTATED RINGERS IV SOLN: INTRAVENOUS | Status: DC

## 2020-09-21 MED ORDER — FENTANYL CITRATE (PF) 100 MCG/2ML IJ SOLN
25.0000 ug | INTRAMUSCULAR | Status: DC | PRN
  Administered 2020-09-21 (×2): 50 ug via INTRAVENOUS

## 2020-09-21 MED ORDER — BUPIVACAINE HCL (PF) 0.25 % IJ SOLN
INTRAMUSCULAR | Status: AC
  Filled 2020-09-21: qty 30

## 2020-09-21 MED ORDER — HYDROMORPHONE HCL 1 MG/ML IJ SOLN
0.5000 mg | INTRAMUSCULAR | Status: DC | PRN
  Administered 2020-09-21: 0.5 mg via INTRAVENOUS
  Filled 2020-09-21: qty 0.5

## 2020-09-21 MED ORDER — SODIUM CHLORIDE 0.9% FLUSH
3.0000 mL | Freq: Two times a day (BID) | INTRAVENOUS | Status: DC
  Administered 2020-09-21: 3 mL via INTRAVENOUS

## 2020-09-21 MED ORDER — CHLORHEXIDINE GLUCONATE 4 % EX LIQD: 60.0000 mL | Freq: Once | CUTANEOUS | Status: DC

## 2020-09-21 MED ORDER — CEFAZOLIN SODIUM-DEXTROSE 2-4 GM/100ML-% IV SOLN
2.0000 g | INTRAVENOUS | Status: AC
  Administered 2020-09-21: 2 g via INTRAVENOUS
  Filled 2020-09-21: qty 100

## 2020-09-21 MED ORDER — ACETAMINOPHEN 10 MG/ML IV SOLN
INTRAVENOUS | Status: DC | PRN
  Administered 2020-09-21: 1000 mg via INTRAVENOUS

## 2020-09-21 MED ORDER — OXYCODONE-ACETAMINOPHEN 10-325 MG PO TABS: 1.0000 | ORAL_TABLET | Freq: Four times a day (QID) | ORAL | 0 refills | Status: AC | PRN

## 2020-09-21 MED ORDER — FENTANYL CITRATE (PF) 100 MCG/2ML IJ SOLN
INTRAMUSCULAR | Status: AC
  Filled 2020-09-21: qty 2

## 2020-09-21 MED ORDER — ENOXAPARIN SODIUM 40 MG/0.4ML ~~LOC~~ SOLN
40.0000 mg | SUBCUTANEOUS | Status: DC
  Administered 2020-09-22: 40 mg via SUBCUTANEOUS
  Filled 2020-09-21: qty 0.4

## 2020-09-21 MED ORDER — PROPOFOL 10 MG/ML IV BOLUS
INTRAVENOUS | Status: AC
  Filled 2020-09-21: qty 40

## 2020-09-21 MED ORDER — SODIUM CHLORIDE 0.9% FLUSH: 3.0000 mL | INTRAVENOUS | Status: DC | PRN

## 2020-09-21 MED ORDER — METHOCARBAMOL 1000 MG/10ML IJ SOLN
500.0000 mg | Freq: Four times a day (QID) | INTRAVENOUS | Status: DC | PRN
  Filled 2020-09-21: qty 5

## 2020-09-21 MED ORDER — ROCURONIUM BROMIDE 10 MG/ML (PF) SYRINGE
PREFILLED_SYRINGE | INTRAVENOUS | Status: DC | PRN
  Administered 2020-09-21: 20 mg via INTRAVENOUS
  Administered 2020-09-21: 80 mg via INTRAVENOUS
  Administered 2020-09-21 (×2): 20 mg via INTRAVENOUS

## 2020-09-21 MED ORDER — ONDANSETRON HCL 4 MG PO TABS: 4.0000 mg | ORAL_TABLET | Freq: Four times a day (QID) | ORAL | Status: DC | PRN

## 2020-09-21 MED ORDER — ONDANSETRON HCL 4 MG PO TABS: 4.0000 mg | ORAL_TABLET | Freq: Three times a day (TID) | ORAL | 0 refills | Status: DC | PRN

## 2020-09-21 MED ORDER — OXYCODONE HCL 5 MG PO TABS: 5.0000 mg | ORAL_TABLET | ORAL | Status: DC | PRN

## 2020-09-21 MED ORDER — LEVOTHYROXINE SODIUM 25 MCG PO TABS
25.0000 ug | ORAL_TABLET | Freq: Every day | ORAL | Status: DC
  Administered 2020-09-22: 25 ug via ORAL
  Filled 2020-09-21: qty 1

## 2020-09-21 MED ORDER — GABAPENTIN 300 MG PO CAPS
300.0000 mg | ORAL_CAPSULE | Freq: Three times a day (TID) | ORAL | Status: DC
  Administered 2020-09-21 – 2020-09-22 (×3): 300 mg via ORAL
  Filled 2020-09-21 (×3): qty 1

## 2020-09-21 MED ORDER — OXYCODONE HCL 5 MG PO TABS
10.0000 mg | ORAL_TABLET | ORAL | Status: DC | PRN
  Administered 2020-09-21 – 2020-09-22 (×6): 10 mg via ORAL
  Filled 2020-09-21 (×6): qty 2

## 2020-09-21 MED ORDER — PROPOFOL 1000 MG/100ML IV EMUL
INTRAVENOUS | Status: AC
  Filled 2020-09-21: qty 100

## 2020-09-21 MED ORDER — SCOPOLAMINE 1 MG/3DAYS TD PT72
MEDICATED_PATCH | TRANSDERMAL | Status: DC | PRN
  Administered 2020-09-21: 1 via TRANSDERMAL

## 2020-09-21 MED ORDER — THROMBIN 20000 UNITS EX SOLR
CUTANEOUS | Status: DC | PRN
  Administered 2020-09-21: 20000 [IU] via TOPICAL

## 2020-09-21 MED ORDER — METHOCARBAMOL 500 MG PO TABS
500.0000 mg | ORAL_TABLET | Freq: Three times a day (TID) | ORAL | 0 refills | Status: AC | PRN
Start: 2020-09-21 — End: 2020-09-26

## 2020-09-21 MED ORDER — NEOSTIGMINE METHYLSULFATE 3 MG/3ML IV SOSY
PREFILLED_SYRINGE | INTRAVENOUS | Status: DC | PRN
  Administered 2020-09-21: 2 mg via INTRAVENOUS

## 2020-09-21 MED ORDER — ACETAMINOPHEN 650 MG RE SUPP: 650.0000 mg | RECTAL | Status: DC | PRN

## 2020-09-21 MED ORDER — SERTRALINE HCL 50 MG PO TABS
150.0000 mg | ORAL_TABLET | Freq: Every day | ORAL | Status: DC
  Administered 2020-09-21: 150 mg via ORAL
  Filled 2020-09-21 (×2): qty 3

## 2020-09-21 MED ORDER — POLYETHYLENE GLYCOL 3350 17 G PO PACK
17.0000 g | PACK | Freq: Every day | ORAL | Status: DC | PRN
  Administered 2020-09-21: 17 g via ORAL
  Filled 2020-09-21: qty 1

## 2020-09-21 MED ORDER — SUCCINYLCHOLINE CHLORIDE 200 MG/10ML IV SOSY
PREFILLED_SYRINGE | INTRAVENOUS | Status: AC
  Filled 2020-09-21: qty 10

## 2020-09-21 MED ORDER — METHOCARBAMOL 500 MG PO TABS
500.0000 mg | ORAL_TABLET | Freq: Four times a day (QID) | ORAL | Status: DC | PRN
  Administered 2020-09-21 – 2020-09-22 (×2): 500 mg via ORAL
  Filled 2020-09-21 (×2): qty 1

## 2020-09-21 MED ORDER — LIDOCAINE 2% (20 MG/ML) 5 ML SYRINGE
INTRAMUSCULAR | Status: DC | PRN
  Administered 2020-09-21: 100 mg via INTRAVENOUS

## 2020-09-21 MED ORDER — THROMBIN 20000 UNITS EX SOLR
CUTANEOUS | Status: AC
  Filled 2020-09-21: qty 20000

## 2020-09-21 MED ORDER — ACETAMINOPHEN 10 MG/ML IV SOLN
INTRAVENOUS | Status: AC
  Filled 2020-09-21: qty 100

## 2020-09-21 MED ORDER — DEXAMETHASONE SODIUM PHOSPHATE 10 MG/ML IJ SOLN
INTRAMUSCULAR | Status: DC | PRN
  Administered 2020-09-21: 10 mg via INTRAVENOUS

## 2020-09-21 MED ORDER — LAMOTRIGINE 100 MG PO TABS
200.0000 mg | ORAL_TABLET | Freq: Every day | ORAL | Status: DC
  Administered 2020-09-21: 200 mg via ORAL
  Filled 2020-09-21 (×2): qty 2

## 2020-09-21 MED ORDER — ACETAMINOPHEN 325 MG PO TABS: 650.0000 mg | ORAL_TABLET | ORAL | Status: DC | PRN

## 2020-09-21 MED ORDER — 0.9 % SODIUM CHLORIDE (POUR BTL) OPTIME
TOPICAL | Status: DC | PRN
  Administered 2020-09-21: 1000 mL

## 2020-09-21 MED ORDER — ONDANSETRON HCL 4 MG/2ML IJ SOLN
INTRAMUSCULAR | Status: DC | PRN
  Administered 2020-09-21: 4 mg via INTRAVENOUS

## 2020-09-21 MED ORDER — EPINEPHRINE PF 1 MG/ML IJ SOLN
INTRAMUSCULAR | Status: AC
  Filled 2020-09-21: qty 1

## 2020-09-21 MED ORDER — DEXAMETHASONE 4 MG PO TABS
4.0000 mg | ORAL_TABLET | Freq: Four times a day (QID) | ORAL | Status: DC
  Administered 2020-09-21 – 2020-09-22 (×4): 4 mg via ORAL
  Filled 2020-09-21 (×4): qty 1

## 2020-09-21 MED ORDER — MIDAZOLAM HCL 5 MG/5ML IJ SOLN
INTRAMUSCULAR | Status: DC | PRN
  Administered 2020-09-21: 4 mg via INTRAVENOUS

## 2020-09-21 MED ORDER — ORAL CARE MOUTH RINSE: 15.0000 mL | Freq: Once | OROMUCOSAL | Status: AC

## 2020-09-21 MED ORDER — ONDANSETRON HCL 4 MG/2ML IJ SOLN: 4.0000 mg | Freq: Once | INTRAMUSCULAR | Status: DC | PRN

## 2020-09-21 MED ORDER — SUFENTANIL CITRATE 50 MCG/ML IV SOLN
0.2500 ug/kg/h | INTRAVENOUS | Status: AC
  Administered 2020-09-21: .125 ug/kg/h via INTRAVENOUS
  Filled 2020-09-21: qty 1

## 2020-09-21 MED ORDER — DOCUSATE SODIUM 100 MG PO CAPS
100.0000 mg | ORAL_CAPSULE | Freq: Two times a day (BID) | ORAL | Status: DC
  Administered 2020-09-21 – 2020-09-22 (×3): 100 mg via ORAL
  Filled 2020-09-21 (×2): qty 1

## 2020-09-21 MED ORDER — PROPOFOL 500 MG/50ML IV EMUL
INTRAVENOUS | Status: DC | PRN
  Administered 2020-09-21: 150 ug/kg/min via INTRAVENOUS

## 2020-09-21 MED ORDER — FLEET ENEMA 7-19 GM/118ML RE ENEM: 1.0000 | ENEMA | Freq: Once | RECTAL | Status: DC | PRN

## 2020-09-21 MED ORDER — FENTANYL CITRATE (PF) 250 MCG/5ML IJ SOLN
INTRAMUSCULAR | Status: AC
  Filled 2020-09-21: qty 5

## 2020-09-21 MED ORDER — CEFAZOLIN SODIUM-DEXTROSE 1-4 GM/50ML-% IV SOLN
1.0000 g | Freq: Three times a day (TID) | INTRAVENOUS | Status: AC
  Administered 2020-09-21 (×2): 1 g via INTRAVENOUS
  Filled 2020-09-21 (×2): qty 50

## 2020-09-21 MED ORDER — FENTANYL CITRATE (PF) 100 MCG/2ML IJ SOLN
INTRAMUSCULAR | Status: DC | PRN
  Administered 2020-09-21: 50 ug via INTRAVENOUS
  Administered 2020-09-21 (×2): 100 ug via INTRAVENOUS

## 2020-09-21 MED ORDER — METHOCARBAMOL 500 MG PO TABS
ORAL_TABLET | ORAL | Status: AC
  Administered 2020-09-21: 500 mg
  Filled 2020-09-21: qty 1

## 2020-09-21 MED ORDER — LIDOCAINE 2% (20 MG/ML) 5 ML SYRINGE
INTRAMUSCULAR | Status: AC
  Filled 2020-09-21: qty 5

## 2020-09-21 MED ORDER — SUFENTANIL CITRATE 50 MCG/ML IV SOLN
0.2500 ug/kg/h | INTRAVENOUS | Status: DC
  Filled 2020-09-21: qty 1

## 2020-09-21 MED ORDER — DEXAMETHASONE SODIUM PHOSPHATE 4 MG/ML IJ SOLN: 4.0000 mg | Freq: Four times a day (QID) | INTRAMUSCULAR | Status: DC

## 2020-09-21 MED ORDER — ENOXAPARIN SODIUM 40 MG/0.4ML ~~LOC~~ SOLN: 40.0000 mg | SUBCUTANEOUS | 0 refills | Status: DC

## 2020-09-21 MED ORDER — LITHIUM CARBONATE ER 450 MG PO TBCR
900.0000 mg | EXTENDED_RELEASE_TABLET | Freq: Every day | ORAL | Status: DC
  Administered 2020-09-21: 900 mg via ORAL
  Filled 2020-09-21 (×2): qty 2

## 2020-09-21 MED ORDER — HEMOSTATIC AGENTS (NO CHARGE) OPTIME
TOPICAL | Status: DC | PRN
  Administered 2020-09-21 (×3): 1 via TOPICAL

## 2020-09-21 SURGICAL SUPPLY — 115 items
APPLIER CLIP 11 MED OPEN (CLIP) ×3
BLADE CLIPPER SURG (BLADE) IMPLANT
BLADE SURG 10 STRL SS (BLADE) ×3 IMPLANT
BONE MATRIX OSTEOCEL PRO MED (Bone Implant) ×3 IMPLANT
CLIP APPLIE 11 MED OPEN (CLIP) ×2 IMPLANT
CLIP LIGATING EXTRA MED SLVR (CLIP) IMPLANT
CLIP LIGATING EXTRA SM BLUE (MISCELLANEOUS) IMPLANT
CLIP NEUROVISION LG (CLIP) ×3 IMPLANT
CLSR STERI-STRIP ANTIMIC 1/2X4 (GAUZE/BANDAGES/DRESSINGS) ×3 IMPLANT
CORD BIPOLAR FORCEPS 12FT (ELECTRODE) ×3 IMPLANT
COVER SURGICAL LIGHT HANDLE (MISCELLANEOUS) ×3 IMPLANT
COVER WAND RF STERILE (DRAPES) ×6 IMPLANT
DERMABOND ADVANCED (GAUZE/BANDAGES/DRESSINGS)
DERMABOND ADVANCED .7 DNX12 (GAUZE/BANDAGES/DRESSINGS) IMPLANT
DISSECTOR BLUNT TIP ENDO 5MM (MISCELLANEOUS) ×6 IMPLANT
DRAPE C-ARM 42X72 X-RAY (DRAPES) ×6 IMPLANT
DRAPE C-ARMOR (DRAPES) ×6 IMPLANT
DRAPE ORTHO SPLIT 77X108 STRL (DRAPES) ×3
DRAPE POUCH INSTRU U-SHP 10X18 (DRAPES) ×3 IMPLANT
DRAPE SURG ORHT 6 SPLT 77X108 (DRAPES) ×2 IMPLANT
DRAPE U-SHAPE 47X51 STRL (DRAPES) ×6 IMPLANT
DRSG AQUACEL AG ADV 3.5X 6 (GAUZE/BANDAGES/DRESSINGS) IMPLANT
DRSG OPSITE POSTOP 4X6 (GAUZE/BANDAGES/DRESSINGS) ×6 IMPLANT
DRSG OPSITE POSTOP 4X8 (GAUZE/BANDAGES/DRESSINGS) ×3 IMPLANT
DURAPREP 26ML APPLICATOR (WOUND CARE) ×3 IMPLANT
ELECT BLADE 4.0 EZ CLEAN MEGAD (MISCELLANEOUS) ×6
ELECT CAUTERY BLADE 6.4 (BLADE) ×3 IMPLANT
ELECT PENCIL ROCKER SW 15FT (MISCELLANEOUS) ×3 IMPLANT
ELECT REM PT RETURN 9FT ADLT (ELECTROSURGICAL) ×3
ELECTRODE BLDE 4.0 EZ CLN MEGD (MISCELLANEOUS) ×4 IMPLANT
ELECTRODE REM PT RTRN 9FT ADLT (ELECTROSURGICAL) ×2 IMPLANT
GAUZE 4X4 16PLY RFD (DISPOSABLE) ×3 IMPLANT
GLOVE BIO SURGEON STRL SZ 6.5 (GLOVE) ×3 IMPLANT
GLOVE BIO SURGEON STRL SZ7.5 (GLOVE) ×3 IMPLANT
GLOVE BIOGEL PI IND STRL 8.5 (GLOVE) ×2 IMPLANT
GLOVE BIOGEL PI INDICATOR 8.5 (GLOVE) ×1
GLOVE SRG 8 PF TXTR STRL LF DI (GLOVE) ×2 IMPLANT
GLOVE SS BIOGEL STRL SZ 7.5 (GLOVE) ×2 IMPLANT
GLOVE SS BIOGEL STRL SZ 8.5 (GLOVE) ×2 IMPLANT
GLOVE SUPERSENSE BIOGEL SZ 7.5 (GLOVE) ×1
GLOVE SUPERSENSE BIOGEL SZ 8.5 (GLOVE) ×1
GLOVE SURG UNDER POLY LF SZ6.5 (GLOVE) ×3 IMPLANT
GLOVE SURG UNDER POLY LF SZ8 (GLOVE) ×3
GOWN STRL REUS W/ TWL LRG LVL3 (GOWN DISPOSABLE) ×2 IMPLANT
GOWN STRL REUS W/ TWL XL LVL3 (GOWN DISPOSABLE) ×2 IMPLANT
GOWN STRL REUS W/TWL 2XL LVL3 (GOWN DISPOSABLE) ×6 IMPLANT
GOWN STRL REUS W/TWL LRG LVL3 (GOWN DISPOSABLE) ×3
GOWN STRL REUS W/TWL XL LVL3 (GOWN DISPOSABLE) ×3
GUIDEWIRE NITINOL BEVEL TIP (WIRE) ×12 IMPLANT
HEMOSTAT SNOW SURGICEL 2X4 (HEMOSTASIS) IMPLANT
HEMOSTAT SURGICEL 2X14 (HEMOSTASIS) ×3 IMPLANT
INSERT FOGARTY 61MM (MISCELLANEOUS) IMPLANT
INSERT FOGARTY SM (MISCELLANEOUS) IMPLANT
KIT BASIN OR (CUSTOM PROCEDURE TRAY) ×3 IMPLANT
KIT TURNOVER KIT B (KITS) ×3 IMPLANT
LOOP VESSEL MAXI BLUE (MISCELLANEOUS) IMPLANT
LOOP VESSEL MINI RED (MISCELLANEOUS) IMPLANT
MODULE EMG NEEDLE SSEP NVM5 (NEEDLE) ×3 IMPLANT
MODULE NVM5 NEXT GEN EMG (NEEDLE) ×3 IMPLANT
NEEDLE 22X1 1/2 (OR ONLY) (NEEDLE) ×3 IMPLANT
NEEDLE I-PASS III (NEEDLE) ×3 IMPLANT
NEEDLE SPNL 18GX3.5 QUINCKE PK (NEEDLE) ×3 IMPLANT
NS IRRIG 1000ML POUR BTL (IV SOLUTION) ×3 IMPLANT
PACK LAMINECTOMY ORTHO (CUSTOM PROCEDURE TRAY) ×3 IMPLANT
PACK UNIVERSAL I (CUSTOM PROCEDURE TRAY) ×3 IMPLANT
PAD ARMBOARD 7.5X6 YLW CONV (MISCELLANEOUS) ×12 IMPLANT
PLATE OLIF 25 2H SM (Plate) ×3 IMPLANT
PROBE BALL TIP NVM5 SNG USE (BALLOONS) ×3 IMPLANT
PUTTY DBX 2.5CC (Putty) ×3 IMPLANT
PUTTY DBX 2.5CC DEPUY (Putty) ×2 IMPLANT
ROD RELINE MAS LORD 5.5X45MM (Rod) ×3 IMPLANT
ROD RELINE MAS TI LORD 5.5X40 (Rod) ×3 IMPLANT
SCREW LOCK RELINE 5.5 TULIP (Screw) ×12 IMPLANT
SCREW RELINE MAS POLY 6.5X40MM (Screw) ×12 IMPLANT
SLEEVE SURGEON STRL (DRAPES) ×3 IMPLANT
SPACER OLIF 12X50 6D (Spacer) ×3 IMPLANT
SPONGE INTESTINAL PEANUT (DISPOSABLE) ×9 IMPLANT
SPONGE LAP 18X18 RF (DISPOSABLE) ×3 IMPLANT
SPONGE LAP 4X18 RFD (DISPOSABLE) ×3 IMPLANT
SPONGE SURGIFOAM ABS GEL 100 (HEMOSTASIS) ×3 IMPLANT
STAPLER VISISTAT 35W (STAPLE) ×3 IMPLANT
STRIP CLOSURE SKIN 1/2X4 (GAUZE/BANDAGES/DRESSINGS) IMPLANT
SURGIFLO W/THROMBIN 8M KIT (HEMOSTASIS) ×6 IMPLANT
SUT BONE WAX W31G (SUTURE) ×3 IMPLANT
SUT MNCRL AB 3-0 PS2 27 (SUTURE) ×9 IMPLANT
SUT PDS AB 1 CTX 36 (SUTURE) IMPLANT
SUT PROLENE 4 0 RB 1 (SUTURE)
SUT PROLENE 4-0 RB1 .5 CRCL 36 (SUTURE) IMPLANT
SUT PROLENE 5 0 C 1 24 (SUTURE) ×6 IMPLANT
SUT PROLENE 5 0 CC1 (SUTURE) IMPLANT
SUT PROLENE 6 0 C 1 30 (SUTURE) IMPLANT
SUT PROLENE 6 0 CC (SUTURE) IMPLANT
SUT SILK 0 TIES 10X30 (SUTURE) ×3 IMPLANT
SUT SILK 2 0 TIES 10X30 (SUTURE) ×6 IMPLANT
SUT SILK 2 0SH CR/8 30 (SUTURE) IMPLANT
SUT SILK 3 0 TIES 10X30 (SUTURE) ×3 IMPLANT
SUT SILK 3 0 TIES 17X18 (SUTURE) ×3
SUT SILK 3 0SH CR/8 30 (SUTURE) IMPLANT
SUT SILK 3-0 18XBRD TIE BLK (SUTURE) ×2 IMPLANT
SUT VIC AB 1 CT1 18XCR BRD 8 (SUTURE) ×4 IMPLANT
SUT VIC AB 1 CT1 27 (SUTURE) ×6
SUT VIC AB 1 CT1 27XBRD ANBCTR (SUTURE) ×4 IMPLANT
SUT VIC AB 1 CT1 8-18 (SUTURE) ×6
SUT VIC AB 1 CTX 36 (SUTURE) ×6
SUT VIC AB 1 CTX36XBRD ANBCTR (SUTURE) ×4 IMPLANT
SUT VIC AB 2-0 CT1 18 (SUTURE) ×9 IMPLANT
SYR BULB IRRIG 60ML STRL (SYRINGE) ×3 IMPLANT
SYR CONTROL 10ML LL (SYRINGE) ×3 IMPLANT
TAPE CLOTH 4X10 WHT NS (GAUZE/BANDAGES/DRESSINGS) ×6 IMPLANT
TOWEL GREEN STERILE (TOWEL DISPOSABLE) ×6 IMPLANT
TOWEL GREEN STERILE FF (TOWEL DISPOSABLE) ×3 IMPLANT
TRAY FOLEY W/BAG SLVR 16FR (SET/KITS/TRAYS/PACK) ×3
TRAY FOLEY W/BAG SLVR 16FR ST (SET/KITS/TRAYS/PACK) ×2 IMPLANT
TUBE SUCT ARGYLE STRL (TUBING) ×6 IMPLANT
WATER STERILE IRR 1000ML POUR (IV SOLUTION) IMPLANT

## 2020-09-21 NOTE — OR Nursing (Signed)
No retained objects seen in post-op Xray taken via OLIF protocol per Dr. Chilton Si, Radiologist

## 2020-09-21 NOTE — Brief Op Note (Signed)
09/21/2020  12:31 PM  PATIENT:  [name]  45 y.o. 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  PROCEDURE:  Procedure(s): OBLIQUE LUMBAR INTERBODY FUSION LUMBAR FOUR THROUGH FIVE, POSTERIOR SPINAL FUSION INSTRUMENTATION LUMAR FOUR THROUGH FIVE (N/A) LATERAL EXPOSURE FOR OBLIQUE LATERAL SPINE SURGERY (N/A)  SURGEON:  Surgeon(s) and Role: Panel 1:    Venita Lick, MD - Primary Panel 2:    Cephus Shelling, MD - Approach Surgeon  PHYSICIAN ASSISTANT:  Glynis Smiles, PA  ASSISTANTS: Marchelle Folks Ward, PA   ANESTHESIA:   general  EBL:  175 mL   BLOOD ADMINISTERED:none  DRAINS: none   LOCAL MEDICATIONS USED:  MARCAINE     SPECIMEN:  No Specimen  DISPOSITION OF SPECIMEN:  N/A  COUNTS:  YES  TOURNIQUET:  * No tourniquets in log *  DICTATION: .Dragon Dictation  PLAN OF CARE: Admit to inpatient   PATIENT DISPOSITION:  PACU - hemodynamically stable.

## 2020-09-21 NOTE — Transfer of Care (Signed)
Immediate Anesthesia Transfer of Care Note  Patient: Sheila Mathews  Procedure(s) Performed: OBLIQUE LUMBAR INTERBODY FUSION LUMBAR FOUR THROUGH FIVE, POSTERIOR SPINAL FUSION INSTRUMENTATION LUMAR FOUR THROUGH FIVE (N/A Spine Lumbar) LATERAL EXPOSURE FOR OBLIQUE LATERAL SPINE SURGERY (N/A Abdomen)  Patient Location: PACU  Anesthesia Type:General  Level of Consciousness: awake and drowsy  Airway & Oxygen Therapy: Patient Spontanous Breathing and Patient connected to face mask oxygen  Post-op Assessment: Report given to RN and Post -op Vital signs reviewed and stable  Post vital signs: Reviewed and stable  Last Vitals:  Vitals Value Taken Time  BP 119/70 09/21/20 1301  Temp 37.1 C 09/21/20 1300  Pulse 86 09/21/20 1304  Resp 20 09/21/20 1304  SpO2 97 % 09/21/20 1304  Vitals shown include unvalidated device data.  Last Pain:  Vitals:   09/21/20 0616  TempSrc:   PainSc: 5       Patients Stated Pain Goal: 3 (09/21/20 9450)  Complications: No complications documented.

## 2020-09-21 NOTE — Discharge Instructions (Signed)

## 2020-09-21 NOTE — H&P (Signed)
Addendum H&P: Patient presents today for definitive management of degenerative lumbar disc disease with radicular leg pain. Patient has had a prior lumbar discectomy at L4-5 and unfortunately has ongoing degenerative disc disease as well as as result of the failure to improve with conservative management we have elected to move forward with an L4-5 fusion.  All appropriate risks, benefits, alternatives to surgery were discussed with the patient and consent was obtained.  There has been no change in her clinical exam since her last office visit of 09/15/2020.  Patient has expressed a willingness and desire to move forward with surgery.

## 2020-09-21 NOTE — Evaluation (Signed)
Physical Therapy Evaluation Patient Details Name: Sheila Mathews MRN: [id number] DOB: 21-Oct-1975 Today's Date: 09/21/2020   History of Present Illness  45 y/o female s/p anterior and posterior L4-5 spinal fusion on 3/24. PMH: L5-S1 microlumbar decomp in 2018, bipolar disorder, depression, GERD  Clinical Impression  Patient admitted following surgery listed above. Patient presents with generalized weakness (L>R), impaired balance, decreased activity tolerance. Educated patient on back precautions with patient able to recall 3/3 at conclusion of session. Patient able to recall log roll technique from previous back surgery and perform adequately with no assist needed. Patient initially ambulated with no AD, however reaching for objects and x 2 instances of LOB. Patient ambulated 300' with RW and min guard, improved gait quality, speed, and stability noted. Patient will benefit from skilled PT services during acute stay to address listed deficits. Patient states MD will initiate PT after 6 weeks of recovery.     Follow Up Recommendations Follow surgeon's recommendation for DC plan and follow-up therapies    Equipment Recommendations  Rolling Kim Lauver with 5" wheels    Recommendations for Other Services       Precautions / Restrictions Precautions Precautions: Fall;Back Precaution Booklet Issued: Yes (comment) Required Braces or Orthoses: Spinal Brace Spinal Brace: Lumbar corset;Applied in sitting position Restrictions Weight Bearing Restrictions: No      Mobility  Bed Mobility Overal bed mobility: Needs Assistance Bed Mobility: Rolling;Sidelying to Sit;Sit to Sidelying Rolling: Supervision Sidelying to sit: Supervision     Sit to sidelying: Supervision General bed mobility comments: supervision for safety, patient able to verbalize log roll technique and perform adequately with no assistance    Transfers Overall transfer level: Needs assistance Equipment used: None Transfers:  Sit to/from Stand Sit to Stand: Min guard         General transfer comment: min guard for safety, no physical assistance required  Ambulation/Gait Ambulation/Gait assistance: Min guard Gait Distance (Feet): 300 Feet Assistive device: None;Rolling Andray Assefa (2 wheeled) Gait Pattern/deviations: Step-through pattern;Decreased stride length;Wide base of support;Antalgic Gait velocity: decreased   General Gait Details: Initially with no AD, patient unsteady with x 2 LOB in room and reaching for objects to steady self. Provided RW. Improved gait quality, speed, and stability. Overall slow gait speed but steady pace with RW  Stairs Stairs:  (deferred due to pain)          Wheelchair Mobility    Modified Rankin (Stroke Patients Only)       Balance Overall balance assessment: Mild deficits observed, not formally tested                                           Pertinent Vitals/Pain Pain Assessment: Faces Faces Pain Scale: Hurts little more Pain Location: back Pain Descriptors / Indicators: Grimacing;Guarding Pain Intervention(s): Limited activity within patient's tolerance;Monitored during session    Home Living Family/patient expects to be discharged to:: Private residence Living Arrangements: Spouse/significant other;Children Available Help at Discharge: Family;Available 24 hours/day Type of Home: House Home Access: Stairs to enter Entrance Stairs-Rails: None Entrance Stairs-Number of Steps: 2 Home Layout: One level (1 step down into living room) Home Equipment: None      Prior Function Level of Independence: Independent         Comments: used SPC occasionally     Hand Dominance        Extremity/Trunk Assessment   Upper Extremity Assessment  Upper Extremity Assessment: Defer to OT evaluation    Lower Extremity Assessment Lower Extremity Assessment: Generalized weakness (L LE weaker than R)    Cervical / Trunk Assessment Cervical /  Trunk Assessment: Normal  Communication   Communication: No difficulties  Cognition Arousal/Alertness: Awake/alert Behavior During Therapy: WFL for tasks assessed/performed Overall Cognitive Status: Within Functional Limits for tasks assessed                                        General Comments      Exercises     Assessment/Plan    PT Assessment Patient needs continued PT services  PT Problem List Decreased strength;Decreased activity tolerance;Decreased balance;Decreased mobility;Pain       PT Treatment Interventions DME instruction;Gait training;Stair training;Functional mobility training;Therapeutic activities;Therapeutic exercise;Balance training;Patient/family education    PT Goals (Current goals can be found in the Care Plan section)  Acute Rehab PT Goals Patient Stated Goal: to reduce pain PT Goal Formulation: With patient/family Time For Goal Achievement: 10/05/20 Potential to Achieve Goals: Good    Frequency Min 5X/week   Barriers to discharge        Co-evaluation               AM-PAC PT "6 Clicks" Mobility  Outcome Measure Help needed turning from your back to your side while in a flat bed without using bedrails?: A Little Help needed moving from lying on your back to sitting on the side of a flat bed without using bedrails?: A Little Help needed moving to and from a bed to a chair (including a wheelchair)?: A Little Help needed standing up from a chair using your arms (e.g., wheelchair or bedside chair)?: A Little Help needed to walk in hospital room?: A Little Help needed climbing 3-5 steps with a railing? : A Little 6 Click Score: 18    End of Session Equipment Utilized During Treatment: Gait belt;Back brace Activity Tolerance: Patient limited by pain Patient left: in bed;with call bell/phone within reach;with family/visitor present Nurse Communication: Mobility status PT Visit Diagnosis: Unsteadiness on feet (R26.81);Muscle  weakness (generalized) (M62.81)    Time: 9191-6606 PT Time Calculation (min) (ACUTE ONLY): 38 min   Charges:   PT Evaluation $PT Eval Moderate Complexity: 1 Mod PT Treatments $Therapeutic Activity: 8-22 mins        Cavan Bearden A. Dan Humphreys PT, DPT Acute Rehabilitation Services Pager [phone] Office [phone]   Viviann Spare 09/21/2020, 5:17 PM

## 2020-09-21 NOTE — Anesthesia Preprocedure Evaluation (Signed)
Anesthesia Evaluation  Patient identified by MRN, date of birth, ID band Patient awake    Reviewed: Allergy & Precautions, NPO status , Patient's Chart, lab work & pertinent test results  Airway Mallampati: II  TM Distance: >3 FB Neck ROM: Full    Dental  (+) Teeth Intact, Dental Advisory Given   Pulmonary    breath sounds clear to auscultation       Cardiovascular  Rhythm:Regular Rate:Normal     Neuro/Psych    GI/Hepatic   Endo/Other    Renal/GU      Musculoskeletal   Abdominal   Peds  Hematology   Anesthesia Other Findings   Reproductive/Obstetrics                             Anesthesia Physical Anesthesia Plan  ASA: II  Anesthesia Plan: General   Post-op Pain Management:    Induction: Intravenous  PONV Risk Score and Plan: Ondansetron and Aprepitant  Airway Management Planned: Oral ETT  Additional Equipment:   Intra-op Plan:   Post-operative Plan:   Informed Consent: I have reviewed the patients History and Physical, chart, labs and discussed the procedure including the risks, benefits and alternatives for the proposed anesthesia with the patient or authorized representative who has indicated his/her understanding and acceptance.     Dental advisory given  Plan Discussed with: CRNA and Anesthesiologist  Anesthesia Plan Comments: (Plan GA with oral ETT and TAP Block with TIVA)        Anesthesia Quick Evaluation

## 2020-09-21 NOTE — Anesthesia Postprocedure Evaluation (Signed)
Anesthesia Post Note  Patient: Sheila Mathews  Procedure(s) Performed: OBLIQUE LUMBAR INTERBODY FUSION LUMBAR FOUR THROUGH FIVE, POSTERIOR SPINAL FUSION INSTRUMENTATION LUMAR FOUR THROUGH FIVE (N/A Spine Lumbar) LATERAL EXPOSURE FOR OBLIQUE LATERAL SPINE SURGERY (N/A Abdomen)     Patient location during evaluation: PACU Anesthesia Type: General Level of consciousness: awake and alert Pain management: pain level controlled Vital Signs Assessment: post-procedure vital signs reviewed and stable Respiratory status: spontaneous breathing, nonlabored ventilation, respiratory function stable and patient connected to nasal cannula oxygen Cardiovascular status: blood pressure returned to baseline and stable Postop Assessment: no apparent nausea or vomiting Anesthetic complications: no   No complications documented.  Last Vitals:  Vitals:   09/21/20 1400 09/21/20 1427  BP: 115/75 116/74  Pulse: 91 90  Resp: 16 20  Temp: 36.7 C 37 C  SpO2: 98% 96%    Last Pain:  Vitals:   09/21/20 1427  TempSrc: Oral  PainSc:                  [name]

## 2020-09-21 NOTE — Anesthesia Procedure Notes (Signed)
Anesthesia Regional Block: TAP block   Pre-Anesthetic Checklist: ,, timeout performed, Correct Patient, Correct Site, Correct Laterality, Correct Procedure, Correct Position, site marked, Risks and benefits discussed, pre-op evaluation,  At surgeon's request and post-op pain management  Laterality: Left  Prep: Maximum Sterile Barrier Precautions used, chloraprep       Needles:  Injection technique: Single-shot  Needle Type: Echogenic Stimulator Needle     Needle Length: 9cm  Needle Gauge: 21     Additional Needles:   Narrative:  Start time: 09/21/2020 6:55 AM End time: 09/21/2020 7:00 AM Injection made incrementally with aspirations every 5 mL.  Performed by: Personally  Anesthesiologist: Kipp Brood, MD  Additional Notes: 30 cc 0.25% Bupivacaine 1:200 epi 10 cc 1.3% Exparel

## 2020-09-21 NOTE — Op Note (Signed)
OPERATIVE REPORT  DATE OF SURGERY: 09/21/2020  PATIENT NAME:  Sheila Mathews MRN: [id number] DOB: 03-30-1976  PCP: Felix Pacini A, DO  PRE-OPERATIVE DIAGNOSIS: Postlaminectomy syndrome with degenerative lumbar disc disease and discogenic low back pain  POST-OPERATIVE DIAGNOSIS: Same  PROCEDURE:   Oblique lumbar interbody fusion L4-5. Posterior spinal fusion instrumentation L4-5  SURGEON:  Venita Lick, MD  PHYSICIAN ASSISTANT: Glynis Smiles, PA  Approach surgeon: Dr. Sherald Hess.  ANESTHESIA:   General  EBL: 175 ml  Implants: Medtronic pivot peek intervertebral oblique cage.  12 x 50 mm length. NuVasive MIS pedicle screw fixation.  6.5 x 40 mm length screws.  Neuro monitoring: No abnormal free running EMG or SSEP activity.  All 4 pedicle screws were directly stimulated and there was no adverse activity greater than 40  Allograft: Osteocell, DBX.  BRIEF HISTORY: Sheila Mathews is a 45 y.o. female who had a prior lumbar discectomy in 2018 and unfortunately has ongoing continuous back pain.  Despite conservative management her quality of life is continued to deteriorate.  She ultimately had intradiscal Marcaine injection and noted that her quality of life had improved.  As result we elected to move forward with an L4-5 fusion.  All appropriate risks, benefits, alternatives to surgery were discussed with the patient and consent was obtained.  PROCEDURE DETAILS: Patient was brought into the operating room. After successful induction of general anesthesia and endotracheal intubation a Time Out was done. This confirmed all pertinent important data.  Patient was then shifted into the lateral decubitus position.  Axillary roll was placed and all bony prominences well-padded.  Once the patient was in the lateral decubitus position left side up she was secured to the table with tape to prevent infection.  I confirmed with fluoroscopy that we had proper positioning of the L4-5 disc  space in both planes.  At this point time Dr. Chestine Spore scrubbed into the case and performed a standard oblique retroperitoneal approach to the lumbar spine.  He placed retractors to visualize the L4-5 disc space.  Please refer to his dictation for specifics.  Needle was placed into the L4-5 disc space and intraoperative fluoroscopy view confirmed we are at the appropriate level.  At this point time Dr. Chestine Spore exited the case and I scrubbed in.  Timeout was done again confirming that we are moving forward with the L4-5 oblique lumbar interbody fusion.  An annulotomy was performed with a 10 blade scalpel.  Osteotome was used to release the cartilaginous endplates and disrupt the contralateral annulus.  I then placed the 6 mm followed by the 8 mm box osteotome to remove the bulk of the disc material.  I advanced these box osteotomes using fluoroscopic guidance.  Then using curettes and pituitary rongeurs I remove the remainder of the disc material.  I make sure I had removed the cartilaginous endplates I had bleeding subchondral bone.  I was able to visualize posteriorly and I placed a nerve hook and under live fluoroscopy confirmed that I was just beyond the posterior wall of the vertebral body.  At this point with the discectomy complete I then placed my trial implants in.  I felt as though the size 12 x 50 provided the best overall distraction of the intervertebral space with indirect foraminal decompression.  The implant was obtained and packed with the allograft.  I then malleted into the appropriate depth.  I confirmed satisfactory position of the implant in both the AP and lateral planes.  The graft itself  was firmly positioned.  I remove the inserting device and irrigated wound copiously with normal saline.  I then placed FloSeal along the iliac vein and remove the retractors.  I confirmed there was no bleeding.  I then closed the fascia of the external oblique with interrupted #1 Vicryl sutures superficial with  2-0 Vicryl sutures, and a 3-0 Monocryl for the skin.  At this point time a AP x-ray was taken and read by the radiologist confirming that there was no retained surgical treatments in the abdominal cavity.  With the radiologist having clear to abdomen per our protocol I then moved forward to the posterior fixation.  With the patient remaining in the lateral position identified the lateral border of the L4 and L5 pedicle and marked amount.  I infiltrated the area with quarter percent Marcaine with epinephrine.  Small stab incisions were made and the Jamshidi needle was advanced percutaneously down to the lateral aspect of the L4 pedicle.  I confirmed position using AP fluoroscopy.  I then advanced the Jamshidi needle using fluoroscopic guidance as well as neuro monitoring.  As I neared the medial wall of the pedicle I confirmed that in the lateral view that I was beyond the posterior wall the vertebral body.  Having confirmed trajectory and position I advanced the Jamshidi needle into the vertebral body and then cannulated the pedicle.  I repeated this exact same procedure at L5 and on the contralateral L4 and L5 pedicles.  Once all 4 pedicles were cannulated I then measured and obtained the 6.5 x 40 mm length screws and advanced over the guidepin.  All 4 screws had excellent purchase.  Guide pins were removed.  I then directly stimulated each of the screws and there was no adverse activity at greater than 40 mA..  I then measured and placed the appropriate sized rod and secured it to the pedicle screw construct.  All 4 locking caps were then tightened according manufacture standards.  Final x-rays were taken which demonstrated satisfactory overall alignment of the pedicle screw rod construct as well as intervertebral cage.  The insertion tabs were broken off and removed.  The posterior 4 wounds were irrigated and closed in a layered fashion with interrupted #1 Vicryl suture, 2-0 Vicryl suture, and a 3-0 Monocryl.   Steri-Strips were applied to all wounds as were dry dressings.  The patient was ultimately extubated transfer the PACU without incident.  At the end of the case all needle and sponge counts were correct.  There were no adverse intraoperative events.  Venita Lick, MD 09/21/2020 12:14 PM

## 2020-09-21 NOTE — H&P (Signed)
History and Physical Interval Note:  09/21/2020 7:31 AM  [name]  has presented today for surgery, with the diagnosis of Degenerative disc disease, post laminectomy syndrome L4-5.  The various methods of treatment have been discussed with the patient and family. After consideration of risks, benefits and other options for treatment, the patient has consented to  Procedure(s): ANTERIOR AND POSTERIOR SPINAL FUSION (OLIF l4-5 WITH POSTERIOR SPINAL FUSION INTERBODY) (N/A) ABDOMINAL EXPOSURE (N/A) as a surgical intervention.  The patient's history has been reviewed, patient examined, no change in status, stable for surgery.  I have reviewed the patient's chart and labs.  Questions were answered to the patient's satisfaction.    OLIF L4-L5, risks and benefits discussed.  Cephus Shelling  Patient name: LEIYAH MAULTSBY            MRN: [id number]        DOB: 07/23/1975          Sex: female  REASON FOR CONSULT: Evaluate for L4-L5 OLIF  HPI: SHARIECE VIVEIROS is a 45 y.o. female, with history depression that presents for evaluation of L4-L5 OLIF with Dr. Shon Baton.  She reports chronic lower back pain since 2016.  She previously underwent a L5-S1 microlumbar decompression with microdiscectomy in 2018 by Dr. Shelle Iron.  She has since undergone additional injections with some improvement in the left leg radiculopathy but otherwise has had persistent lower back pain.  She has been evaluated by Dr. Shon Baton who has recommended L4-L5 OLIF with posterior intstrumentation.  She describes two previous 2 C-sections and a laparoscopic procedure.  She works for [employer].  Three children.      Past Medical History:  Diagnosis Date  . Abnormal cervical Papanicolaou smear 04/20/2020  . Allergies   . Anxiety   . Bipolar disorder (HCC)   . Chicken pox   . Chronic sinusitis 07/26/2016  . Complication of anesthesia    pt doesn't like mask she has panic attack; childhood  . Depression   . GERD  (gastroesophageal reflux disease)   . Migraines   . Pelvic pain in female 05/13/2014  . Pneumonia   . PONV (postoperative nausea and vomiting)    no issues with short procdures, had one isntance of vomitus following a longer procedure   . Sciatica          Past Surgical History:  Procedure Laterality Date  . CESAREAN SECTION  x2   2000, 2005  . LAPAROSCOPY N/A 05/13/2014   Procedure: LAPAROSCOPY DIAGNOSTIC;  Surgeon: Lavina Hamman, MD;  Location: [facility];  Service: Gynecology;  Laterality: N/A;  . LUMBAR LAMINECTOMY/DECOMPRESSION MICRODISCECTOMY Left 02/12/2017   Procedure: Microlumbar decompression L5-S1 left ;  Surgeon: Jene Every, MD;  Location: [facility];  Service: Orthopedics;  Laterality: Left;  90 mins  . SALIVARY GLAND SURGERY  1986  . UPPER GI ENDOSCOPY    . WISDOM TOOTH EXTRACTION           Family History  Problem Relation Age of Onset  . Cancer Maternal Grandmother        breast   . Breast cancer Maternal Grandmother   . Cancer Maternal Grandfather        head neck   . Diabetes Maternal Grandfather   . Cancer Paternal Grandmother        gallbladder  . Cancer Paternal Grandfather        colon   . Diabetes Paternal Grandfather   . Depression Mother   . Mental illness Mother   .  Diabetes Brother   . Drug abuse Brother   . Early death Brother   . Mental illness Brother     SOCIAL HISTORY: Social History        Socioeconomic History  . Marital status: Married    Spouse name: Not on file  . Number of children: 3  . Years of education: Not on file  . Highest education level: Bachelor's degree (e.g., BA, AB, BS)  Occupational History  . Not on file  Tobacco Use  . Smoking status: Never Smoker  . Smokeless tobacco: Never Used  Vaping Use  . Vaping Use: Never used  Substance and Sexual Activity  . Alcohol use: Yes    Comment: social  . Drug use: No  . Sexual activity: Yes    Partners: Male  Other Topics  Concern  . Not on file  Social History Narrative   Marital status/children/pets: Married, 3 children.    Education/employment: Oncologist, healthcare advocate   Safety:      -smoke alarm in the home:Yes     - wears seatbelt: Yes     - Feels safe in their relationships: Yes   Right handed   No caffeine   One story home   Social Determinants of Health   Financial Resource Strain: Not on file  Food Insecurity: Not on file  Transportation Needs: Not on file  Physical Activity: Not on file  Stress: Not on file  Social Connections: Not on file  Intimate Partner Violence: Not on file         Allergies  Allergen Reactions  . Almond (Diagnostic) Anaphylaxis  . Codeine Other (See Comments)    Makes patient aggressive    . Hydrocodone Other (See Comments)    Makes patient aggressive           Current Outpatient Medications  Medication Sig Dispense Refill  . clonazePAM (KLONOPIN) 0.5 MG tablet Take 0.5 mg by mouth 3 (three) times daily as needed for anxiety.    Dorise Hiss (AIMOVIG) 140 MG/ML SOAJ Inject 140 mg into the skin every 28 (twenty-eight) days. 1.12 mL 5  . hydrocortisone 1 % ointment Apply 1 application topically 2 (two) times daily. 30 g 2  . lamoTRIgine (LAMICTAL) 200 MG tablet Take 200 mg by mouth daily.    Marland Kitchen levothyroxine (SYNTHROID) 25 MCG tablet Take 1 tablet (25 mcg total) by mouth daily. 90 tablet 3  . lithium carbonate (LITHOBID) 300 MG CR tablet Take 900 mg by mouth daily.    Marland Kitchen Plecanatide (TRULANCE) 3 MG TABS Take 3 mg by mouth daily.    . sertraline (ZOLOFT) 100 MG tablet Take 150 mg by mouth daily.    . SUMAtriptan (IMITREX) 100 MG tablet Take 100 mg by mouth every 2 (two) hours as needed for migraine.    Marland Kitchen zolpidem (AMBIEN) 10 MG tablet Take 10 mg by mouth at bedtime as needed for sleep.    . Esomeprazole Magnesium 20 MG TBEC Take 20 mg by mouth daily at 12 noon. (Patient not taking: Reported on 09/12/2020)     . famotidine (PEPCID) 20 MG tablet Take 1 tablet (20 mg total) by mouth 2 (two) times daily. (Patient not taking: No sig reported) 60 tablet 5  . pantoprazole (PROTONIX) 40 MG tablet Take 1 tablet (40 mg total) by mouth daily. (Patient not taking: No sig reported) 90 tablet 3   No current facility-administered medications for this visit.    REVIEW OF SYSTEMS:  [X]  denotes  positive finding, [ ]  denotes negative finding Cardiac  Comments:  Chest pain or chest pressure:    Shortness of breath upon exertion:    Short of breath when lying flat:    Irregular heart rhythm:        Vascular    Pain in calf, thigh, or hip brought on by ambulation:    Pain in feet at night that wakes you up from your sleep:     Blood clot in your veins:    Leg swelling:         Pulmonary    Oxygen at home:    Productive cough:     Wheezing:         Neurologic    Sudden weakness in arms or legs:     Sudden numbness in arms or legs:     Sudden onset of difficulty speaking or slurred speech:    Temporary loss of vision in one eye:     Problems with dizziness:         Gastrointestinal    Blood in stool:     Vomited blood:         Genitourinary    Burning when urinating:     Blood in urine:        Psychiatric    Major depression:         Hematologic    Bleeding problems:    Problems with blood clotting too easily:        Skin    Rashes or ulcers:        Constitutional    Fever or chills:      PHYSICAL EXAM:    Vitals:   09/12/20 1457  BP: 114/75  Pulse: 63  Resp: 14  Temp: 97.7 F (36.5 C)  TempSrc: Temporal  SpO2: 96%  Weight: 197 lb (89.4 kg)  Height: 5\' 6"  (1.676 m)    GENERAL: The patient is a well-nourished female, in no acute distress. The vital signs are documented above. CARDIAC: There is a regular rate and rhythm.  VASCULAR:  Palpable radial pulses bilateral upper  extremities Femoral pulses palpable bilaterally Palpable DP pulses bilaterally PULMONARY: No respiratory distress. ABDOMEN: Soft and non-tender. MUSCULOSKELETAL: There are no major deformities or cyanosis. NEUROLOGIC: No focal weakness or paresthesias are detected. SKIN: There are no ulcers or rashes noted. PSYCHIATRIC: The patient has a normal affect.  DATA:   MRI disc with recent images from [facility] does not work and our office has requested a new copy for review.  Assessment/Plan:  45 year old female with chronic lower back pain with radiculopathy that presents for preop evaluation of L4-L5 OLIF with Dr. .  Unfortunately her MRI disc does not work today and we have requested a new copy from 45.  Otherwise she does not appear to have any issues that would prevent her from being a good candidate for L4-L5 OLIF.  I discussed incision over the left oblique muscles in the right lateral position and subsequently mobilizing the peritoneum as well as the left ureter and the left iliac vessels including artery and vein to expose the L4-L5 disc space.  We talked about injury to the above structures.  Look forward to assisting Dr. Shon Baton and again I will review her images when our office gets a new disc.   [place], MD [facility] Office: [phone]

## 2020-09-21 NOTE — Anesthesia Procedure Notes (Signed)
Procedure Name: Intubation Date/Time: 09/21/2020 7:55 AM Performed by: Lytle Michaels, CRNA Pre-anesthesia Checklist: Patient identified, Emergency Drugs available, Suction available and Patient being monitored Patient Re-evaluated:Patient Re-evaluated prior to induction Oxygen Delivery Method: Circle system utilized Preoxygenation: Pre-oxygenation with 100% oxygen Induction Type: IV induction Ventilation: Mask ventilation without difficulty Laryngoscope Size: Miller and 2 Tube type: Oral Tube size: 7.0 mm Number of attempts: 1 Airway Equipment and Method: Stylet and Oral airway Placement Confirmation: ETT inserted through vocal cords under direct vision,  positive ETCO2 and breath sounds checked- equal and bilateral Secured at: 21 cm Tube secured with: Tape Dental Injury: Teeth and Oropharynx as per pre-operative assessment

## 2020-09-21 NOTE — Op Note (Signed)
Date: September 21, 2020  Preoperative diagnosis: Chronic lower back pain  Postoperative diagnosis: Same  Procedure: Spine exposure at the L4-L5 disc space for oblique lumbar interbody fusion at L4-5  Surgeon: Dr. Cephus Shelling, MD  Co-surgeon: Dr. Venita Lick, MD  Assistant: Marchelle Folks Ward  Indications: Patient is a 45 year old female who has chronic lower back pain and has failed conservative management.  She has been evaluated by Dr. Shon Baton who has recommended an L4-L5 OLIF.  Vascular surgery was asked to assist with abdominal exposure and she presents after risk benefits discussed  Findings: Oblique incision over the left oblique muscles in the right lateral position over the L4-L5 disc space that had been marked on the abdominal wall.  After the fascia was opened, the oblique muscles were opened with muscle-sparing technique and I mobilized peritoneum as well as the left ureter to expose the L4-L5 disc space from an anterior/oblique approach.  This did require mobilization of the left iliac artery and vein medial and psoas muscle lateral.  Spinal needle was placed in the disc space to confirm we were at the correct level.    Anesthesia: General  Details: Patient was taken to the operating room after informed consent was obtained.  Placed on the operative table supine position.  General endotracheal anesthesia was induced.  She was then placed in the right lateral position and all pressure points were padded.  Fluoroscopic C-arm was used in the lateral position to identify the L4-L5 disc space that was marked over the left lateral abdominal wall where her oblique muscles were located.  Her abdominal wall was then prepped and draped in usual sterile fashion.  Timeout was performed.  Initially made a oblique incision over the left oblique muscles at our preoperative mark.  Dissected down with Bovie cautery until we encountered the anterior sheath of the external oblique muscles and then I  opened the fascia and entered through the external/internal obliques muscle-sparing ultimately through the transversus and identified peritoneum.  This was bluntly swept down with my finger until I could feel psoas and left iliac vessels.  I then used hand-held Wiley retractor for added visualization.  Identified the left ureter that was swept down with the patient in the right lateral position..  The left psoas muscle was then mobilized lateral to the disc space with laparoscopic pusher and the left iliac artery and vein was mobilized medial to the disc space.  I did have to ligate 1 lumbar branch with vessel clips and then there was a segmental behind this that I tied off with a 5-0 Prolene.  Ultimately after enough mobilization the OLIF retractor was placed on the field.  I used 170 cm wide lip retractors to pull the iliac vessels to the right side of the disc space with the patient in the right lateral position and exposde the disc space from the front.  I then used another 170 wide lip retractors to pull the psoas muscle lateral.  My assistant also used a hand-held Wiley to fully expose the disc space.  I did have to bluntly mobilize the left iliac vein.  Once we had good exposure a spinal needle was placed in the disc space and I confirmed on lateral fluoroscopy that we were at the correct level at L4-5.  Case was turned over to Dr. Shon Baton. Please see his dictation for the remainder of the case.  Complication: None  Condition: Stable  Cephus Shelling, MD [facility] Office:  [phone]   Cephus Shelling

## 2020-09-22 ENCOUNTER — Inpatient Hospital Stay (HOSPITAL_COMMUNITY): Payer: Managed Care, Other (non HMO)

## 2020-09-22 DIAGNOSIS — Z9889 Other specified postprocedural states: Secondary | ICD-10-CM

## 2020-09-22 NOTE — Progress Notes (Signed)
Lower extremity venous bilateral study completed.  Preliminary results relayed to RN.   See CV Proc for preliminary results report.   Porter Nakama, RDMS, RVT  

## 2020-09-22 NOTE — Evaluation (Signed)
Occupational Therapy Evaluation Patient Details Name: Sheila Mathews MRN: [id number] DOB: [birth year] Today's Date: 09/22/2020    History of Present Illness 45 y/o female s/p anterior and posterior L4-5 spinal fusion on 3/24. PMH: L5-S1 microlumbar decomp in 2018, bipolar disorder, depression, GERD   Clinical Impression   Patient lives with spouse and children in a single level house. Patient is independent with self care with intermittent use of cane due to previous back surgeries. Patient educated in back precautions and how to maintain during ADLs. Patient return demo during UB/LB dressing, oral care and functional transfers at mod I level. No further acute OT needs at this time, will sign off. Please re-consult if new needs arise.    Follow Up Recommendations  No OT follow up    Equipment Recommendations  None recommended by OT       Precautions / Restrictions Precautions Precautions: Fall;Back Precaution Booklet Issued: Yes (comment) Precaution Comments: reviewed precautions during treatment Required Braces or Orthoses: Spinal Brace Spinal Brace: Lumbar corset;Applied in sitting position Restrictions Weight Bearing Restrictions: No      Mobility Bed Mobility Overal bed mobility: Modified Independent             General bed mobility comments: able to demo log roll to get out of bed    Transfers Overall transfer level: Modified independent Equipment used: Rolling walker (2 wheeled)             General transfer comment: no physical assistance needed    Balance Overall balance assessment: Mild deficits observed, not formally tested                                         ADL either performed or assessed with clinical judgement   ADL Overall ADL's : Modified independent                                       General ADL Comments: educated patient on how to maintain back precautions during ADL tasks, patient demonstrated  UB/LB dressing, oral care and functional transfers at mod I level without physical assistance and maintaining precautions                  Pertinent Vitals/Pain Pain Assessment: Faces Faces Pain Scale: Hurts a little bit Pain Location: back Pain Descriptors / Indicators: Discomfort Pain Intervention(s): Premedicated before session     Hand Dominance Right   Extremity/Trunk Assessment Upper Extremity Assessment Upper Extremity Assessment: Overall WFL for tasks assessed   Lower Extremity Assessment Lower Extremity Assessment: Defer to PT evaluation   Cervical / Trunk Assessment Cervical / Trunk Assessment: Normal   Communication Communication Communication: No difficulties   Cognition Arousal/Alertness: Awake/alert Behavior During Therapy: WFL for tasks assessed/performed Overall Cognitive Status: Within Functional Limits for tasks assessed                                                Home Living Family/patient expects to be discharged to:: Private residence Living Arrangements: Spouse/significant other;Children Available Help at Discharge: Family;Available 24 hours/day Type of Home: House Home Access: Stairs to enter [employer] of Steps: 2 Entrance Stairs-Rails: None Home Layout: One  level (one step down into living room)     Bathroom Shower/Tub: Producer, television/film/video: Standard     Home Equipment: None          Prior Functioning/Environment Level of Independence: Independent        Comments: used SPC occasionally        OT Problem List: Pain;Decreased activity tolerance;Impaired balance (sitting and/or standing)         OT Goals(Current goals can be found in the care plan section) Acute Rehab OT Goals Patient Stated Goal: to reduce pain OT Goal Formulation: All assessment and education complete, DC therapy   AM-PAC OT "6 Clicks" Daily Activity     Outcome Measure Help from another person eating  meals?: None Help from another person taking care of personal grooming?: None Help from another person toileting, which includes using toliet, bedpan, or urinal?: None Help from another person bathing (including washing, rinsing, drying)?: None Help from another person to put on and taking off regular upper body clothing?: None Help from another person to put on and taking off regular lower body clothing?: None 6 Click Score: 24   End of Session Equipment Utilized During Treatment: Rolling walker Nurse Communication: Mobility status  Activity Tolerance: Patient tolerated treatment well Patient left: with call bell/phone within reach;Other (comment) (seated EOB to eat)  OT Visit Diagnosis: Pain Pain - part of body:  (back)                Time: 2248-2500 OT Time Calculation (min): 18 min Charges:  OT General Charges $OT Visit: 1 Visit OT Evaluation $OT Eval Low Complexity: 1 Low  Marlyce Huge OT OT pager: [phone]  Carmelia Roller 09/22/2020, 9:07 AM

## 2020-09-22 NOTE — Progress Notes (Signed)
Patient is discharged from room 3C05 at this time. Alert and in stable condition. IV site d/c'd and instructions read to patient and spouse with understanding verbalized and all questions answered. Left unit via wheelchair with all belongings at side.  

## 2020-09-22 NOTE — Progress Notes (Signed)
Physical Therapy Treatment Patient Details Name: Sheila Mathews MRN: [id number] DOB: 06-Dec-1975 Today's Date: 09/22/2020    History of Present Illness 45 y/o female s/p anterior and posterior L4-5 spinal fusion on 3/24. PMH: L5-S1 microlumbar decomp in 2018, bipolar disorder, depression, GERD    PT Comments    Pt progressing well with post-op mobility. She was able to demonstrate transfers and ambulation with up to min assist for balance support and safety due to continued LLE weakness. Pt with 1 posterior LOB during turn on stairs to descend but able to recover with min assist. Pt was educated on precautions, brace application/wearing schedule, appropriate activity progression, and car transfer. Will continue to follow.      Follow Up Recommendations  Follow surgeon's recommendation for DC plan and follow-up therapies     Equipment Recommendations  Rolling walker with 5" wheels    Recommendations for Other Services       Precautions / Restrictions Precautions Precautions: Fall;Back Precaution Booklet Issued: Yes (comment) Precaution Comments: Briefly reviewed handout and pt was cued for precautions during functional mobility. Required Braces or Orthoses: Spinal Brace Spinal Brace: Lumbar corset;Applied in sitting position Restrictions Weight Bearing Restrictions: No    Mobility  Bed Mobility Overal bed mobility: Modified Independent Bed Mobility: Rolling;Sidelying to Sit;Sit to Sidelying           General bed mobility comments: HOB flat and min use of rails. No assist to transition to/from supine with log roll technique.    Transfers Overall transfer level: Modified independent Equipment used: Rolling walker (2 wheeled) Transfers: Sit to/from Stand           General transfer comment: Pt demonstrated proper hand placement on seated surface for safety. No assist required.  Ambulation/Gait Ambulation/Gait assistance: Supervision;Min guard Gait Distance (Feet):  300 Feet Assistive device: None;Rolling walker (2 wheeled) Gait Pattern/deviations: Step-through pattern;Decreased stride length;Wide base of support;Antalgic Gait velocity: decreased Gait velocity interpretation: <1.8 ft/sec, indicate of risk for recurrent falls General Gait Details: Slow but generally steady with RW for support. Min guard assist provided as pt fatigued.   Stairs Stairs: Yes Stairs assistance: Min assist;Min guard Stair Management: One rail Left;Step to pattern;Forwards Number of Stairs: 4 General stair comments: VC's for sequencing and general safety. Assist provided as pt turned to come back down the stairs - pt had a posterior LOB.   Wheelchair Mobility    Modified Rankin (Stroke Patients Only)       Balance Overall balance assessment: Mild deficits observed, not formally tested                                          Cognition Arousal/Alertness: Awake/alert Behavior During Therapy: WFL for tasks assessed/performed Overall Cognitive Status: Within Functional Limits for tasks assessed                                        Exercises      General Comments        Pertinent Vitals/Pain Pain Assessment: Faces Faces Pain Scale: Hurts a little bit Pain Location: back Pain Descriptors / Indicators: Discomfort Pain Intervention(s): Limited activity within patient's tolerance;Monitored during session;Repositioned    Home Living Family/patient expects to be discharged to:: Private residence Living Arrangements: Spouse/significant other;Children Available Help at Discharge: Family;Available 24 hours/day  Type of Home: House Home Access: Stairs to enter Entrance Stairs-Rails: None Home Layout: One level (one step down into living room) Home Equipment: None      Prior Function Level of Independence: Independent      Comments: used SPC occasionally   PT Goals (current goals can now be found in the care plan  section) Acute Rehab PT Goals Patient Stated Goal: LLE improved PT Goal Formulation: With patient/family Time For Goal Achievement: 10/05/20 Potential to Achieve Goals: Good Progress towards PT goals: Progressing toward goals    Frequency    Min 5X/week      PT Plan Current plan remains appropriate    Co-evaluation              AM-PAC PT "6 Clicks" Mobility   Outcome Measure  Help needed turning from your back to your side while in a flat bed without using bedrails?: None Help needed moving from lying on your back to sitting on the side of a flat bed without using bedrails?: None Help needed moving to and from a bed to a chair (including a wheelchair)?: A Little Help needed standing up from a chair using your arms (e.g., wheelchair or bedside chair)?: None Help needed to walk in hospital room?: A Little Help needed climbing 3-5 steps with a railing? : A Little 6 Click Score: 21    End of Session Equipment Utilized During Treatment: Gait belt;Back brace Activity Tolerance: Patient tolerated treatment well Patient left: in bed;with call bell/phone within reach;with family/visitor present Nurse Communication: Mobility status PT Visit Diagnosis: Unsteadiness on feet (R26.81);Muscle weakness (generalized) (M62.81)     Time: 1884-1660 PT Time Calculation (min) (ACUTE ONLY): 21 min  Charges:  $Gait Training: 8-22 mins                     Conni Slipper, PT, DPT Acute Rehabilitation Services Pager: [phone] Office: [phone]    Marylynn Pearson 09/22/2020, 10:00 AM

## 2020-09-22 NOTE — Progress Notes (Addendum)
  Progress Note    09/22/2020 7:34 AM 1 Day Post-Op  Subjective:  Tolerating diet and voiding. + flatus without N or v. C/o numbness of both great toes   Vitals:   09/22/20 0416 09/22/20 0720  BP: 101/65 106/66  Pulse: 69 73  Resp: 18 16  Temp: 98.2 F (36.8 C) 97.7 F (36.5 C)  SpO2: 97% 95%    Physical Exam: General appearance: Awake, alert in no apparent distress Cardiac: Heart rate and rhythm are regular Respirations: Nonlabored; CTAB Incisions: Left lower quadrant incision with intact dressing and is well approximated without bleeding or hematoma Extremities: Both feet are warm with intact motor function.  2+ right DP and 2+ left PT pulses    CBC    Component Value Date/Time   WBC 16.2 (H) 09/21/2020 1449   RBC 3.88 09/21/2020 1449   HGB 11.3 (L) 09/21/2020 1449   HCT 35.8 (L) 09/21/2020 1449   PLT 347 09/21/2020 1449   MCV 92.3 09/21/2020 1449   MCH 29.1 09/21/2020 1449   MCHC 31.6 09/21/2020 1449   RDW 14.6 09/21/2020 1449   LYMPHSABS 2.0 06/06/2020 0842   MONOABS 0.7 06/06/2020 0842   EOSABS 0.2 06/06/2020 0842   BASOSABS 0.1 06/06/2020 0842    BMET    Component Value Date/Time   NA 135 09/19/2020 0931   K 3.6 09/19/2020 0931   CL 102 09/19/2020 0931   CO2 27 09/19/2020 0931   GLUCOSE 90 09/19/2020 0931   BUN 8 09/19/2020 0931   CREATININE 0.90 09/21/2020 1449   CALCIUM 9.3 09/19/2020 0931   GFRNONAA >60 09/21/2020 1449   GFRAA >60 02/05/2017 1414     Intake/Output Summary (Last 24 hours) at 09/22/2020 0734 Last data filed at 09/21/2020 1718 Gross per 24 hour  Intake 2050 ml  Output 1075 ml  Net 975 ml    HOSPITAL MEDICATIONS Scheduled Meds: . dexamethasone (DECADRON) injection  4 mg Intravenous Q6H   Or  . dexamethasone  4 mg Oral Q6H  . docusate sodium  100 mg Oral BID  . enoxaparin (LOVENOX) injection  40 mg Subcutaneous Q24H  . gabapentin  300 mg Oral TID  . lamoTRIgine  200 mg Oral Daily  . levothyroxine  25 mcg Oral Q0600   . lithium carbonate  900 mg Oral Daily  . sertraline  150 mg Oral Daily  . sodium chloride flush  3 mL Intravenous Q12H   Continuous Infusions: . sodium chloride    . lactated ringers    . methocarbamol (ROBAXIN) IV     PRN Meds:.acetaminophen **OR** acetaminophen, clonazePAM, HYDROmorphone (DILAUDID) injection, methocarbamol **OR** methocarbamol (ROBAXIN) IV, ondansetron **OR** ondansetron (ZOFRAN) IV, oxyCODONE, oxyCODONE, polyethylene glycol, sodium chloride flush, sodium phosphate  Assessment and Plan: POD 1 Spine exposure at the L4-L5 disc space for oblique lumbar interbody fusion at L4-5 by Dr. Chestine Spore. LE well perfused with normal GI function. VSS. Afebrile.    -DVT prophylaxis:  Lovenox   Wendi Maya, PA-C [facility] [phone] 09/22/2020  7:34 AM   I have seen and evaluated the patient. I agree with the PA note as documented above.  Postop day 1 status post L4-L5 OLIF.  This morning her abdomen is nice and soft with appropriate postop incisional tenderness.  Tolerating p.o.  Palpable left pedal pulse.  No immediate concerns from my standpoint.  Cephus Shelling, MD [facility] [place] Office: [phone]

## 2020-09-22 NOTE — Progress Notes (Signed)
    Subjective: Procedure(s) (LRB): OBLIQUE LUMBAR INTERBODY FUSION LUMBAR FOUR THROUGH FIVE, POSTERIOR SPINAL FUSION INSTRUMENTATION LUMAR FOUR THROUGH FIVE (N/A) LATERAL EXPOSURE FOR OBLIQUE LATERAL SPINE SURGERY (N/A) 1 Day Post-Op  Patient reports pain as 2 on 0-10 scale.  Reports decreased leg pain reports incisional back pain   Positive void Negative bowel movement Positive flatus Negative chest pain or shortness of breath  Objective: Vital signs in last 24 hours: Temp:  [97.7 F (36.5 C)-98.8 F (37.1 C)] 97.7 F (36.5 C) (03/25 0720) Pulse Rate:  [69-91] 73 (03/25 0720) Resp:  [13-20] 16 (03/25 0720) BP: (99-119)/(60-77) 106/66 (03/25 0720) SpO2:  [95 %-100 %] 95 % (03/25 0720) FiO2 (%):  [20 %] 20 % (03/24 2310)  Intake/Output from previous day: 03/24 0701 - 03/25 0700 In: 2050 [I.V.:1800; IV Piggyback:250] Out: 1075 [Urine:900; Blood:175]  Labs: Recent Labs    09/19/20 0931 09/21/20 1449  WBC 8.8 16.2*  RBC 4.45 3.88  HCT 41.0 35.8*  PLT 410* 347   Recent Labs    09/19/20 0931 09/21/20 1449  NA 135  --   K 3.6  --   CL 102  --   CO2 27  --   BUN 8  --   CREATININE 0.75 0.90  GLUCOSE 90  --   CALCIUM 9.3  --    Recent Labs    09/19/20 0931  INR 1.0    Physical Exam: Neurologically intact ABD soft Intact pulses distally Incision: dressing C/D/I and no drainage Compartment soft Body mass index is 33.64 kg/m.   Assessment/Plan: Patient stable  xrays n/a Continue mobilization with physical therapy Continue care  1. LE doppler pending today 2. Plan on d/c to home if cleared by therapy 3. Doing well overall  Venita Lick, MD [facility] [phone]

## 2020-09-24 ENCOUNTER — Encounter (HOSPITAL_COMMUNITY): Payer: Self-pay | Admitting: Orthopedic Surgery

## 2020-09-25 MED FILL — Sodium Chloride IV Soln 0.9%: INTRAVENOUS | Qty: 1000 | Status: AC

## 2020-09-25 MED FILL — Heparin Sodium (Porcine) Inj 1000 Unit/ML: INTRAMUSCULAR | Qty: 30 | Status: AC

## 2020-09-25 NOTE — Discharge Summary (Signed)
Patient ID: Sheila Mathews MRN: [id number] DOB/AGE: 43/23/1977 43 y.o.  Admit date: 09/21/2020 Discharge date: 09/25/2020  Admission Diagnoses:  Active Problems:   S/P lumbar fusion   Discharge Diagnoses:  Active Problems:   S/P lumbar fusion  status post Procedure(s): OBLIQUE LUMBAR INTERBODY FUSION LUMBAR FOUR THROUGH FIVE, POSTERIOR SPINAL FUSION INSTRUMENTATION LUMAR FOUR THROUGH FIVE LATERAL EXPOSURE FOR OBLIQUE LATERAL SPINE SURGERY  Past Medical History:  Diagnosis Date  . Abnormal cervical Papanicolaou smear 04/20/2020  . Allergies   . Anxiety   . Bipolar disorder (HCC)   . Chicken pox   . Chronic sinusitis 07/26/2016  . Complication of anesthesia    pt doesn't like mask she has panic attack; childhood  . Depression   . Family history of adverse reaction to anesthesia    Mother PONV  . GERD (gastroesophageal reflux disease)   . History of anemia    during pregnancy  . Migraines   . Pelvic pain in female 05/13/2014  . Pneumonia   . PONV (postoperative nausea and vomiting)    no issues with short procdures, had one isntance of vomitus following a longer procedure   . Sciatica   . Thyroid disease     Surgeries: Procedure(s): OBLIQUE LUMBAR INTERBODY FUSION LUMBAR FOUR THROUGH FIVE, POSTERIOR SPINAL FUSION INSTRUMENTATION LUMAR FOUR THROUGH FIVE LATERAL EXPOSURE FOR OBLIQUE LATERAL SPINE SURGERY on 09/21/2020   Consultants: Treatment Team:  Cephus Shelling, MD  Discharged Condition: Improved  Hospital Course: Sheila Mathews is an 45 y.o. 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 Patient failed conservative treatments (please see the history and physical for the specifics) and had severe unremitting pain that affects sleep, daily activities and work/hobbies. After pre-op clearance, the patient was taken to the operating room on 09/21/2020 and underwent  Procedure(s): OBLIQUE  LUMBAR INTERBODY FUSION LUMBAR FOUR THROUGH FIVE, POSTERIOR SPINAL FUSION INSTRUMENTATION LUMAR FOUR THROUGH FIVE LATERAL EXPOSURE FOR OBLIQUE LATERAL SPINE SURGERY.    Patient was given perioperative antibiotics:  Anti-infectives (From admission, onward)   Start     Dose/Rate Route Frequency Ordered Stop   09/21/20 1600  ceFAZolin (ANCEF) IVPB 1 g/50 mL premix        1 g 100 mL/hr over 30 Minutes Intravenous Every 8 hours 09/21/20 1415 09/22/20 0748   09/21/20 0602  ceFAZolin (ANCEF) IVPB 2g/100 mL premix        2 g 200 mL/hr over 30 Minutes Intravenous 30 min pre-op 09/21/20 0602 09/21/20 0830       Patient was given sequential compression devices and early ambulation to prevent DVT.   Patient benefited maximally from hospital stay and there were no complications. At the time of discharge, the patient was urinating/moving their bowels without difficulty, tolerating a regular diet, pain is controlled with oral pain medications and they have been cleared by PT/OT.   Recent vital signs: No data found.   Recent laboratory studies: No results for input(s): WBC, HGB, HCT, PLT, NA, K, CL, CO2, BUN, CREATININE, GLUCOSE, INR, CALCIUM in the last 72 hours.  Invalid input(s): PT, 2   Discharge Medications:   Allergies as of 09/22/2020      Reactions   Almond (diagnostic) Anaphylaxis   Codeine Other (See Comments)   Makes patient aggressive    Hydrocodone Other (See Comments)   Makes patient aggressive       Medication List    STOP taking these medications   Esomeprazole  Magnesium 20 MG Tbec     TAKE these medications   Aimovig 140 MG/ML Soaj Generic drug: Erenumab-aooe Inject 140 mg into the skin every 28 (twenty-eight) days.   clonazePAM 0.5 MG tablet Commonly known as: KLONOPIN Take 0.5 mg by mouth 3 (three) times daily as needed for anxiety.   enoxaparin 40 MG/0.4ML injection Commonly known as: LOVENOX Inject 0.4 mLs (40 mg total) into the skin daily for 10 days. 10 day  supply 1 injection per day   lamoTRIgine 200 MG tablet Commonly known as: LAMICTAL Take 200 mg by mouth daily.   levothyroxine 25 MCG tablet Commonly known as: SYNTHROID Take 1 tablet (25 mcg total) by mouth daily.   lithium carbonate 300 MG CR tablet Commonly known as: LITHOBID Take 900 mg by mouth daily.   methocarbamol 500 MG tablet Commonly known as: Robaxin Take 1 tablet (500 mg total) by mouth every 8 (eight) hours as needed for up to 5 days for muscle spasms.   ondansetron 4 MG tablet Commonly known as: Zofran Take 1 tablet (4 mg total) by mouth every 8 (eight) hours as needed for nausea or vomiting.   oxyCODONE-acetaminophen 10-325 MG tablet Commonly known as: Percocet Take 1 tablet by mouth every 6 (six) hours as needed for up to 5 days for pain.   sertraline 100 MG tablet Commonly known as: ZOLOFT Take 150 mg by mouth daily.   SUMAtriptan 100 MG tablet Commonly known as: IMITREX Take 100 mg by mouth every 2 (two) hours as needed for migraine.   Trulance 3 MG Tabs Generic drug: Plecanatide Take 3 mg by mouth daily.   zolpidem 10 MG tablet Commonly known as: AMBIEN Take 10 mg by mouth at bedtime as needed for sleep.       Diagnostic Studies: DG Chest 2 View  Result Date: 09/20/2020 CLINICAL DATA:  Preop for lumbar surgery. EXAM: CHEST - 2 VIEW COMPARISON:  None. FINDINGS: The heart size and mediastinal contours are within normal limits. Both lungs are clear. The visualized skeletal structures are unremarkable. IMPRESSION: No active cardiopulmonary disease. Electronically Signed   By: Lupita Raider M.D.   On: 09/20/2020 09:22   DG Lumbar Spine 2-3 Views  Result Date: 09/21/2020 CLINICAL DATA:  Surgery, elective. Additional history provided: L4-5 OLIF with pedicle screws. Provided fluoroscopy time 5 minutes, 19 seconds (229.84 mGy). EXAM: LUMBAR SPINE - 2-3 VIEW; DG C-ARM 1-60 MIN COMPARISON:  Radiographs of the lumbar spine 02/05/2017. FINDINGS: AP and  lateral view intraoperative fluoroscopic images of the lumbosacral spine are submitted, 2 images total. For the purposes of this dictation, the lowest well-formed intervertebral disc space is designated L5-S1. There is a posterior spinal fusion construct at L4-L5 (bilateral pedicle screws and vertical interconnecting rods). An interbody device is also present at L4-L5. Multiple superimposed staples are presumed overlying the patient. IMPRESSION: Two intraoperative fluoroscopic images the lumbosacral spine from L4-L5 fusion, as described. Electronically Signed   By: Jackey Loge DO   On: 09/21/2020 13:30   DG C-Arm 1-60 Min  Result Date: 09/21/2020 CLINICAL DATA:  Surgery, elective. Additional history provided: L4-5 OLIF with pedicle screws. Provided fluoroscopy time 5 minutes, 19 seconds (229.84 mGy). EXAM: LUMBAR SPINE - 2-3 VIEW; DG C-ARM 1-60 MIN COMPARISON:  Radiographs of the lumbar spine 02/05/2017. FINDINGS: AP and lateral view intraoperative fluoroscopic images of the lumbosacral spine are submitted, 2 images total. For the purposes of this dictation, the lowest well-formed intervertebral disc space is designated L5-S1. There is a  posterior spinal fusion construct at L4-L5 (bilateral pedicle screws and vertical interconnecting rods). An interbody device is also present at L4-L5. Multiple superimposed staples are presumed overlying the patient. IMPRESSION: Two intraoperative fluoroscopic images the lumbosacral spine from L4-L5 fusion, as described. Electronically Signed   By: Jackey Loge DO   On: 09/21/2020 13:30   VAS [place] LOWER EXTREMITY VENOUS (DVT)  Result Date: 09/22/2020  Lower Venous DVT Study Indications: Post-op ALIF.  Risk Factors: Surgery Oblique lumbar interbody fusion L4-5. Comparison Study: No prior studies. Performing Technologist: Jean Rosenthal RDMS,RVT  Examination Guidelines: A complete evaluation includes B-mode imaging, spectral Doppler, color Doppler, and power Doppler as needed of  all accessible portions of each vessel. Bilateral testing is considered an integral part of a complete examination. Limited examinations for reoccurring indications may be performed as noted. The reflux portion of the exam is performed with the patient in reverse Trendelenburg.  +---------+---------------+---------+-----------+----------+--------------+ RIGHT    CompressibilityPhasicitySpontaneityPropertiesThrombus Aging +---------+---------------+---------+-----------+----------+--------------+ CFV      Full           Yes      Yes                                 +---------+---------------+---------+-----------+----------+--------------+ SFJ      Full                                                        +---------+---------------+---------+-----------+----------+--------------+ FV Prox  Full                                                        +---------+---------------+---------+-----------+----------+--------------+ FV Mid   Full                                                        +---------+---------------+---------+-----------+----------+--------------+ FV DistalFull                                                        +---------+---------------+---------+-----------+----------+--------------+ PFV      Full                                                        +---------+---------------+---------+-----------+----------+--------------+ POP      Full           Yes      Yes                                 +---------+---------------+---------+-----------+----------+--------------+ PTV      Full                                                        +---------+---------------+---------+-----------+----------+--------------+  PERO     Full                                                        +---------+---------------+---------+-----------+----------+--------------+    +---------+---------------+---------+-----------+----------+--------------+ LEFT     CompressibilityPhasicitySpontaneityPropertiesThrombus Aging +---------+---------------+---------+-----------+----------+--------------+ CFV      Full           Yes      Yes                                 +---------+---------------+---------+-----------+----------+--------------+ SFJ      Full                                                        +---------+---------------+---------+-----------+----------+--------------+ FV Prox  Full                                                        +---------+---------------+---------+-----------+----------+--------------+ FV Mid   Full                                                        +---------+---------------+---------+-----------+----------+--------------+ FV DistalFull                                                        +---------+---------------+---------+-----------+----------+--------------+ PFV      Full                                                        +---------+---------------+---------+-----------+----------+--------------+ POP      Full           Yes      Yes                                 +---------+---------------+---------+-----------+----------+--------------+ PTV      Full                                                        +---------+---------------+---------+-----------+----------+--------------+ PERO     Full                                                        +---------+---------------+---------+-----------+----------+--------------+  Summary: RIGHT: - There is no evidence of deep vein thrombosis in the lower extremity.  - No cystic structure found in the popliteal fossa.  LEFT: - There is no evidence of deep vein thrombosis in the lower extremity.  - No cystic structure found in the popliteal fossa.  *See table(s) above for measurements and observations. Electronically signed  by Heath Larkhomas Hawken on 09/22/2020 at 6:59:13 PM.    Final    DG OR LOCAL ABDOMEN  Result Date: 09/21/2020 CLINICAL DATA:  Possible retained foreign body. EXAM: OR LOCAL ABDOMEN COMPARISON:  None. FINDINGS: Right lateral decubitus view was obtained of the abdomen. Status post surgical fusion of L4-5. Surgical staples are seen adjacent to the lumbar spine as well as overlying both iliac wings. No abnormal bowel dilatation is noted. No other radiopaque foreign body is noted. IMPRESSION: Status post surgical fusion of L4-5. Surgical staples are seen adjacent to lumbar spine and in the pelvis. No other radiopaque foreign body is noted. These results were called by telephone at the time of interpretation on 09/21/2020 at 11:14 am to provider [place] in OR 4, who verbally acknowledged these results. Electronically Signed   By: Lupita RaiderJames  Green Jr M.D.   On: 09/21/2020 11:14    Discharge Instructions    Incentive spirometry RT   Complete by: As directed        Follow-up Information    Venita LickBrooks, Dahari, MD. Schedule an appointment as soon as possible for a visit in 2 weeks.   Specialty: Orthopedic Surgery Why: If symptoms worsen, For suture removal, For wound re-check Contact information: [address] [place] [address] [phone]               Discharge Plan:  discharge to home  Disposition: stable    Signed: Leonette MonarchAmanda  N Ward for [facility] Ward PA-C [facility] [phone] 09/25/2020, 8:56 AM

## 2020-10-03 ENCOUNTER — Ambulatory Visit (HOSPITAL_COMMUNITY)
Admission: RE | Admit: 2020-10-03 | Discharge: 2020-10-03 | Disposition: A | Discharge status: Home / Self Care | Payer: Managed Care, Other (non HMO) | Source: Ambulatory Visit | Attending: Cardiovascular Disease | Admitting: Cardiovascular Disease

## 2020-10-03 ENCOUNTER — Other Ambulatory Visit (HOSPITAL_COMMUNITY): Payer: Self-pay

## 2020-10-03 ENCOUNTER — Other Ambulatory Visit (HOSPITAL_COMMUNITY): Payer: Self-pay | Admitting: Orthopedic Surgery

## 2020-10-03 ENCOUNTER — Other Ambulatory Visit: Payer: Self-pay

## 2020-10-03 DIAGNOSIS — M79661 Pain in right lower leg: Secondary | ICD-10-CM

## 2020-10-03 DIAGNOSIS — M79662 Pain in left lower leg: Secondary | ICD-10-CM

## 2020-10-03 DIAGNOSIS — M7989 Other specified soft tissue disorders: Secondary | ICD-10-CM | POA: Diagnosis not present

## 2020-11-07 ENCOUNTER — Ambulatory Visit: Payer: BC Managed Care – PPO | Admitting: Neurology

## 2020-12-12 ENCOUNTER — Other Ambulatory Visit: Payer: Self-pay | Admitting: Orthopedic Surgery

## 2020-12-12 ENCOUNTER — Encounter: Payer: Self-pay | Admitting: Family Medicine

## 2020-12-12 DIAGNOSIS — M259 Joint disorder, unspecified: Secondary | ICD-10-CM

## 2020-12-21 ENCOUNTER — Encounter: Payer: Self-pay | Admitting: Family Medicine

## 2020-12-28 ENCOUNTER — Other Ambulatory Visit: Payer: Self-pay

## 2020-12-28 ENCOUNTER — Ambulatory Visit
Admission: RE | Admit: 2020-12-28 | Discharge: 2020-12-28 | Disposition: A | Discharge status: Home / Self Care | Payer: Managed Care, Other (non HMO) | Source: Ambulatory Visit | Attending: Orthopedic Surgery | Admitting: Orthopedic Surgery

## 2020-12-28 DIAGNOSIS — M259 Joint disorder, unspecified: Secondary | ICD-10-CM

## 2020-12-28 MED ORDER — SUMATRIPTAN SUCCINATE 100 MG PO TABS: 100.0000 mg | ORAL_TABLET | ORAL | 1 refills | Status: DC | PRN

## 2021-01-05 ENCOUNTER — Encounter: Payer: Self-pay | Admitting: Family Medicine

## 2021-01-16 ENCOUNTER — Telehealth: Payer: Self-pay | Admitting: Neurology

## 2021-01-16 NOTE — Telephone Encounter (Signed)
Insurance changed and has not updated yet. She cant get her aimovig filled yet. She was wondering if  she could get a sample if we had any 140mg .

## 2021-01-16 NOTE — Telephone Encounter (Signed)
I have called patient. She stated that her old insurance is no longer active. Her new insurance will not be active until the next month.  Patient asked if she can get a sample in the meantime.

## 2021-01-16 NOTE — Telephone Encounter (Addendum)
Spoken to patient and notified that Dr Everlena Cooper gave the okay to give Aimovig 140 mg sample for patient. Patient's husband will come by to pick for patient.

## 2021-01-17 NOTE — Telephone Encounter (Signed)
Patient 's husband came to pick up Aimovig 140 mg  Gave 1 box of Aimovig 140 mg  Lot # [id number]   Exp Date 11/28/21  NDC 22025-427-06

## 2021-01-23 ENCOUNTER — Encounter: Payer: Self-pay | Admitting: Family Medicine

## 2021-01-30 NOTE — Progress Notes (Signed)
Virtual Visit via Video Note The purpose of this virtual visit is to provide medical care while limiting exposure to the novel coronavirus.    Consent was obtained for video visit:  Yes.   Answered questions that patient had about telehealth interaction:  Yes.   I discussed the limitations, risks, security and privacy concerns of performing an evaluation and management service by telemedicine. I also discussed with the patient that there may be a patient responsible charge related to this service. The patient expressed understanding and agreed to proceed.  Pt location: Home Physician Location: office Name of referring provider:  Natalia Leatherwood, DO I connected with [name] at patients initiation/request on 01/31/2021 at  8:30 AM EDT by video enabled telemedicine application and verified that I am speaking with the correct person using two identifiers. Pt MRN:  [id number] Pt DOB:  [birth year] Video Participants:  [name]  Assessment and Plan:    Menstrual migraines 2.    Migraine with aura, without status migrainosus, not intractable  Migraine prevention:  Aimovig 140mg  Q4wks Migraine rescue:  sumatriptan 100mg  Limit use of pain relievers to no more than 2 days out of week to prevent risk of rebound or medication-overuse headache. Keep headache diary Follow up 9 months   History of Present Illness:  Sheila Mathews is a 45 year old right-handed woman history of migraines, panic attacks, anxiety and allergic rhinitis who follows up for migraines.   UPDATE: Increased Aimovig to 140mg  in January. She tried Nurtec which was ineffective.  Intensity:  2-3/10 but may have 10/10 once a month Duration:  1 to 2 hours.   Frequency:  5 in 30 days Current NSAIDS:  none Current analgesics:  none Current triptans:  Sumatriptan 100mg  Current ergotamine:  none Current anti-emetic:  none Current muscle relaxants:  Robaxin Current anti-anxiolytic:  Klonopin Current sleep aide:   none Current Antihypertensive medications:  none Current Antidepressant/antipsychotic/mood medications:  Sertraline 150mg , Lithium Current Anticonvulsant medications:  lamotrigine 100mg  daily Current anti-CGRP:  Aimovig 140mg  Current Vitamins/Herbal/Supplements:  none Current Antihistamines/Decongestants:  Flonase Other therapy:  none Hormone/birth control:  none   Caffeine:  1 cup of coffee per day Alcohol:  Occasionally Smoker:  No Diet:  8 healthy. Drinks a lot of water. Exercise:  Good Depression/stress:  Anxiety controlled Other pain:  Back pain - plan for surgery. Sleep hygiene:  Wakes up during the night and difficulty falling back asleep   HISTORY:  Onset:  45 years old Location:  Usually right-sided, retro-orbital, and moves to the top of the head Quality:  Usually pressure-like, sometimes stabbing Initial Intensity:  Usually 6-7/10, 10 out of 10 for severe Aura:  Sometimes preceded by fuzzy and tunnel vision - when closes eyes she sees flashes of light.  Prodrome:  No Associated symptoms:  First severe, experiences nausea, blurred vision, photophobia, phonophobia, and osmophobia. Initial Duration:  Severe attacks last up to 4 days. Otherwise 2-3 hours Initial Frequency:  Severe attacks occur once a month (4 days per month), otherwise other headaches occur 2 days per week. Total of 15-18 headache days per month. Triggers/exacerbating factors:  Menstrual cycle Relieving factors:  Phenergan/Toradol shots Activity:  Cannot function with severe attacks   For years, she had headaches occurring 2 days a week, lasting 2-3 hours, but had severe migraines once a month lasting 4 days.  In 2019, they started occurring only twice a month but still lasting 4 days..   Past NSAIDS:  Ketorolac, naproxen Past analgesics:  Excedrin, Extra-strength Tylenol Past abortive triptans:  Relpax 40mg , Maxalt 10mg , sumatriptan injection Past abortive ergotamine:  none Past muscle relaxants:   Flexeril Past anti-emetic:  Zofran 4mg  Past antihypertensive medications:  No beta blockers as runs low HR Past antidepressant medications:  amitriptline 10mg  (side effects), citalopram Past anticonvulsant medications:  Depakote, topiramate, gabapentin (caused brain fog) Past anti-CGRP:  Ubrelvy 100mg  (ineffective), Nurtec (rescue- ineffective) Other past therapies:  non     Family history of headache:  No  Past Medical History: Past Medical History:  Diagnosis Date   Abnormal cervical Papanicolaou smear 04/20/2020   Allergies    Anxiety    Bipolar disorder (HCC)    Chicken pox    Chronic sinusitis 07/26/2016   Complication of anesthesia    pt doesn't like mask she has panic attack; childhood   Depression    Family history of adverse reaction to anesthesia    Mother PONV   GERD (gastroesophageal reflux disease)    History of anemia    during pregnancy   Migraines    Pelvic pain in female 05/13/2014   Pneumonia    PONV (postoperative nausea and vomiting)    no issues with short procdures, had one isntance of vomitus following a longer procedure    Sciatica    Thyroid disease     Medications: Outpatient Encounter Medications as of 01/31/2021  Medication Sig   clonazePAM (KLONOPIN) 0.5 MG tablet Take 0.5 mg by mouth 3 (three) times daily as needed for anxiety.   enoxaparin (LOVENOX) 40 MG/0.4ML injection Inject 0.4 mLs (40 mg total) into the skin daily for 10 days. 10 day supply 1 injection per day   Erenumab-aooe (AIMOVIG) 140 MG/ML SOAJ Inject 140 mg into the skin every 28 (twenty-eight) days.   lamoTRIgine (LAMICTAL) 200 MG tablet Take 200 mg by mouth daily.   levothyroxine (SYNTHROID) 25 MCG tablet Take 1 tablet (25 mcg total) by mouth daily.   lithium carbonate (LITHOBID) 300 MG CR tablet Take 900 mg by mouth daily.   ondansetron (ZOFRAN) 4 MG tablet Take 1 tablet (4 mg total) by mouth every 8 (eight) hours as needed for nausea or vomiting.   Plecanatide (TRULANCE) 3 MG  TABS Take 3 mg by mouth daily.   sertraline (ZOLOFT) 100 MG tablet Take 150 mg by mouth daily.   SUMAtriptan (IMITREX) 100 MG tablet Take 1 tablet (100 mg total) by mouth every 2 (two) hours as needed for migraine.   zolpidem (AMBIEN) 10 MG tablet Take 10 mg by mouth at bedtime as needed for sleep.   No facility-administered encounter medications on file as of 01/31/2021.    Allergies: Allergies  Allergen Reactions   Almond (Diagnostic) Anaphylaxis   Codeine Other (See Comments)    Makes patient aggressive     Hydrocodone Other (See Comments)    Makes patient aggressive     Family History: Family History  Problem Relation Age of Onset   Cancer Maternal Grandmother        breast    Breast cancer Maternal Grandmother    Cancer Maternal Grandfather        head neck    Diabetes Maternal Grandfather    Cancer Paternal Grandmother        gallbladder   Cancer Paternal Grandfather        colon    Diabetes Paternal Grandfather    Depression Mother    Mental illness Mother    Diabetes Brother    Drug abuse Brother  Early death Brother    Mental illness Brother     Observations/Objective:   There were no vitals taken for this visit. No acute distress.  Alert and oriented.  Speech fluent and not dysarthric.  Language intact.     Follow Up Instructions:    -I discussed the assessment and treatment plan with the patient. The patient was provided an opportunity to ask questions and all were answered. The patient agreed with the plan and demonstrated an understanding of the instructions.   The patient was advised to call back or seek an in-person evaluation if the symptoms worsen or if the condition fails to improve as anticipated.  Cira Servant, DO

## 2021-01-31 ENCOUNTER — Other Ambulatory Visit: Payer: Self-pay

## 2021-01-31 ENCOUNTER — Telehealth (INDEPENDENT_AMBULATORY_CARE_PROVIDER_SITE_OTHER): Payer: BC Managed Care – PPO | Admitting: Neurology

## 2021-01-31 ENCOUNTER — Encounter: Payer: Self-pay | Admitting: Neurology

## 2021-01-31 DIAGNOSIS — G43109 Migraine with aura, not intractable, without status migrainosus: Secondary | ICD-10-CM | POA: Diagnosis not present

## 2021-01-31 DIAGNOSIS — G43829 Menstrual migraine, not intractable, without status migrainosus: Secondary | ICD-10-CM | POA: Diagnosis not present

## 2021-01-31 MED ORDER — AIMOVIG 140 MG/ML ~~LOC~~ SOAJ: 140.0000 mg | SUBCUTANEOUS | 5 refills | Status: DC

## 2021-01-31 MED ORDER — SUMATRIPTAN SUCCINATE 100 MG PO TABS: ORAL_TABLET | ORAL | 5 refills | Status: DC

## 2021-02-05 ENCOUNTER — Ambulatory Visit (INDEPENDENT_AMBULATORY_CARE_PROVIDER_SITE_OTHER): Payer: BC Managed Care – PPO | Admitting: Family Medicine

## 2021-02-05 ENCOUNTER — Encounter: Payer: Self-pay | Admitting: Family Medicine

## 2021-02-05 ENCOUNTER — Other Ambulatory Visit: Payer: Self-pay

## 2021-02-05 VITALS — BP 96/65 | HR 59 | Temp 98.2°F | Ht 64.0 in | Wt 211.0 lb

## 2021-02-05 DIAGNOSIS — K219 Gastro-esophageal reflux disease without esophagitis: Secondary | ICD-10-CM

## 2021-02-05 DIAGNOSIS — E782 Mixed hyperlipidemia: Secondary | ICD-10-CM | POA: Diagnosis not present

## 2021-02-05 DIAGNOSIS — Z131 Encounter for screening for diabetes mellitus: Secondary | ICD-10-CM | POA: Diagnosis not present

## 2021-02-05 DIAGNOSIS — Z0000 Encounter for general adult medical examination without abnormal findings: Secondary | ICD-10-CM

## 2021-02-05 DIAGNOSIS — F429 Obsessive-compulsive disorder, unspecified: Secondary | ICD-10-CM

## 2021-02-05 DIAGNOSIS — Z79899 Other long term (current) drug therapy: Secondary | ICD-10-CM | POA: Diagnosis not present

## 2021-02-05 DIAGNOSIS — Z6836 Body mass index (BMI) 36.0-36.9, adult: Secondary | ICD-10-CM

## 2021-02-05 DIAGNOSIS — Z1159 Encounter for screening for other viral diseases: Secondary | ICD-10-CM | POA: Diagnosis not present

## 2021-02-05 DIAGNOSIS — E039 Hypothyroidism, unspecified: Secondary | ICD-10-CM | POA: Diagnosis not present

## 2021-02-05 DIAGNOSIS — F319 Bipolar disorder, unspecified: Secondary | ICD-10-CM

## 2021-02-05 DIAGNOSIS — F4310 Post-traumatic stress disorder, unspecified: Secondary | ICD-10-CM

## 2021-02-05 DIAGNOSIS — E6609 Other obesity due to excess calories: Secondary | ICD-10-CM

## 2021-02-05 LAB — COMPREHENSIVE METABOLIC PANEL
ALT: 12 U/L (ref 0–35)
AST: 17 U/L (ref 0–37)
Albumin: 4.3 g/dL (ref 3.5–5.2)
Alkaline Phosphatase: 72 U/L (ref 39–117)
BUN: 9 mg/dL (ref 6–23)
CO2: 25 mEq/L (ref 19–32)
Calcium: 9.4 mg/dL (ref 8.4–10.5)
Chloride: 102 mEq/L (ref 96–112)
Creatinine, Ser: 0.68 mg/dL (ref 0.40–1.20)
GFR: 107.17 mL/min (ref >60.00)
Glucose, Bld: 82 mg/dL (ref 70–99)
Potassium: 4.2 mEq/L (ref 3.5–5.1)
Sodium: 136 mEq/L (ref 135–145)
Total Bilirubin: 0.6 mg/dL (ref 0.2–1.2)
Total Protein: 7.1 g/dL (ref 6.0–8.3)

## 2021-02-05 LAB — HEMOGLOBIN A1C: Hgb A1c MFr Bld: 5.4 % (ref 4.6–6.5)

## 2021-02-05 LAB — CBC WITH DIFFERENTIAL/PLATELET
Basophils Absolute: 0.1 10*3/uL (ref 0.0–0.1)
Basophils Relative: 1 % (ref 0.0–3.0)
Eosinophils Absolute: 0.3 10*3/uL (ref 0.0–0.7)
Eosinophils Relative: 3.6 % (ref 0.0–5.0)
HCT: 35.3 % — ABNORMAL LOW (ref 36.0–46.0)
Hemoglobin: 11.2 g/dL — ABNORMAL LOW (ref 12.0–15.0)
Lymphocytes Relative: 17.5 % (ref 12.0–46.0)
Lymphs Abs: 1.5 10*3/uL (ref 0.7–4.0)
MCHC: 31.7 g/dL (ref 30.0–36.0)
MCV: 82.6 fl (ref 78.0–100.0)
Monocytes Absolute: 0.6 10*3/uL (ref 0.1–1.0)
Monocytes Relative: 7.6 % (ref 3.0–12.0)
Neutro Abs: 5.9 10*3/uL (ref 1.4–7.7)
Neutrophils Relative %: 70.3 % (ref 43.0–77.0)
Platelets: 428 10*3/uL — ABNORMAL HIGH (ref 150.0–400.0)
RBC: 4.27 Mil/uL (ref 3.87–5.11)
RDW: 17.2 % — ABNORMAL HIGH (ref 11.5–15.5)
WBC: 8.5 10*3/uL (ref 4.0–10.5)

## 2021-02-05 LAB — LIPID PANEL
Cholesterol: 218 mg/dL — ABNORMAL HIGH (ref 0–200)
HDL: 47 mg/dL (ref >39.00)
LDL Cholesterol: 140 mg/dL — ABNORMAL HIGH (ref 0–99)
NonHDL: 171.4
Total CHOL/HDL Ratio: 5
Triglycerides: 156 mg/dL — ABNORMAL HIGH (ref 0.0–149.0)
VLDL: 31.2 mg/dL (ref 0.0–40.0)

## 2021-02-05 LAB — T4, FREE: Free T4: 0.74 ng/dL (ref 0.60–1.60)

## 2021-02-05 LAB — TSH: TSH: 3.67 u[IU]/mL (ref 0.35–5.50)

## 2021-02-05 NOTE — Progress Notes (Signed)
This visit occurred during the SARS-CoV-2 public health emergency.  Safety protocols were in place, including screening questions prior to the visit, additional usage of staff PPE, and extensive cleaning of exam room while observing appropriate contact time as indicated for disinfecting solutions.    Patient ID: Sheila Mathews, female  DOB: 22-Jan-1976, 45 y.o.   MRN: [id number] Patient Care Team    Relationship Specialty Notifications Start End  Ma Hillock, DO PCP - General Family Medicine  05/05/20   Everlene Farrier, MD Consulting Physician Obstetrics and Gynecology  02/05/21   Pieter Partridge, [place] Physician Neurology  02/05/21   Carol Ada, MD Consulting Physician Gastroenterology  02/05/21   Melina Schools, MD Consulting Physician Orthopedic Surgery  02/05/21     Chief Complaint  Patient presents with   Annual Exam    Pt is fasting     Subjective:  Sheila Mathews is a 45 y.o.  Female  present for CPE. All past medical history, surgical history, allergies, family history, immunizations, medications and social history were updated in the electronic medical record today. All recent labs, ED visits and hospitalizations within the last year were reviewed.  Health maintenance:  Colonoscopy: start screen at 89. Discussed cologuard. PGF did have colon cancer but after 80s.  Mammogram: completed:2019, > has appt with gyn 8/28 Cervical cancer screening: last pap: 12/2017 - normal- by PCP.   Immunizations: tdap utd 09/2016, Influenza UTD 2021 (encouraged yearly), Covid declined.  Infectious disease screening:Hep C completed today DEXA: routine screen  Assistive device: none Oxygen KGM:WNUU Patient has a Dental home. Hospitalizations/ED visits: reviewed  Long-term current use of lithium/Bipolar 1 disorder (HCC)/ PTSD (post-traumatic stress disorder)/Obsessive-compulsive disorder, unspecified type Patient is established with her psychiatry team who manages her medications.  She is  prescribed lithium and reports her last TSH at psychiatry 2 months ago was ~7.    Acquired hypothyroidism Patient reports compliance with levo 25 mcg. Her thyroid panel was normal here after start. However she states her tsh was checked about 2 months ago and was ~7.   Migraine without status migrainosus, not intractable, unspecified migraine type Patient is managed by neurology team for her migraines.     Depression screen PHQ 2/9 05/05/2020  Decreased Interest 1  Down, Depressed, Hopeless 1  PHQ - 2 Score 2  Altered sleeping 3  Tired, decreased energy 1  Change in appetite 3  Feeling bad or failure about yourself  1  Trouble concentrating 0  Moving slowly or fidgety/restless 0  Suicidal thoughts 0  PHQ-9 Score 10   GAD 7 : Generalized Anxiety Score 05/05/2020  Nervous, Anxious, on Edge 2  Control/stop worrying 0  Worry too much - different things 1  Trouble relaxing 1  Restless 1  Easily annoyed or irritable 0  Afraid - awful might happen 0  Total GAD 7 Score 5    Immunization History  Administered Date(s) Administered   Hepatitis B, adult 06/09/2006, 08/11/2006, 12/18/2006   Influenza Split 03/25/2015, 02/02/2016, 03/09/2017   Influenza,inj,Quad PF,6+ Mos 04/02/2019, 05/05/2020   Influenza,inj,quad, With Preservative 07/01/2016   MMR 06/09/2006   Td 07/21/2014, 10/17/2016   Tdap 06/09/2006, 07/21/2014, 08/25/2015     Past Medical History:  Diagnosis Date   Abnormal cervical Papanicolaou smear 04/20/2020   Allergies    Anxiety    Bipolar disorder ([place])    Chicken pox    Chronic sinusitis 01/23/3663   Complication of anesthesia    pt doesn't like mask  she has panic attack; childhood   Depression    Family history of adverse reaction to anesthesia    Mother PONV   GERD (gastroesophageal reflux disease)    History of anemia    during pregnancy   Migraines    Pelvic pain in female 05/13/2014   Pneumonia    PONV (postoperative nausea and vomiting)    no  issues with short procdures, had one isntance of vomitus following a longer procedure    Sciatica    Thyroid disease    Allergies  Allergen Reactions   Almond (Diagnostic) Anaphylaxis   Codeine Other (See Comments)    Makes patient aggressive     Hydrocodone Other (See Comments)    Makes patient aggressive    Past Surgical History:  Procedure Laterality Date   ABDOMINAL EXPOSURE N/A 09/21/2020   Procedure: LATERAL EXPOSURE FOR OBLIQUE LATERAL SPINE SURGERY;  Surgeon: Marty Heck, MD;  Location: [facility];  Service: Vascular;  Laterality: N/A;   ANTERIOR LATERAL LUMBAR FUSION WITH PERCUTANEOUS SCREW 1 LEVEL N/A 09/21/2020   Procedure: OBLIQUE LUMBAR INTERBODY FUSION LUMBAR FOUR THROUGH FIVE, POSTERIOR SPINAL FUSION INSTRUMENTATION [place];  Surgeon: Melina Schools, MD;  Location: [facility];  Service: Orthopedics;  Laterality: N/A;   CESAREAN SECTION  x2   2000, 2005   LAPAROSCOPY N/A 05/13/2014   Procedure: LAPAROSCOPY DIAGNOSTIC;  Surgeon: Cheri Fowler, MD;  Location: [facility];  Service: Gynecology;  Laterality: N/A;   LUMBAR LAMINECTOMY/DECOMPRESSION MICRODISCECTOMY Left 02/12/2017   Procedure: Microlumbar decompression L5-S1 left ;  Surgeon: Susa Day, MD;  Location: [facility];  Service: Orthopedics;  Laterality: Left;  90 mins   SALIVARY GLAND SURGERY  1986   UPPER GI ENDOSCOPY     WISDOM TOOTH EXTRACTION     Family History  Problem Relation Age of Onset   Depression Mother    Mental illness Mother    Diabetes Brother    Drug abuse Brother    Early death Brother    Mental illness Brother    Breast cancer Maternal Grandmother 7   Cancer Maternal Grandfather        head neck    Diabetes Maternal Grandfather    Cancer Paternal Grandmother        gallbladder   Colon cancer Paternal Grandfather 5   Diabetes Paternal Grandfather    Social History   Social History Narrative   Marital status/children/pets: Married, 3 children.    Education/employment:  Bachelor's degree, healthcare advocate   Safety:      -smoke alarm in the home:Yes     - wears seatbelt: Yes     - Feels safe in their relationships: Yes   Right handed   No caffeine   One story home    Allergies as of 02/05/2021       Reactions   Almond (diagnostic) Anaphylaxis   Codeine Other (See Comments)   Makes patient aggressive    Hydrocodone Other (See Comments)   Makes patient aggressive         Medication List        Accurate as of February 05, 2021  2:10 PM. If you have any questions, ask your nurse or doctor.          STOP taking these medications    Amitiza 24 MCG capsule Generic drug: lubiprostone Stopped by: Howard Pouch, DO   cyclobenzaprine 10 MG tablet Commonly known as: FLEXERIL Stopped by: Howard Pouch, DO   enoxaparin 40 MG/0.4ML injection  Commonly known as: LOVENOX Stopped by: Howard Pouch, DO   gabapentin 300 MG capsule Commonly known as: NEURONTIN Stopped by: Howard Pouch, DO   methocarbamol 500 MG tablet Commonly known as: ROBAXIN Stopped by: Howard Pouch, DO   ondansetron 4 MG tablet Commonly known as: Zofran Stopped by: Howard Pouch, DO   pantoprazole 40 MG tablet Commonly known as: PROTONIX Stopped by: Howard Pouch, DO       TAKE these medications    Aimovig 140 MG/ML Soaj Generic drug: Erenumab-aooe Inject 140 mg into the skin every 28 (twenty-eight) days.   ARIPiprazole 5 MG tablet Commonly known as: ABILIFY Take 5 mg by mouth daily.   azelastine 0.1 % nasal spray Commonly known as: ASTELIN Place 1 spray into both nostrils 2 (two) times daily.   clonazePAM 0.5 MG tablet Commonly known as: KLONOPIN Take 0.5 mg by mouth 3 (three) times daily as needed for anxiety.   lamoTRIgine 200 MG tablet Commonly known as: LAMICTAL Take 200 mg by mouth daily.   levothyroxine 25 MCG tablet Commonly known as: SYNTHROID Take 1 tablet (25 mcg total) by mouth daily.   lithium carbonate 300 MG CR tablet Commonly known  as: LITHOBID Take 900 mg by mouth daily.   oxyCODONE-acetaminophen 5-325 MG tablet Commonly known as: PERCOCET/ROXICET Take 1 tablet by mouth 3 (three) times daily as needed.   sertraline 100 MG tablet Commonly known as: ZOLOFT Take 150 mg by mouth daily.   SUMAtriptan 100 MG tablet Commonly known as: IMITREX Take 1 tablet earliest onset of migraine.  May repeat in 2 hours. Maximum 2 tablets in 24 hours.   Trulance 3 MG Tabs Generic drug: Plecanatide Take 3 mg by mouth daily.   zolpidem 10 MG tablet Commonly known as: AMBIEN Take 10 mg by mouth at bedtime as needed for sleep.        All past medical history, surgical history, allergies, family history, immunizations andmedications were updated in the EMR today and reviewed under the history and medication portions of their EMR.     No results found for this or any previous visit (from the past 2160 hour(s)).  CT BIOPSY  Result Date: 12/28/2020 IMPRESSION: Successful CT-guided left SI joint injection. Electronically Signed   By: Jacqulynn Cadet M.D.   On: 12/28/2020 15:05     ROS: 14 pt review of systems performed and negative (unless mentioned in an HPI)  Objective: BP 96/65   Pulse (!) 59   Temp 98.2 F (36.8 C) (Oral)   Ht _0  (1.626 m)   Wt 211 lb (95.7 kg)   SpO2 100%   BMI 36.22 kg/m  Gen: Afebrile. No acute distress. Nontoxic in appearance, well-developed, well-nourished,  very pleasant obese female.  HENT: AT. [place]. Bilateral TM visualized and normal in appearance, normal external auditory canal. MMM, no oral lesions, adequate dentition. Bilateral nares within normal limits. Throat without erythema, ulcerations or exudates. no Cough on exam, no hoarseness on exam. Eyes:Pupils Equal Round Reactive to light, Extraocular movements intact,  Conjunctiva without redness, discharge or icterus. Neck/lymp/endocrine: Supple,no lymphadenopathy, no thyromegaly CV: RRR no murmur, no edema, +2/4 P posterior tibialis  pulses.  Chest: CTAB, no wheeze, rhonchi or crackles. normal Respiratory effort. good Air movement. Abd: Soft. flat. NTND. BS present. No Masses palpated. No hepatosplenomegaly. No rebound tenderness or guarding. Skin: no rashes, purpura or petechiae. Warm and well-perfused. Skin intact. Neuro/Msk:  Normal gait. PERLA. EOMi. Alert. Oriented x3.  Cranial nerves II through XII intact. Muscle  strength 5/5 upper/lower extremity. DTRs equal bilaterally. Psych: Normal affect, dress and demeanor. Normal speech. Normal thought content and judgment.   No results found.  Assessment/plan: VANICE RAPPA is a 45 y.o. female present for CPE  Long-term current use of lithium/Bipolar 1 disorder (HCC)/ PTSD (post-traumatic stress disorder)/Obsessive-compulsive disorder, unspecified type Managed by psychiatry - Comprehensive metabolic panel collected today  Acquired hypothyroidism Continue levothyroxine 25 mcg daily. TSH, T4 free collected today. - lipid  Dose will be altered dependent upon todays results.  Migraine without status migrainosus, not intractable, unspecified migraine type Managed by neurology   Mixed hyperlipidemia/obesity Routine exercise and dietary changes.  - CBC with Differential/Platelet - Comprehensive metabolic panel - Lipid panel - TSH - T4, free Diabetes mellitus screening - Hemoglobin A1c Encounter for long-term current use of medication - Comprehensive metabolic panel Need for hepatitis C screening test - Hepatitis C antibody Routine general medical examination at a health care facility Colonoscopy: start screen at 61. Discussed cologuard. PGF did have colon cancer but after 80s.  Mammogram: completed:2019, > has appt with gyn 8/28 Cervical cancer screening: last pap: 12/2017 - normal- by PCP.   Immunizations: tdap utd 09/2016, Influenza UTD 2021 (encouraged yearly), Covid declined.  Infectious disease screening:Hep C completed today Patient was encouraged to  exercise greater than 150 minutes a week. Patient was encouraged to choose a diet filled with fresh fruits and vegetables, and lean meats. AVS provided to patient today for education/recommendation on gender specific health and safety maintenance. Return in about 1 year (around 02/05/2022) for CPE (30 min).    Orders Placed This Encounter  Procedures   CBC with Differential/Platelet   Comprehensive metabolic panel   Hemoglobin A1c   Lipid panel   TSH   T4, free   Hepatitis C antibody   No orders of the defined types were placed in this encounter.  Referral Orders  No referral(s) requested today     Electronically signed by: Howard Pouch, [place]

## 2021-02-05 NOTE — Patient Instructions (Signed)
Health Maintenance, Female Adopting a healthy lifestyle and getting preventive care are important in promoting health and wellness. Ask your health care provider about: The right schedule for you to have regular tests and exams. Things you can do on your own to prevent diseases and keep yourself healthy. What should I know about diet, weight, and exercise? Eat a healthy diet  Eat a diet that includes plenty of vegetables, fruits, low-fat dairy products, and lean protein. Do not eat a lot of foods that are high in solid fats, added sugars, or sodium.  Maintain a healthy weight Body mass index (BMI) is used to identify weight problems. It estimates body fat based on height and weight. Your health care provider can help determineyour BMI and help you achieve or maintain a healthy weight. Get regular exercise Get regular exercise. This is one of the most important things you can do for your health. Most adults should: Exercise for at least 150 minutes each week. The exercise should increase your heart rate and make you sweat (moderate-intensity exercise). Do strengthening exercises at least twice a week. This is in addition to the moderate-intensity exercise. Spend less time sitting. Even light physical activity can be beneficial. Watch cholesterol and blood lipids Have your blood tested for lipids and cholesterol at 43 years of age, then havethis test every 5 years. Have your cholesterol levels checked more often if: Your lipid or cholesterol levels are high. You are older than 43 years of age. You are at high risk for heart disease. What should I know about cancer screening? Depending on your health history and family history, you may need to have cancer screening at various ages. This may include screening for: Breast cancer. Cervical cancer. Colorectal cancer. Skin cancer. Lung cancer. What should I know about heart disease, diabetes, and high blood pressure? Blood pressure and heart  disease High blood pressure causes heart disease and increases the risk of stroke. This is more likely to develop in people who have high blood pressure readings, are of African descent, or are overweight. Have your blood pressure checked: Every 3-5 years if you are 18-39 years of age. Every year if you are 40 years old or older. Diabetes Have regular diabetes screenings. This checks your fasting blood sugar level. Have the screening done: Once every three years after age 40 if you are at a normal weight and have a low risk for diabetes. More often and at a younger age if you are overweight or have a high risk for diabetes. What should I know about preventing infection? Hepatitis B If you have a higher risk for hepatitis B, you should be screened for this virus. Talk with your health care provider to find out if you are at risk forhepatitis B infection. Hepatitis C Testing is recommended for: Everyone born from 1945 through 1965. Anyone with known risk factors for hepatitis C. Sexually transmitted infections (STIs) Get screened for STIs, including gonorrhea and chlamydia, if: You are sexually active and are younger than 43 years of age. You are older than 43 years of age and your health care provider tells you that you are at risk for this type of infection. Your sexual activity has changed since you were last screened, and you are at increased risk for chlamydia or gonorrhea. Ask your health care provider if you are at risk. Ask your health care provider about whether you are at high risk for HIV. Your health care provider may recommend a prescription medicine to help   prevent HIV infection. If you choose to take medicine to prevent HIV, you should first get tested for HIV. You should then be tested every 3 months for as long as you are taking the medicine. Pregnancy If you are about to stop having your period (premenopausal) and you may become pregnant, seek counseling before you get  pregnant. Take 400 to 800 micrograms (mcg) of folic acid every day if you become pregnant. Ask for birth control (contraception) if you want to prevent pregnancy. Osteoporosis and menopause Osteoporosis is a disease in which the bones lose minerals and strength with aging. This can result in bone fractures. If you are 65 years old or older, or if you are at risk for osteoporosis and fractures, ask your health care provider if you should: Be screened for bone loss. Take a calcium or vitamin D supplement to lower your risk of fractures. Be given hormone replacement therapy (HRT) to treat symptoms of menopause. Follow these instructions at home: Lifestyle Do not use any products that contain nicotine or tobacco, such as cigarettes, e-cigarettes, and chewing tobacco. If you need help quitting, ask your health care provider. Do not use street drugs. Do not share needles. Ask your health care provider for help if you need support or information about quitting drugs. Alcohol use Do not drink alcohol if: Your health care provider tells you not to drink. You are pregnant, may be pregnant, or are planning to become pregnant. If you drink alcohol: Limit how much you use to 0-1 drink a day. Limit intake if you are breastfeeding. Be aware of how much alcohol is in your drink. In the [place] one drink equals one 12 oz bottle of beer (355 mL), one 5 oz glass of wine (148 mL), or one 1 oz glass of hard liquor (44 mL). General instructions Schedule regular health, dental, and eye exams. Stay current with your vaccines. Tell your health care provider if: You often feel depressed. You have ever been abused or do not feel safe at home. Summary Adopting a healthy lifestyle and getting preventive care are important in promoting health and wellness. Follow your health care provider's instructions about healthy diet, exercising, and getting tested or screened for diseases. Follow your health care provider's  instructions on monitoring your cholesterol and blood pressure. This information is not intended to replace advice given to you by your health care provider. Make sure you discuss any questions you have with your healthcare provider. Document Revised: 06/10/2018 Document Reviewed: 06/10/2018 Elsevier Patient Education  2022 [employer]  

## 2021-02-06 ENCOUNTER — Other Ambulatory Visit: Payer: Self-pay | Admitting: Orthopedic Surgery

## 2021-02-06 ENCOUNTER — Telehealth: Payer: Self-pay | Admitting: Family Medicine

## 2021-02-06 DIAGNOSIS — G8929 Other chronic pain: Secondary | ICD-10-CM

## 2021-02-06 DIAGNOSIS — M533 Sacrococcygeal disorders, not elsewhere classified: Secondary | ICD-10-CM

## 2021-02-06 LAB — HEPATITIS C ANTIBODY
Hepatitis C Ab: NONREACTIVE
SIGNAL TO CUT-OFF: 0.02 (ref <1.00)

## 2021-02-06 MED ORDER — LEVOTHYROXINE SODIUM 25 MCG PO TABS: ORAL_TABLET | ORAL | 3 refills | Status: DC

## 2021-02-06 NOTE — Telephone Encounter (Signed)
Spoke with patient regarding results/recommendations,voiced understanding.  

## 2021-02-06 NOTE — Telephone Encounter (Signed)
Please call patient Liver kidney function function are normal Blood cell counts and electrolytes are normal Diabetes screening/A1c is normal  Cholesterol panel is good.  With an LDL of 140. Her thyroid is in normal range at 3.67.  I have increased her dose just mildly to give her a little bit more coverage and get her closer to 2.0 range.  She will continue with 25 mcg daily for 6 days a week and on 1 day a week she will take 2 tabs.    Hep C is still pending, we will send her notification via MyChart or call her if abnormal.

## 2021-02-12 DIAGNOSIS — F4310 Post-traumatic stress disorder, unspecified: Secondary | ICD-10-CM | POA: Diagnosis not present

## 2021-02-12 DIAGNOSIS — F429 Obsessive-compulsive disorder, unspecified: Secondary | ICD-10-CM | POA: Diagnosis not present

## 2021-02-12 DIAGNOSIS — F3189 Other bipolar disorder: Secondary | ICD-10-CM | POA: Diagnosis not present

## 2021-02-15 ENCOUNTER — Telehealth (INDEPENDENT_AMBULATORY_CARE_PROVIDER_SITE_OTHER): Payer: BC Managed Care – PPO | Admitting: Family Medicine

## 2021-02-15 ENCOUNTER — Encounter: Payer: Self-pay | Admitting: Family Medicine

## 2021-02-15 VITALS — Temp 101.4°F | Wt 211.0 lb

## 2021-02-15 DIAGNOSIS — R509 Fever, unspecified: Secondary | ICD-10-CM | POA: Diagnosis not present

## 2021-02-15 DIAGNOSIS — J069 Acute upper respiratory infection, unspecified: Secondary | ICD-10-CM | POA: Diagnosis not present

## 2021-02-15 DIAGNOSIS — J989 Respiratory disorder, unspecified: Secondary | ICD-10-CM | POA: Diagnosis not present

## 2021-02-15 DIAGNOSIS — J0101 Acute recurrent maxillary sinusitis: Secondary | ICD-10-CM

## 2021-02-15 MED ORDER — AMOXICILLIN 875 MG PO TABS: 875.0000 mg | ORAL_TABLET | Freq: Two times a day (BID) | ORAL | 0 refills | Status: AC

## 2021-02-15 MED ORDER — BENZONATATE 200 MG PO CAPS: 200.0000 mg | ORAL_CAPSULE | Freq: Three times a day (TID) | ORAL | 0 refills | Status: DC | PRN

## 2021-02-15 NOTE — Progress Notes (Signed)
cVirtual Visit via Video Note  I connected with pt on 02/15/21 at  4:00 PM EDT by a video enabled telemedicine application and verified that I am speaking with the correct person using two identifiers.  Location patient: home, [place] Location provider:work or home office Persons participating in the virtual visit: patient, provider  I discussed the limitations of evaluation and management by telemedicine and the availability of in person appointments. The patient expressed understanding and agreed to proceed.  Telemedicine visit is a necessity given the COVID-19 restrictions in place at the current time.  HPI: 45 y/o WF being seen today for respiratory concerns. Onset 2 d/a, gradually worsening nasal cong, runny nose, cough.  +PND, +maxillary face pain bilat.   T around 100 since onset, T 101.4 an hour ago.  Taking dayquil. Her husband recently had similar illness.  He tested neg for covid. No hx of asthma.  No SOB. No covid test done.  ROS: See pertinent positives and negatives per HPI.  Past Medical History:  Diagnosis Date   Abnormal cervical Papanicolaou smear 04/20/2020   Allergies    Anxiety    Bipolar disorder (HCC)    Chicken pox    Chronic sinusitis 07/26/2016   Complication of anesthesia    pt doesn't like mask she has panic attack; childhood   Depression    Family history of adverse reaction to anesthesia    Mother PONV   GERD (gastroesophageal reflux disease)    History of anemia    during pregnancy   Migraines    Pelvic pain in female 05/13/2014   Pneumonia    PONV (postoperative nausea and vomiting)    no issues with short procdures, had one isntance of vomitus following a longer procedure    Sciatica    Thyroid disease     Past Surgical History:  Procedure Laterality Date   ABDOMINAL EXPOSURE N/A 09/21/2020   Procedure: LATERAL EXPOSURE FOR OBLIQUE LATERAL SPINE SURGERY;  Surgeon: Cephus Shelling, MD;  Location: [facility];  Service: Vascular;  Laterality:  N/A;   ANTERIOR LATERAL LUMBAR FUSION WITH PERCUTANEOUS SCREW 1 LEVEL N/A 09/21/2020   Procedure: OBLIQUE LUMBAR INTERBODY FUSION LUMBAR FOUR THROUGH FIVE, POSTERIOR SPINAL FUSION INSTRUMENTATION LUMAR FOUR THROUGH FIVE;  Surgeon: Venita Lick, MD;  Location: [facility];  Service: Orthopedics;  Laterality: N/A;   CESAREAN SECTION  x2   2000, 2005   LAPAROSCOPY N/A 05/13/2014   Procedure: LAPAROSCOPY DIAGNOSTIC;  Surgeon: Lavina Hamman, MD;  Location: [facility];  Service: Gynecology;  Laterality: N/A;   LUMBAR LAMINECTOMY/DECOMPRESSION MICRODISCECTOMY Left 02/12/2017   Procedure: Microlumbar decompression L5-S1 left ;  Surgeon: Jene Every, MD;  Location: [facility];  Service: Orthopedics;  Laterality: Left;  90 mins   SALIVARY GLAND SURGERY  1986   UPPER GI ENDOSCOPY     WISDOM TOOTH EXTRACTION       Current Outpatient Medications:    ARIPiprazole (ABILIFY) 5 MG tablet, Take 5 mg by mouth daily., Disp: , Rfl:    azelastine (ASTELIN) 0.1 % nasal spray, Place 1 spray into both nostrils 2 (two) times daily., Disp: , Rfl:    clonazePAM (KLONOPIN) 0.5 MG tablet, Take 0.5 mg by mouth 3 (three) times daily as needed for anxiety., Disp: , Rfl:    Erenumab-aooe (AIMOVIG) 140 MG/ML SOAJ, Inject 140 mg into the skin every 28 (twenty-eight) days., Disp: 1.12 mL, Rfl: 5   lamoTRIgine (LAMICTAL) 200 MG tablet, Take 200 mg by mouth daily., Disp: , Rfl:  levothyroxine (SYNTHROID) 25 MCG tablet, 1 tab daily p.o. on an empty stomach x6 days a week, 2 tabs p.o. 1 day a week., Disp: 102 tablet, Rfl: 3   lithium carbonate (LITHOBID) 300 MG CR tablet, Take 900 mg by mouth daily., Disp: , Rfl:    sertraline (ZOLOFT) 100 MG tablet, Take 150 mg by mouth daily., Disp: , Rfl:    SUMAtriptan (IMITREX) 100 MG tablet, Take 1 tablet earliest onset of migraine.  May repeat in 2 hours. Maximum 2 tablets in 24 hours., Disp: 10 tablet, Rfl: 5   zolpidem (AMBIEN) 10 MG tablet, Take 10 mg by mouth at bedtime as needed for sleep.,  Disp: , Rfl:   EXAM:  VITALS per patient if applicable:  Vitals with BMI 02/15/2021 02/05/2021 09/22/2020  Height - 5\' 4"  -  Weight 211 lbs 211 lbs -  BMI 36.2 36.2 -  Systolic - 96 106  Diastolic - 65 66  Pulse - 59 73    GENERAL: alert, oriented, appears tired, in no acute distress Mild generalized facial fullness. She is sniffing quite a bit, also some rattly cough.  HEENT: atraumatic, conjunttiva clear, no obvious abnormalities on inspection of external nose and ears  NECK: normal movements of the head and neck  LUNGS: on inspection no signs of respiratory distress, breathing rate appears normal, no obvious gross SOB, gasping or wheezing  CV: no obvious cyanosis  MS: moves all visible extremities without noticeable abnormality  PSYCH/NEURO: pleasant and cooperative, no obvious depression or anxiety, speech and thought processing grossly intact  LAB: none today    Chemistry      Component Value Date/Time   NA 136 02/05/2021 1109   K 4.2 02/05/2021 1109   CL 102 02/05/2021 1109   CO2 25 02/05/2021 1109   BUN 9 02/05/2021 1109   CREATININE 0.68 02/05/2021 1109      Component Value Date/Time   CALCIUM 9.4 02/05/2021 1109   ALKPHOS 72 02/05/2021 1109   AST 17 02/05/2021 1109   ALT 12 02/05/2021 1109   BILITOT 0.6 02/05/2021 1109      ASSESSMENT AND PLAN:  Discussed the following assessment and plan:  Febril resp infection with cough.  Some features suggestive of bacterial sinusitis (pt with hx of chronic sinusitis). Certainly covid very possible.  She declined to come up and get a covid test here but said she may have her husband get her a home test and she'll call and let 04/07/2021 know the result. She is a candidate for oral antiviral for covid if positive (BMI 38). Will eRx amoxil 875 bid x 10d for sinusitis. Tessalon pearls 200mg  tid prn cough. Rest, fluids.  I discussed the assessment and treatment plan with the patient. The patient was provided an opportunity  to ask questions and all were answered. The patient agreed with the plan and demonstrated an understanding of the instructions.   F/u: if not improving.  Signed:  [place], MD           02/15/2021

## 2021-02-22 ENCOUNTER — Ambulatory Visit
Admission: RE | Admit: 2021-02-22 | Discharge: 2021-02-22 | Disposition: A | Discharge status: Home / Self Care | Payer: BC Managed Care – PPO | Source: Ambulatory Visit | Attending: Orthopedic Surgery | Admitting: Orthopedic Surgery

## 2021-02-22 ENCOUNTER — Other Ambulatory Visit: Payer: Self-pay

## 2021-02-22 DIAGNOSIS — M533 Sacrococcygeal disorders, not elsewhere classified: Secondary | ICD-10-CM | POA: Diagnosis not present

## 2021-02-22 DIAGNOSIS — G8929 Other chronic pain: Secondary | ICD-10-CM

## 2021-02-22 MED ORDER — METHYLPREDNISOLONE ACETATE 40 MG/ML INJ SUSP (RADIOLOG
80.0000 mg | Freq: Once | INTRAMUSCULAR | Status: AC
  Administered 2021-02-22: 80 mg via INTRA_ARTICULAR

## 2021-02-26 ENCOUNTER — Telehealth: Payer: Self-pay

## 2021-02-26 DIAGNOSIS — Z01419 Encounter for gynecological examination (general) (routine) without abnormal findings: Secondary | ICD-10-CM | POA: Diagnosis not present

## 2021-02-26 DIAGNOSIS — N920 Excessive and frequent menstruation with regular cycle: Secondary | ICD-10-CM | POA: Diagnosis not present

## 2021-02-26 DIAGNOSIS — Z1231 Encounter for screening mammogram for malignant neoplasm of breast: Secondary | ICD-10-CM | POA: Diagnosis not present

## 2021-02-26 DIAGNOSIS — Z6835 Body mass index (BMI) 35.0-35.9, adult: Secondary | ICD-10-CM | POA: Diagnosis not present

## 2021-02-26 NOTE — Telephone Encounter (Signed)
New message    Pending  Sheila Mathews (Key: [id number]) Rx #: [id number] Aimovig 140MG /ML auto-injectors   Form [employer] [place] Commercial Electronic Request Form (CB) Created 8 days ago Sent to Plan 9 minutes ago Plan Response 9 minutes ago Submit Clinical Questions less than a minute ago Determination Wait for Determination Please wait for to return a determination.

## 2021-02-27 NOTE — Telephone Encounter (Signed)
F/u  Resubmit  Sheila Mathews (Key: [id number]) Aimovig 140MG /ML auto-injectors   Form [employer] [place] Commercial Electronic Request Form (CB) Created 14 minutes ago Sent to Plan 14 minutes ago Plan Response 14 minutes ago Submit Clinical Questions 2 minutes ago Determination Wait for Determination Please wait for to return a determination.

## 2021-03-07 NOTE — Telephone Encounter (Signed)
F/u   Appeal   Conni Slipper (Key: [id number]) Aimovig 140MG /ML auto-injectors   Form Letter of Consent Drug Appeal Form Appeal Created 9 days ago Sent to Plan 5 minutes ago Determination Wait for Determination Please wait for the payer to return a determination.

## 2021-03-19 NOTE — Telephone Encounter (Signed)
Letter Received from [facility]  Aimovig 140 mg approved 02/26/21-02/25/22

## 2021-04-09 DIAGNOSIS — N858 Other specified noninflammatory disorders of uterus: Secondary | ICD-10-CM | POA: Diagnosis not present

## 2021-04-09 DIAGNOSIS — Z4889 Encounter for other specified surgical aftercare: Secondary | ICD-10-CM | POA: Diagnosis not present

## 2021-04-09 DIAGNOSIS — N920 Excessive and frequent menstruation with regular cycle: Secondary | ICD-10-CM | POA: Diagnosis not present

## 2021-05-02 DIAGNOSIS — Z30430 Encounter for insertion of intrauterine contraceptive device: Secondary | ICD-10-CM | POA: Diagnosis not present

## 2021-05-02 DIAGNOSIS — N920 Excessive and frequent menstruation with regular cycle: Secondary | ICD-10-CM | POA: Diagnosis not present

## 2021-05-14 DIAGNOSIS — F429 Obsessive-compulsive disorder, unspecified: Secondary | ICD-10-CM | POA: Diagnosis not present

## 2021-05-14 DIAGNOSIS — F4310 Post-traumatic stress disorder, unspecified: Secondary | ICD-10-CM | POA: Diagnosis not present

## 2021-05-14 DIAGNOSIS — F3189 Other bipolar disorder: Secondary | ICD-10-CM | POA: Diagnosis not present

## 2021-06-08 DIAGNOSIS — Z4889 Encounter for other specified surgical aftercare: Secondary | ICD-10-CM | POA: Diagnosis not present

## 2021-06-08 DIAGNOSIS — M259 Joint disorder, unspecified: Secondary | ICD-10-CM | POA: Diagnosis not present

## 2021-06-13 DIAGNOSIS — M533 Sacrococcygeal disorders, not elsewhere classified: Secondary | ICD-10-CM | POA: Diagnosis not present

## 2021-06-21 DIAGNOSIS — F3189 Other bipolar disorder: Secondary | ICD-10-CM | POA: Diagnosis not present

## 2021-06-21 DIAGNOSIS — F429 Obsessive-compulsive disorder, unspecified: Secondary | ICD-10-CM | POA: Diagnosis not present

## 2021-06-21 DIAGNOSIS — F4310 Post-traumatic stress disorder, unspecified: Secondary | ICD-10-CM | POA: Diagnosis not present

## 2021-06-26 DIAGNOSIS — M9904 Segmental and somatic dysfunction of sacral region: Secondary | ICD-10-CM | POA: Diagnosis not present

## 2021-06-27 DIAGNOSIS — Z01810 Encounter for preprocedural cardiovascular examination: Secondary | ICD-10-CM | POA: Diagnosis not present

## 2021-06-27 DIAGNOSIS — Z7901 Long term (current) use of anticoagulants: Secondary | ICD-10-CM | POA: Diagnosis not present

## 2021-07-01 HISTORY — PX: LUMBAR DISC SURGERY: SHX700

## 2021-07-20 ENCOUNTER — Ambulatory Visit: Payer: BC Managed Care – PPO | Admitting: Family Medicine

## 2021-09-04 DIAGNOSIS — M25552 Pain in left hip: Secondary | ICD-10-CM | POA: Diagnosis not present

## 2021-09-13 DIAGNOSIS — F4310 Post-traumatic stress disorder, unspecified: Secondary | ICD-10-CM | POA: Diagnosis not present

## 2021-09-13 DIAGNOSIS — F429 Obsessive-compulsive disorder, unspecified: Secondary | ICD-10-CM | POA: Diagnosis not present

## 2021-09-13 DIAGNOSIS — F3189 Other bipolar disorder: Secondary | ICD-10-CM | POA: Diagnosis not present

## 2021-09-19 DIAGNOSIS — Z01810 Encounter for preprocedural cardiovascular examination: Secondary | ICD-10-CM | POA: Diagnosis not present

## 2021-10-02 DIAGNOSIS — M545 Low back pain, unspecified: Secondary | ICD-10-CM | POA: Diagnosis not present

## 2021-10-05 DIAGNOSIS — M533 Sacrococcygeal disorders, not elsewhere classified: Secondary | ICD-10-CM | POA: Diagnosis not present

## 2021-10-05 DIAGNOSIS — M9904 Segmental and somatic dysfunction of sacral region: Secondary | ICD-10-CM | POA: Diagnosis not present

## 2021-10-30 NOTE — Progress Notes (Deleted)
Virtual Visit via Video Note The purpose of this virtual visit is to provide medical care while limiting exposure to the novel coronavirus.    Consent was obtained for video visit:  Yes.   Answered questions that patient had about telehealth interaction:  Yes.   I discussed the limitations, risks, security and privacy concerns of performing an evaluation and management service by telemedicine. I also discussed with the patient that there may be a patient responsible charge related to this service. The patient expressed understanding and agreed to proceed.  Pt location: Home Physician Location: office Name of referring provider:  Natalia Leatherwood, DO I connected with [name] at patients initiation/request on 10/31/2021 at  8:50 AM EDT by video enabled telemedicine application and verified that I am speaking with the correct person using two identifiers. Pt MRN:  [id number] Pt DOB:  July 17, 1975 Video Participants:  [name]  Assessment and Plan:    Menstrual migraines 2.    Migraine with aura, without status migrainosus, not intractable   Migraine prevention:  Aimovig 140mg  Q4wks Migraine rescue:  sumatriptan 100mg  Limit use of pain relievers to no more than 2 days out of week to prevent risk of rebound or medication-overuse headache. Keep headache diary Follow up 9 months     History of Present Illness:  Sheila Mathews is a 46 year old right-handed woman history of migraines, panic attacks, anxiety and allergic rhinitis who follows up for migraines.   UPDATE: Intensity:  2-3/10 but may have 10/10 once a month Duration:  1 to 2 hours.   Frequency:  5 in 30 days Current NSAIDS:  none Current analgesics:  none Current triptans:  Sumatriptan 100mg  Current ergotamine:  none Current anti-emetic:  none Current muscle relaxants:  Robaxin Current anti-anxiolytic:  Klonopin Current sleep aide:  none Current Antihypertensive medications:  none Current  Antidepressant/antipsychotic/mood medications:  Sertraline 150mg , Lithium Current Anticonvulsant medications:  lamotrigine 100mg  daily Current anti-CGRP:  Aimovig 140mg  Current Vitamins/Herbal/Supplements:  none Current Antihistamines/Decongestants:  Flonase Other therapy:  none Hormone/birth control:  none   Caffeine:  1 cup of coffee per day Alcohol:  Occasionally Smoker:  No Diet:  8 healthy. Drinks a lot of water. Exercise:  Good Depression/stress:  Anxiety controlled Other pain:  Back pain - plan for surgery. Sleep hygiene:  Wakes up during the night and difficulty falling back asleep   HISTORY:  Onset:  46 years old Location:  Usually right-sided, retro-orbital, and moves to the top of the head Quality:  Usually pressure-like, sometimes stabbing Initial Intensity:  Usually 6-7/10, 10 out of 10 for severe Aura:  Sometimes preceded by fuzzy and tunnel vision - when closes eyes she sees flashes of light.  Prodrome:  No Associated symptoms:  First severe, experiences nausea, blurred vision, photophobia, phonophobia, and osmophobia. Initial Duration:  Severe attacks last up to 4 days. Otherwise 2-3 hours Initial Frequency:  Severe attacks occur once a month (4 days per month), otherwise other headaches occur 2 days per week. Total of 15-18 headache days per month. Triggers/exacerbating factors:  Menstrual cycle Relieving factors:  Phenergan/Toradol shots Activity:  Cannot function with severe attacks   For years, she had headaches occurring 2 days a week, lasting 2-3 hours, but had severe migraines once a month lasting 4 days.  In 2019, they started occurring only twice a month but still lasting 4 days..   Past NSAIDS:  Ketorolac, naproxen Past analgesics:  Excedrin, Extra-strength Tylenol Past abortive triptans:  Relpax 40mg ,  Maxalt 10mg , sumatriptan injection Past abortive ergotamine:  none Past muscle relaxants:  Flexeril Past anti-emetic:  Zofran 4mg  Past antihypertensive  medications:  No beta blockers as runs low HR Past antidepressant medications:  amitriptline 10mg  (side effects), citalopram Past anticonvulsant medications:  Depakote, topiramate, gabapentin (caused brain fog) Past anti-CGRP:  Ubrelvy 100mg  (ineffective), Nurtec (rescue- ineffective) Other past therapies:  non     Family history of headache:  No  Past Medical History: Past Medical History:  Diagnosis Date   Abnormal cervical Papanicolaou smear 04/20/2020   Allergies    Anxiety    Bipolar disorder (HCC)    Chicken pox    Chronic sinusitis 07/26/2016   Complication of anesthesia    pt doesn't like mask she has panic attack; childhood   Depression    Family history of adverse reaction to anesthesia    Mother PONV   GERD (gastroesophageal reflux disease)    History of anemia    during pregnancy   Migraines    Pelvic pain in female 05/13/2014   Pneumonia    PONV (postoperative nausea and vomiting)    no issues with short procdures, had one isntance of vomitus following a longer procedure    Sciatica    Thyroid disease     Medications: Outpatient Encounter Medications as of 10/31/2021  Medication Sig   ARIPiprazole (ABILIFY) 5 MG tablet Take 5 mg by mouth daily.   azelastine (ASTELIN) 0.1 % nasal spray Place 1 spray into both nostrils 2 (two) times daily.   benzonatate (TESSALON) 200 MG capsule Take 1 capsule (200 mg total) by mouth 3 (three) times daily as needed for cough.   clonazePAM (KLONOPIN) 0.5 MG tablet Take 0.5 mg by mouth 3 (three) times daily as needed for anxiety.   Erenumab-aooe (AIMOVIG) 140 MG/ML SOAJ Inject 140 mg into the skin every 28 (twenty-eight) days.   lamoTRIgine (LAMICTAL) 200 MG tablet Take 200 mg by mouth daily.   levothyroxine (SYNTHROID) 25 MCG tablet 1 tab daily p.o. on an empty stomach x6 days a week, 2 tabs p.o. 1 day a week.   lithium carbonate (LITHOBID) 300 MG CR tablet Take 900 mg by mouth daily.   sertraline (ZOLOFT) 100 MG tablet Take 150  mg by mouth daily.   SUMAtriptan (IMITREX) 100 MG tablet Take 1 tablet earliest onset of migraine.  May repeat in 2 hours. Maximum 2 tablets in 24 hours.   zolpidem (AMBIEN) 10 MG tablet Take 10 mg by mouth at bedtime as needed for sleep.   No facility-administered encounter medications on file as of 10/31/2021.    Allergies: Allergies  Allergen Reactions   Almond (Diagnostic) Anaphylaxis   Codeine Other (See Comments)    Makes patient aggressive     Hydrocodone Other (See Comments)    Makes patient aggressive     Family History: Family History  Problem Relation Age of Onset   Depression Mother    Mental illness Mother    Diabetes Brother    Drug abuse Brother    Early death Brother    Mental illness Brother    Breast cancer Maternal Grandmother 30   Cancer Maternal Grandfather        head neck    Diabetes Maternal Grandfather    Cancer Paternal Grandmother        gallbladder   Colon cancer Paternal Grandfather 54   Diabetes Paternal Grandfather     Observations/Objective:   No acute distress.  Alert and oriented.  Speech fluent  and not dysarthric.  Language intact.  Eyes orthophoric on primary gaze.  Face symmetric.   Follow Up Instructions:    -I discussed the assessment and treatment plan with the patient. The patient was provided an opportunity to ask questions and all were answered. The patient agreed with the plan and demonstrated an understanding of the instructions.   The patient was advised to call back or seek an in-person evaluation if the symptoms worsen or if the condition fails to improve as anticipated.  Cira ServantAdam Robert Jacquelene Kopecky, DO

## 2021-10-31 ENCOUNTER — Telehealth: Payer: BC Managed Care – PPO | Admitting: Neurology

## 2021-10-31 ENCOUNTER — Encounter: Payer: Self-pay | Admitting: Neurology

## 2021-10-31 DIAGNOSIS — Z029 Encounter for administrative examinations, unspecified: Secondary | ICD-10-CM

## 2021-11-09 ENCOUNTER — Ambulatory Visit: Payer: BC Managed Care – PPO | Admitting: Family Medicine

## 2021-11-09 ENCOUNTER — Telehealth: Payer: Self-pay

## 2021-11-09 ENCOUNTER — Encounter: Payer: Self-pay | Admitting: Family Medicine

## 2021-11-09 NOTE — Telephone Encounter (Signed)
FYI

## 2021-11-09 NOTE — Telephone Encounter (Signed)
No Show/Cancel within 24 hours ? ?Patient called at 8:50am to cancel same day appt )1:20pm)- pt has migraine - resch to 5/17 ?

## 2021-11-09 NOTE — Telephone Encounter (Signed)
Letter sent - 1st same day cancel ?

## 2021-11-13 DIAGNOSIS — Z4889 Encounter for other specified surgical aftercare: Secondary | ICD-10-CM | POA: Diagnosis not present

## 2021-11-14 ENCOUNTER — Encounter: Payer: Self-pay | Admitting: Family Medicine

## 2021-11-14 ENCOUNTER — Ambulatory Visit (INDEPENDENT_AMBULATORY_CARE_PROVIDER_SITE_OTHER): Payer: BC Managed Care – PPO | Admitting: Family Medicine

## 2021-11-14 VITALS — BP 87/60 | HR 74 | Temp 98.2°F | Ht 64.0 in | Wt 220.0 lb

## 2021-11-14 DIAGNOSIS — R400 Somnolence: Secondary | ICD-10-CM | POA: Diagnosis not present

## 2021-11-14 DIAGNOSIS — R0681 Apnea, not elsewhere classified: Secondary | ICD-10-CM

## 2021-11-14 DIAGNOSIS — R0683 Snoring: Secondary | ICD-10-CM | POA: Diagnosis not present

## 2021-11-14 NOTE — Patient Instructions (Signed)

## 2021-11-14 NOTE — Progress Notes (Signed)
? ? ? ? ? ?Sheila CharonKristi N Rosas , 05/23/1976, 46 y.o., female ?MRN: [id number] ?Patient Care Team  ?  Relationship Specialty Notifications Start End  ?Natalia LeatherwoodKuneff, Ahriana Gunkel A, DO PCP - General Family Medicine  05/05/20   ?Harold Hedgeomblin, James, MD Consulting Physician Obstetrics and Gynecology  02/05/21   ?Drema DallasJaffe, Adam R, DO Consulting Physician Neurology  02/05/21   ?Jeani HawkingHung, Patrick, MD Consulting Physician Gastroenterology  02/05/21   ?Venita LickBrooks, Dahari, MD Consulting Physician Orthopedic Surgery  02/05/21   ? ? ?Chief Complaint  ?Patient presents with  ? Snoring  ?  Pt c/o snoring with pauses in breathing x 2 years  ? ?  ?Subjective: Pt presents for an OV with complaints of snoring, exhausted and has woken herself up snoring. Her husband has witnessed apneic spells. Associated symptoms include daytime somnolence ?Body mass index is 37.76 kg/m?Marland Kitchen. ?Patient is prescribed Zoloft, trazodone, lithium, Klonopin, Lamictal, gabapentin by her psychiatry team. ?She reports she had a sleep study many years ago due to similar symptoms.  However, she was unable to sleep during the sleep study that was completed in our lab. ? ? ?  05/05/2020  ?  9:26 AM  ?Depression screen PHQ 2/9  ?Decreased Interest 1  ?Down, Depressed, Hopeless 1  ?PHQ - 2 Score 2  ?Altered sleeping 3  ?Tired, decreased energy 1  ?Change in appetite 3  ?Feeling bad or failure about yourself  1  ?Trouble concentrating 0  ?Moving slowly or fidgety/restless 0  ?Suicidal thoughts 0  ?PHQ-9 Score 10  ? ? ?Allergies  ?Allergen Reactions  ? Almond (Diagnostic) Anaphylaxis  ? Codeine Other (See Comments)  ?  Makes patient aggressive  ?  ? Hydrocodone Other (See Comments)  ?  Makes patient aggressive   ? ?Social History  ? ?Social History Narrative  ? Marital status/children/pets: Married, 3 children.   ? Education/employment: Bachelor's degree, healthcare advocate  ? Safety:   ?   -smoke alarm in the home:Yes  ?   - wears seatbelt: Yes  ?   - Feels safe in their relationships: Yes  ? Right handed  ? No  caffeine  ? One story home  ? ?Past Medical History:  ?Diagnosis Date  ? Abnormal cervical Papanicolaou smear 04/20/2020  ? Allergies   ? Anxiety   ? Bipolar disorder (HCC)   ? Chicken pox   ? Chronic sinusitis 07/26/2016  ? Complication of anesthesia   ? pt doesn't like mask she has panic attack; childhood  ? Depression   ? Family history of adverse reaction to anesthesia   ? Mother PONV  ? GERD (gastroesophageal reflux disease)   ? History of anemia   ? during pregnancy  ? Migraines   ? Pelvic pain in female 05/13/2014  ? Pneumonia   ? PONV (postoperative nausea and vomiting)   ? no issues with short procdures, had one isntance of vomitus following a longer procedure   ? Sciatica   ? Thyroid disease   ? ?Past Surgical History:  ?Procedure Laterality Date  ? ABDOMINAL EXPOSURE N/A 09/21/2020  ? Procedure: LATERAL EXPOSURE FOR OBLIQUE LATERAL SPINE SURGERY;  Surgeon: Cephus Shellinglark, Christopher J, MD;  Location: [facility];  Service: Vascular;  Laterality: N/A;  ? ANTERIOR LATERAL LUMBAR FUSION WITH PERCUTANEOUS SCREW 1 LEVEL N/A 09/21/2020  ? Procedure: OBLIQUE LUMBAR INTERBODY FUSION LUMBAR FOUR THROUGH FIVE, POSTERIOR SPINAL FUSION INSTRUMENTATION LUMAR FOUR THROUGH FIVE;  Surgeon: Venita LickBrooks, Dahari, MD;  Location: [facility];  Service: Orthopedics;  Laterality: N/A;  ?  CESAREAN SECTION  x2  ? 2000, 2005  ? LAPAROSCOPY N/A 05/13/2014  ? Procedure: LAPAROSCOPY DIAGNOSTIC;  Surgeon: Lavina Hamman, MD;  Location: [facility];  Service: Gynecology;  Laterality: N/A;  ? LUMBAR LAMINECTOMY/DECOMPRESSION MICRODISCECTOMY Left 02/12/2017  ? Procedure: Microlumbar decompression L5-S1 left ;  Surgeon: Jene Every, MD;  Location: [facility];  Service: Orthopedics;  Laterality: Left;  90 mins  ? SALIVARY GLAND SURGERY  1986  ? UPPER GI ENDOSCOPY    ? WISDOM TOOTH EXTRACTION    ? ?Family History  ?Problem Relation Age of Onset  ? Depression Mother   ? Mental illness Mother   ? Diabetes Brother   ? Drug abuse Brother   ? Early death Brother   ? Mental  illness Brother   ? Breast cancer Maternal Grandmother 30  ? Cancer Maternal Grandfather   ?     head neck   ? Diabetes Maternal Grandfather   ? Cancer Paternal Grandmother   ?     gallbladder  ? Colon cancer Paternal Grandfather 49  ? Diabetes Paternal Grandfather   ? ?Allergies as of 11/14/2021   ? ?   Reactions  ? Almond (diagnostic) Anaphylaxis  ? Codeine Other (See Comments)  ? Makes patient aggressive   ? Hydrocodone Other (See Comments)  ? Makes patient aggressive   ? ?  ? ?  ?Medication List  ?  ? ?  ? Accurate as of Nov 14, 2021  4:34 PM. If you have any questions, ask your nurse or doctor.  ?  ?  ? ?  ? ?STOP taking these medications   ? ?Aimovig 140 MG/ML Soaj ?Generic drug: Erenumab-aooe ?Stopped by: Felix Pacini, DO ?  ?azelastine 0.1 % nasal spray ?Commonly known as: ASTELIN ?Stopped by: Felix Pacini, DO ?  ?benzonatate 200 MG capsule ?Commonly known as: TESSALON ?Stopped by: Felix Pacini, DO ?  ?zolpidem 10 MG tablet ?Commonly known as: AMBIEN ?Stopped by: Felix Pacini, DO ?  ? ?  ? ?TAKE these medications   ? ?ARIPiprazole 5 MG tablet ?Commonly known as: ABILIFY ?Take 5 mg by mouth daily. ?  ?ARIPiprazole 10 MG tablet ?Commonly known as: ABILIFY ?Take 10 mg by mouth daily. ?  ?celecoxib 200 MG capsule ?Commonly known as: CELEBREX ?Take 200 mg by mouth 2 (two) times daily. ?  ?clonazePAM 0.5 MG tablet ?Commonly known as: KLONOPIN ?Take 0.5 mg by mouth 3 (three) times daily as needed for anxiety. ?  ?gabapentin 300 MG capsule ?Commonly known as: NEURONTIN ?Take 300 mg by mouth 3 (three) times daily. ?  ?lamoTRIgine 200 MG tablet ?Commonly known as: LAMICTAL ?Take 200 mg by mouth daily. ?  ?levothyroxine 25 MCG tablet ?Commonly known as: SYNTHROID ?1 tab daily p.o. on an empty stomach x6 days a week, 2 tabs p.o. 1 day a week. ?  ?lithium carbonate 300 MG CR tablet ?Commonly known as: LITHOBID ?Take 900 mg by mouth daily. ?  ?Mirena (52 MG) 20 MCG/DAY Iud ?Generic drug: levonorgestrel ?Mirena 21 mcg/24  hours (8 yrs) 52 mg intrauterine device ? Take 1 device by intrauterine route. ?  ?sertraline 100 MG tablet ?Commonly known as: ZOLOFT ?Take 150 mg by mouth daily. ?  ?SUMAtriptan 100 MG tablet ?Commonly known as: IMITREX ?Take 1 tablet earliest onset of migraine.  May repeat in 2 hours. Maximum 2 tablets in 24 hours. ?  ?traZODone 100 MG tablet ?Commonly known as: DESYREL ?Take 100 mg by mouth at bedtime. ?  ? ?  ? ? ?  All past medical history, surgical history, allergies, family history, immunizations andmedications were updated in the EMR today and reviewed under the history and medication portions of their EMR.    ? ?ROS ?Negative, with the exception of above mentioned in HPI ? ? ?Objective:  ?BP (!) 87/60   Pulse 74   Temp 98.2 ?F (36.8 ?C) (Oral)   Ht 5\' 4"  (1.626 m)   Wt 220 lb (99.8 kg)   LMP 11/07/2021   SpO2 97%   BMI 37.76 kg/m?  ?Body mass index is 37.76 kg/m?01/07/2022 ?Physical Exam ?Vitals and nursing note reviewed.  ?Constitutional:   ?   General: She is not in acute distress. ?   Appearance: Normal appearance. She is not ill-appearing, toxic-appearing or diaphoretic.  ?HENT:  ?   Head: Normocephalic and atraumatic.  ?   Right Ear: Tympanic membrane normal. There is no impacted cerumen.  ?   Left Ear: Tympanic membrane normal. There is no impacted cerumen.  ?   Nose: Congestion present. No rhinorrhea.  ?   Comments: Large nasal turbinates bilaterally ?Septum midline ?   Mouth/Throat:  ?   Comments: Very small nasopharynx ?Eyes:  ?   General: No scleral icterus.    ?   Right eye: No discharge.     ?   Left eye: No discharge.  ?   Extraocular Movements: Extraocular movements intact.  ?   Conjunctiva/sclera: Conjunctivae normal.  ?   Pupils: Pupils are equal, round, and reactive to light.  ?Cardiovascular:  ?   Rate and Rhythm: Normal rate and regular rhythm.  ?Pulmonary:  ?   Effort: Pulmonary effort is normal. No respiratory distress.  ?   Breath sounds: Normal breath sounds. No wheezing, rhonchi or  rales.  ?Musculoskeletal:  ?   Cervical back: Neck supple. No tenderness.  ?Lymphadenopathy:  ?   Cervical: No cervical adenopathy.  ?Skin: ?   General: Skin is warm and dry.  ?   Coloration: Skin is not jaundiced or pa

## 2021-11-16 DIAGNOSIS — M5416 Radiculopathy, lumbar region: Secondary | ICD-10-CM | POA: Diagnosis not present

## 2021-11-19 DIAGNOSIS — M5416 Radiculopathy, lumbar region: Secondary | ICD-10-CM | POA: Diagnosis not present

## 2021-11-28 DIAGNOSIS — M5416 Radiculopathy, lumbar region: Secondary | ICD-10-CM | POA: Diagnosis not present

## 2021-11-28 DIAGNOSIS — F3132 Bipolar disorder, current episode depressed, moderate: Secondary | ICD-10-CM | POA: Diagnosis not present

## 2021-11-30 DIAGNOSIS — M5416 Radiculopathy, lumbar region: Secondary | ICD-10-CM | POA: Diagnosis not present

## 2021-12-04 DIAGNOSIS — M5416 Radiculopathy, lumbar region: Secondary | ICD-10-CM | POA: Diagnosis not present

## 2021-12-04 DIAGNOSIS — F3132 Bipolar disorder, current episode depressed, moderate: Secondary | ICD-10-CM | POA: Diagnosis not present

## 2021-12-06 DIAGNOSIS — F3189 Other bipolar disorder: Secondary | ICD-10-CM | POA: Diagnosis not present

## 2021-12-06 DIAGNOSIS — E538 Deficiency of other specified B group vitamins: Secondary | ICD-10-CM | POA: Diagnosis not present

## 2021-12-06 DIAGNOSIS — E559 Vitamin D deficiency, unspecified: Secondary | ICD-10-CM | POA: Diagnosis not present

## 2021-12-06 DIAGNOSIS — Z79899 Other long term (current) drug therapy: Secondary | ICD-10-CM | POA: Diagnosis not present

## 2021-12-07 DIAGNOSIS — M5416 Radiculopathy, lumbar region: Secondary | ICD-10-CM | POA: Diagnosis not present

## 2021-12-07 DIAGNOSIS — E559 Vitamin D deficiency, unspecified: Secondary | ICD-10-CM | POA: Diagnosis not present

## 2021-12-07 DIAGNOSIS — E538 Deficiency of other specified B group vitamins: Secondary | ICD-10-CM | POA: Diagnosis not present

## 2021-12-07 DIAGNOSIS — Z5181 Encounter for therapeutic drug level monitoring: Secondary | ICD-10-CM | POA: Diagnosis not present

## 2021-12-07 DIAGNOSIS — Z79899 Other long term (current) drug therapy: Secondary | ICD-10-CM | POA: Diagnosis not present

## 2021-12-11 DIAGNOSIS — M5136 Other intervertebral disc degeneration, lumbar region: Secondary | ICD-10-CM | POA: Diagnosis not present

## 2021-12-12 DIAGNOSIS — F3132 Bipolar disorder, current episode depressed, moderate: Secondary | ICD-10-CM | POA: Diagnosis not present

## 2021-12-16 ENCOUNTER — Other Ambulatory Visit: Payer: Self-pay | Admitting: Neurology

## 2021-12-17 DIAGNOSIS — F3189 Other bipolar disorder: Secondary | ICD-10-CM | POA: Diagnosis not present

## 2021-12-17 DIAGNOSIS — F4310 Post-traumatic stress disorder, unspecified: Secondary | ICD-10-CM | POA: Diagnosis not present

## 2021-12-17 DIAGNOSIS — F429 Obsessive-compulsive disorder, unspecified: Secondary | ICD-10-CM | POA: Diagnosis not present

## 2021-12-20 NOTE — Telephone Encounter (Signed)
Needs to make a follow up appointment prior to refill

## 2021-12-21 DIAGNOSIS — M5136 Other intervertebral disc degeneration, lumbar region: Secondary | ICD-10-CM | POA: Diagnosis not present

## 2021-12-25 DIAGNOSIS — M5136 Other intervertebral disc degeneration, lumbar region: Secondary | ICD-10-CM | POA: Diagnosis not present

## 2021-12-26 DIAGNOSIS — Z6837 Body mass index (BMI) 37.0-37.9, adult: Secondary | ICD-10-CM | POA: Diagnosis not present

## 2021-12-26 DIAGNOSIS — E039 Hypothyroidism, unspecified: Secondary | ICD-10-CM | POA: Diagnosis not present

## 2021-12-26 DIAGNOSIS — Z131 Encounter for screening for diabetes mellitus: Secondary | ICD-10-CM | POA: Diagnosis not present

## 2021-12-26 DIAGNOSIS — R030 Elevated blood-pressure reading, without diagnosis of hypertension: Secondary | ICD-10-CM | POA: Diagnosis not present

## 2021-12-26 DIAGNOSIS — Z0000 Encounter for general adult medical examination without abnormal findings: Secondary | ICD-10-CM | POA: Diagnosis not present

## 2021-12-27 DIAGNOSIS — R946 Abnormal results of thyroid function studies: Secondary | ICD-10-CM | POA: Diagnosis not present

## 2021-12-28 ENCOUNTER — Telehealth: Payer: Self-pay | Admitting: Neurology

## 2021-12-28 DIAGNOSIS — M5416 Radiculopathy, lumbar region: Secondary | ICD-10-CM | POA: Diagnosis not present

## 2021-12-28 DIAGNOSIS — Z4889 Encounter for other specified surgical aftercare: Secondary | ICD-10-CM | POA: Diagnosis not present

## 2021-12-28 DIAGNOSIS — Z131 Encounter for screening for diabetes mellitus: Secondary | ICD-10-CM | POA: Diagnosis not present

## 2021-12-28 DIAGNOSIS — E039 Hypothyroidism, unspecified: Secondary | ICD-10-CM | POA: Diagnosis not present

## 2021-12-28 DIAGNOSIS — Z0000 Encounter for general adult medical examination without abnormal findings: Secondary | ICD-10-CM | POA: Diagnosis not present

## 2021-12-28 MED ORDER — SUMATRIPTAN SUCCINATE 100 MG PO TABS: ORAL_TABLET | ORAL | 2 refills | Status: DC

## 2021-12-28 NOTE — Telephone Encounter (Signed)
1. Which medications need refilled? (List name and dosage, if known) sumatriptan 100 MG  2. Which pharmacy/location is medication to be sent to? (include street and city if local pharmacy) [facility] [place]  3. Do they need a 30 day or 90 day supply? 30 days

## 2022-01-09 ENCOUNTER — Institutional Professional Consult (permissible substitution): Payer: BC Managed Care – PPO | Admitting: Neurology

## 2022-01-09 DIAGNOSIS — F3132 Bipolar disorder, current episode depressed, moderate: Secondary | ICD-10-CM | POA: Diagnosis not present

## 2022-01-09 NOTE — Progress Notes (Unsigned)
Virtual Visit via Video Note  Consent was obtained for video visit:  Yes.   Answered questions that patient had about telehealth interaction:  Yes.   I discussed the limitations, risks, security and privacy concerns of performing an evaluation and management service by telemedicine. I also discussed with the patient that there may be a patient responsible charge related to this service. The patient expressed understanding and agreed to proceed.  Pt location: Home Physician Location: office Name of referring provider:  Natalia Leatherwood, DO I connected with [name] at patients initiation/request on 01/10/2022 at  8:30 AM EDT by video enabled telemedicine application and verified that I am speaking with the correct person using two identifiers. Pt MRN:  [id number] Pt DOB:  11-13-1975 Video Participants:  [name]  Assessment and Plan:    Menstrual migraines 2.    Migraine with aura, without status migrainosus, not intractable   Migraine prevention:  Aimovig 140mg  Q4wks Migraine rescue:  sumatriptan 100mg  Limit use of pain relievers to no more than 2 days out of week to prevent risk of rebound or medication-overuse headache. Keep headache diary Follow up 9 months     History of Present Illness:  Sheila Mathews is a 46 year old right-handed woman history of migraines, panic attacks, anxiety and allergic rhinitis who follows up for migraines.   UPDATE: Intensity:  2-3/10 but may have 10/10 once a month Duration:  1 to 2 hours.   Frequency:  5 in 30 days Current NSAIDS:  none Current analgesics:  none Current triptans:  Sumatriptan 100mg  Current ergotamine:  none Current anti-emetic:  none Current muscle relaxants:  Robaxin Current anti-anxiolytic:  Klonopin Current sleep aide:  none Current Antihypertensive medications:  none Current Antidepressant/antipsychotic/mood medications:  Sertraline 150mg , Lithium Current Anticonvulsant medications:  lamotrigine 100mg   daily Current anti-CGRP:  Aimovig 140mg  Current Vitamins/Herbal/Supplements:  none Current Antihistamines/Decongestants:  Flonase Other therapy:  none Hormone/birth control:  none   Caffeine:  1 cup of coffee per day Alcohol:  Occasionally Smoker:  No Diet:  8 healthy. Drinks a lot of water. Exercise:  Good Depression/stress:  Anxiety controlled Other pain:  Back pain - plan for surgery. Sleep hygiene:  Wakes up during the night and difficulty falling back asleep   HISTORY:  Onset:  46 years old Location:  Usually right-sided, retro-orbital, and moves to the top of the head Quality:  Usually pressure-like, sometimes stabbing Initial Intensity:  Usually 6-7/10, 10 out of 10 for severe Aura:  Sometimes preceded by fuzzy and tunnel vision - when closes eyes she sees flashes of light.  Prodrome:  No Associated symptoms:  First severe, experiences nausea, blurred vision, photophobia, phonophobia, and osmophobia. Initial Duration:  Severe attacks last up to 4 days. Otherwise 2-3 hours Initial Frequency:  Severe attacks occur once a month (4 days per month), otherwise other headaches occur 2 days per week. Total of 15-18 headache days per month. Triggers/exacerbating factors:  Menstrual cycle Relieving factors:  Phenergan/Toradol shots Activity:  Cannot function with severe attacks   For years, she had headaches occurring 2 days a week, lasting 2-3 hours, but had severe migraines once a month lasting 4 days.  In 2019, they started occurring only twice a month but still lasting 4 days..   Past NSAIDS:  Ketorolac, naproxen Past analgesics:  Excedrin, Extra-strength Tylenol Past abortive triptans:  Relpax 40mg , Maxalt 10mg , sumatriptan injection Past abortive ergotamine:  none Past muscle relaxants:  Flexeril Past anti-emetic:  Zofran 4mg  Past  antihypertensive medications:  No beta blockers as runs low HR Past antidepressant medications:  amitriptline 10mg  (side effects),  citalopram Past anticonvulsant medications:  Depakote, topiramate, gabapentin (caused brain fog) Past anti-CGRP:  Ubrelvy 100mg  (ineffective), Nurtec (rescue- ineffective) Other past therapies:  non     Family history of headache:  No  Past Medical History: Past Medical History:  Diagnosis Date   Abnormal cervical Papanicolaou smear 04/20/2020   Allergies    Anxiety    Bipolar disorder ([place])    Chicken pox    Chronic sinusitis [id number]   Complication of anesthesia    pt doesn't like mask she has panic attack; childhood   Depression    Family history of adverse reaction to anesthesia    Mother PONV   GERD (gastroesophageal reflux disease)    History of anemia    during pregnancy   Migraines    Pelvic pain in female 05/13/2014   Pneumonia    PONV (postoperative nausea and vomiting)    no issues with short procdures, had one isntance of vomitus following a longer procedure    Sciatica    Thyroid disease     Medications: Outpatient Encounter Medications as of 01/10/2022  Medication Sig   ARIPiprazole (ABILIFY) 10 MG tablet Take 10 mg by mouth daily.   ARIPiprazole (ABILIFY) 5 MG tablet Take 5 mg by mouth daily.   celecoxib (CELEBREX) 200 MG capsule Take 200 mg by mouth 2 (two) times daily.   clonazePAM (KLONOPIN) 0.5 MG tablet Take 0.5 mg by mouth 3 (three) times daily as needed for anxiety.   gabapentin (NEURONTIN) 300 MG capsule Take 300 mg by mouth 3 (three) times daily.   lamoTRIgine (LAMICTAL) 200 MG tablet Take 200 mg by mouth daily.   levonorgestrel (MIRENA, 52 MG,) 20 MCG/DAY IUD Mirena 21 mcg/24 hours (8 yrs) 52 mg intrauterine device  Take 1 device by intrauterine route.   levothyroxine (SYNTHROID) 25 MCG tablet 1 tab daily p.o. on an empty stomach x6 days a week, 2 tabs p.o. 1 day a week.   lithium carbonate (LITHOBID) 300 MG CR tablet Take 900 mg by mouth daily.   sertraline (ZOLOFT) 100 MG tablet Take 150 mg by mouth daily.   SUMAtriptan (IMITREX) 100 MG  tablet Take 1 tablet earliest onset of migraine.  May repeat in 2 hours. Maximum 2 tablets in 24 hours.   traZODone (DESYREL) 100 MG tablet Take 100 mg by mouth at bedtime.   No facility-administered encounter medications on file as of 01/10/2022.    Allergies: Allergies  Allergen Reactions   Almond (Diagnostic) Anaphylaxis   Codeine Other (See Comments)    Makes patient aggressive     Hydrocodone Other (See Comments)    Makes patient aggressive     Family History: Family History  Problem Relation Age of Onset   Depression Mother    Mental illness Mother    Diabetes Brother    Drug abuse Brother    Early death Brother    Mental illness Brother    Breast cancer Maternal Grandmother 68   Cancer Maternal Grandfather        head neck    Diabetes Maternal Grandfather    Cancer Paternal Grandmother        gallbladder   Colon cancer Paternal Grandfather 27   Diabetes Paternal Grandfather     Observations/Objective:   No acute distress.  Alert and oriented.  Speech fluent and not dysarthric.  Language intact.    Follow Up  Instructions:    -I discussed the assessment and treatment plan with the patient. The patient was provided an opportunity to ask questions and all were answered. The patient agreed with the plan and demonstrated an understanding of the instructions.   The patient was advised to call back or seek an in-person evaluation if the symptoms worsen or if the condition fails to improve as anticipated.   Cira Servant, DO

## 2022-01-10 ENCOUNTER — Telehealth (INDEPENDENT_AMBULATORY_CARE_PROVIDER_SITE_OTHER): Payer: BC Managed Care – PPO | Admitting: Neurology

## 2022-01-10 ENCOUNTER — Encounter: Payer: Self-pay | Admitting: Neurology

## 2022-01-10 VITALS — Ht 64.0 in | Wt 215.0 lb

## 2022-01-10 DIAGNOSIS — G43829 Menstrual migraine, not intractable, without status migrainosus: Secondary | ICD-10-CM | POA: Diagnosis not present

## 2022-01-10 DIAGNOSIS — G250 Essential tremor: Secondary | ICD-10-CM | POA: Diagnosis not present

## 2022-01-10 MED ORDER — SUMATRIPTAN SUCCINATE 100 MG PO TABS: ORAL_TABLET | ORAL | 5 refills | Status: DC

## 2022-01-18 DIAGNOSIS — F411 Generalized anxiety disorder: Secondary | ICD-10-CM | POA: Diagnosis not present

## 2022-01-18 DIAGNOSIS — F3111 Bipolar disorder, current episode manic without psychotic features, mild: Secondary | ICD-10-CM | POA: Diagnosis not present

## 2022-01-23 DIAGNOSIS — F3132 Bipolar disorder, current episode depressed, moderate: Secondary | ICD-10-CM | POA: Diagnosis not present

## 2022-01-28 ENCOUNTER — Other Ambulatory Visit: Payer: Self-pay

## 2022-01-28 ENCOUNTER — Emergency Department (HOSPITAL_BASED_OUTPATIENT_CLINIC_OR_DEPARTMENT_OTHER)
Admission: EM | Admit: 2022-01-28 | Discharge: 2022-01-28 | Disposition: A | Discharge status: Home / Self Care | Payer: BC Managed Care – PPO | Attending: Emergency Medicine | Admitting: Emergency Medicine

## 2022-01-28 ENCOUNTER — Encounter (HOSPITAL_BASED_OUTPATIENT_CLINIC_OR_DEPARTMENT_OTHER): Payer: Self-pay

## 2022-01-28 ENCOUNTER — Emergency Department (HOSPITAL_BASED_OUTPATIENT_CLINIC_OR_DEPARTMENT_OTHER): Payer: BC Managed Care – PPO

## 2022-01-28 DIAGNOSIS — R1013 Epigastric pain: Secondary | ICD-10-CM

## 2022-01-28 DIAGNOSIS — R110 Nausea: Secondary | ICD-10-CM | POA: Diagnosis not present

## 2022-01-28 DIAGNOSIS — R1084 Generalized abdominal pain: Secondary | ICD-10-CM | POA: Diagnosis not present

## 2022-01-28 DIAGNOSIS — K5900 Constipation, unspecified: Secondary | ICD-10-CM | POA: Diagnosis not present

## 2022-01-28 DIAGNOSIS — D7289 Other specified disorders of white blood cells: Secondary | ICD-10-CM | POA: Diagnosis not present

## 2022-01-28 DIAGNOSIS — D72829 Elevated white blood cell count, unspecified: Secondary | ICD-10-CM | POA: Insufficient documentation

## 2022-01-28 DIAGNOSIS — N83201 Unspecified ovarian cyst, right side: Secondary | ICD-10-CM | POA: Insufficient documentation

## 2022-01-28 DIAGNOSIS — R1033 Periumbilical pain: Secondary | ICD-10-CM | POA: Diagnosis not present

## 2022-01-28 LAB — COMPREHENSIVE METABOLIC PANEL
ALT: 11 U/L (ref 0–44)
AST: 13 U/L — ABNORMAL LOW (ref 15–41)
Albumin: 4.8 g/dL (ref 3.5–5.0)
Alkaline Phosphatase: 67 U/L (ref 38–126)
Anion gap: 10 (ref 5–15)
BUN: 10 mg/dL (ref 6–20)
CO2: 22 mmol/L (ref 22–32)
Calcium: 9.5 mg/dL (ref 8.9–10.3)
Chloride: 105 mmol/L (ref 98–111)
Creatinine, Ser: 0.79 mg/dL (ref 0.44–1.00)
GFR, Estimated: >60 mL/min (ref >60)
Glucose, Bld: 108 mg/dL — ABNORMAL HIGH (ref 70–99)
Potassium: 3.7 mmol/L (ref 3.5–5.1)
Sodium: 137 mmol/L (ref 135–145)
Total Bilirubin: 0.8 mg/dL (ref 0.3–1.2)
Total Protein: 7.6 g/dL (ref 6.5–8.1)

## 2022-01-28 LAB — CBC WITH DIFFERENTIAL/PLATELET
Abs Immature Granulocytes: 0.08 10*3/uL — ABNORMAL HIGH (ref 0.00–0.07)
Basophils Absolute: 0 10*3/uL (ref 0.0–0.1)
Basophils Relative: 0 %
Eosinophils Absolute: 0.1 10*3/uL (ref 0.0–0.5)
Eosinophils Relative: 1 %
HCT: 38.9 % (ref 36.0–46.0)
Hemoglobin: 12.5 g/dL (ref 12.0–15.0)
Immature Granulocytes: 1 %
Lymphocytes Relative: 11 %
Lymphs Abs: 1.4 10*3/uL (ref 0.7–4.0)
MCH: 27.2 pg (ref 26.0–34.0)
MCHC: 32.1 g/dL (ref 30.0–36.0)
MCV: 84.6 fL (ref 80.0–100.0)
Monocytes Absolute: 1 10*3/uL (ref 0.1–1.0)
Monocytes Relative: 8 %
Neutro Abs: 10.4 10*3/uL — ABNORMAL HIGH (ref 1.7–7.7)
Neutrophils Relative %: 79 %
Platelets: 418 10*3/uL — ABNORMAL HIGH (ref 150–400)
RBC: 4.6 MIL/uL (ref 3.87–5.11)
RDW: 15.9 % — ABNORMAL HIGH (ref 11.5–15.5)
WBC: 13 10*3/uL — ABNORMAL HIGH (ref 4.0–10.5)
nRBC: 0 % (ref 0.0–0.2)

## 2022-01-28 LAB — URINALYSIS, ROUTINE W REFLEX MICROSCOPIC
Bilirubin Urine: NEGATIVE
Glucose, UA: NEGATIVE mg/dL
Ketones, ur: NEGATIVE mg/dL
Leukocytes,Ua: NEGATIVE
Nitrite: NEGATIVE
Protein, ur: 30 mg/dL — AB
Specific Gravity, Urine: 1.025 (ref 1.005–1.030)
pH: 6.5 (ref 5.0–8.0)

## 2022-01-28 LAB — LIPASE, BLOOD: Lipase: <10 U/L — ABNORMAL LOW (ref 11–51)

## 2022-01-28 LAB — PREGNANCY, URINE: Preg Test, Ur: NEGATIVE

## 2022-01-28 MED ORDER — KETOROLAC TROMETHAMINE 30 MG/ML IJ SOLN
30.0000 mg | Freq: Once | INTRAMUSCULAR | Status: AC
  Administered 2022-01-28: 30 mg via INTRAVENOUS

## 2022-01-28 MED ORDER — ONDANSETRON HCL 4 MG/2ML IJ SOLN
4.0000 mg | Freq: Once | INTRAMUSCULAR | Status: AC
  Administered 2022-01-28: 4 mg via INTRAVENOUS
  Filled 2022-01-28: qty 2

## 2022-01-28 MED ORDER — SODIUM CHLORIDE 0.9 % IV BOLUS
1000.0000 mL | Freq: Once | INTRAVENOUS | Status: AC
  Administered 2022-01-28: 1000 mL via INTRAVENOUS

## 2022-01-28 MED ORDER — DICYCLOMINE HCL 20 MG PO TABS: 20.0000 mg | ORAL_TABLET | Freq: Two times a day (BID) | ORAL | 0 refills | Status: DC

## 2022-01-28 MED ORDER — FENTANYL CITRATE PF 50 MCG/ML IJ SOSY
50.0000 ug | PREFILLED_SYRINGE | Freq: Once | INTRAMUSCULAR | Status: AC
  Administered 2022-01-28: 50 ug via INTRAVENOUS
  Filled 2022-01-28: qty 1

## 2022-01-28 MED ORDER — IOHEXOL 300 MG/ML  SOLN
100.0000 mL | Freq: Once | INTRAMUSCULAR | Status: AC | PRN
  Administered 2022-01-28: 80 mL via INTRAVENOUS

## 2022-01-28 NOTE — ED Notes (Signed)
Pt verbalizes understanding of discharge instructions. Opportunity for questioning and answers were provided. Pt discharged from ED to home with family.    

## 2022-01-28 NOTE — ED Notes (Signed)
Pt gone to CT 

## 2022-01-28 NOTE — ED Provider Notes (Signed)
[facility] EMERGENCY DEPT  Provider Note  CSN: [id number] Arrival date & time: 01/28/22 0345  History Chief Complaint  Patient presents with   Abdominal Pain    Sheila Mathews is a 46 y.o. female reports 5 days of constant but gradually worsening diffuse abdominal pains, associated with nausea but no vomiting. She thought she might be constipated but symptoms persistent after having BM. No hematochezia or melena, no dysuria or hematuria. Pain does not radiate to her back. No prior history of similar. No prior abdominal surgeries. No particular provoking or relieving factors.    Home Medications Prior to Admission medications   Medication Sig Start Date End Date Taking? Authorizing Provider  dicyclomine (BENTYL) 20 MG tablet Take 1 tablet (20 mg total) by mouth 2 (two) times daily. 01/28/22  Yes Pollyann Savoy, MD  ARIPiprazole (ABILIFY) 10 MG tablet Take 10 mg by mouth daily. Patient not taking: Reported on 01/10/2022 10/22/21   [provider]  ARIPiprazole (ABILIFY) 5 MG tablet Take 5 mg by mouth daily. Patient not taking: Reported on 01/10/2022    [provider]  celecoxib (CELEBREX) 200 MG capsule Take 200 mg by mouth 2 (two) times daily. 10/16/21   [provider]  clonazePAM (KLONOPIN) 0.5 MG tablet Take 0.5 mg by mouth 3 (three) times daily as needed for anxiety.    [provider]  gabapentin (NEURONTIN) 300 MG capsule Take 300 mg by mouth 3 (three) times daily. Patient not taking: Reported on 01/10/2022 08/04/21   [provider]  lamoTRIgine (LAMICTAL) 200 MG tablet Take 200 mg by mouth daily.    [provider]  levonorgestrel (MIRENA, 52 MG,) 20 MCG/DAY IUD Mirena 21 mcg/24 hours (8 yrs) 52 mg intrauterine device  Take 1 device by intrauterine route.    [provider]  levothyroxine (SYNTHROID) 25 MCG tablet 1 tab daily p.o. on an empty stomach x6 days a week, 2 tabs p.o. 1 day a week. 02/06/21    Kuneff, Renee A, DO  lithium carbonate (LITHOBID) 300 MG CR tablet Take 900 mg by mouth daily. 04/07/20   [provider]  sertraline (ZOLOFT) 100 MG tablet Take 150 mg by mouth daily.    [provider]  SUMAtriptan (IMITREX) 100 MG tablet Take 1 tablet earliest onset of migraine.  May repeat in 2 hours. Maximum 2 tablets in 24 hours. 01/10/22   Drema Dallas, DO  traZODone (DESYREL) 100 MG tablet Take 100 mg by mouth at bedtime. 09/13/21   [provider]     Allergies    Almond (diagnostic), Codeine, and Hydrocodone   Review of Systems   Review of Systems Please see HPI for pertinent positives and negatives  Physical Exam BP 120/63   Pulse 69   Temp 98.2 F (36.8 C) (Oral)   Resp 18   Ht 5\' 4"  (1.626 m)   Wt 97.5 kg   SpO2 98%   BMI 36.90 kg/m   Physical Exam Vitals and nursing note reviewed.  Constitutional:      Appearance: Normal appearance.  HENT:     Head: Normocephalic and atraumatic.     Nose: Nose normal.     Mouth/Throat:     Mouth: Mucous membranes are moist.  Eyes:     Extraocular Movements: Extraocular movements intact.     Conjunctiva/sclera: Conjunctivae normal.  Cardiovascular:     Rate and Rhythm: Normal rate.  Pulmonary:     Effort: Pulmonary effort is normal.  Breath sounds: Normal breath sounds.  Abdominal:     General: Abdomen is flat.     Palpations: Abdomen is soft.     Tenderness: There is abdominal tenderness in the right upper quadrant and epigastric area. There is no guarding. Negative signs include Murphy's sign and McBurney's sign.  Musculoskeletal:        General: No swelling. Normal range of motion.     Cervical back: Neck supple.  Skin:    General: Skin is warm and dry.  Neurological:     General: No focal deficit present.     Mental Status: She is alert.  Psychiatric:        Mood and Affect: Mood normal.     ED Results / Procedures / Treatments    EKG None  Procedures Procedures  Medications Ordered in the ED Medications  fentaNYL (SUBLIMAZE) injection 50 mcg (50 mcg Intravenous Given 01/28/22 0435)  ondansetron (ZOFRAN) injection 4 mg (4 mg Intravenous Given 01/28/22 0435)  sodium chloride 0.9 % bolus 1,000 mL (0 mLs Intravenous Stopped 01/28/22 0620)  iohexol (OMNIPAQUE) 300 MG/ML solution 100 mL (80 mLs Intravenous Contrast Given 01/28/22 0549)  ketorolac (TORADOL) 30 MG/ML injection 30 mg (30 mg Intravenous Given 01/28/22 0620)    Initial Impression and Plan  Patient with nonspecific abdominal pain. No peritoneal signs on exam. Most tender in epigastric and RUQ. Will check labs for signs of biliary obstruction/infection and send for CT as [place] is not available at this time. Pain/nausea meds, IVF for comfort.   ED Course   Clinical Course as of 01/28/22 0637  Mon Jan 28, 2022  0508 CBC with leukocytosis.  [CS]  0524 HCG is neg.  [CS]  0525 UA is unremarkable.  [CS]  0530 CMP and Lipase unremarkable.  [CS]  [phone] Patient still having significant pain. Will give a dose of Toradol while awaiting CT results.  [CS]  [phone] I personally viewed the images from radiology studies and agree with radiologist interpretation: CT does not show any specific cause of her pain. Ovarian cyst is not in the location of her symptoms. Discussed other incidental findings with the patient. Plan discharge with Rx for bentyl. She sees Dr. Elnoria Howard for IBS, recommend she make an appointment for follow up if not improving. RTED for any other concerns.   [CS]    Clinical Course User Index [CS] Pollyann Savoy, MD     MDM Rules/Calculators/A&P Medical Decision Making Problems Addressed: Cyst of right ovary: acute illness or injury Epigastric pain: acute illness or injury  Amount and/or Complexity of Data Reviewed Labs: ordered. Decision-making details documented in ED Course. Radiology: ordered and independent interpretation performed. Decision-making  details documented in ED Course. ECG/medicine tests: ordered and independent interpretation performed. Decision-making details documented in ED Course.  Risk Prescription drug management.    Final Clinical Impression(s) / ED Diagnoses Final diagnoses:  Epigastric pain  Cyst of right ovary    Rx / DC Orders ED Discharge Orders          Ordered    dicyclomine (BENTYL) 20 MG tablet  2 times daily        01/28/22 2330             Pollyann Savoy, MD 01/28/22 [phone]

## 2022-01-28 NOTE — ED Triage Notes (Signed)
Pt presents to the ED with abdominal pain x5 days. Reports nausea, no vomiting. Constipation with last BM being yesterday after taking laxative. States that last BM before that was 4 days prior.

## 2022-02-06 ENCOUNTER — Encounter: Payer: Self-pay | Admitting: Family Medicine

## 2022-02-20 ENCOUNTER — Telehealth: Payer: BC Managed Care – PPO | Admitting: Neurology

## 2022-02-27 DIAGNOSIS — R197 Diarrhea, unspecified: Secondary | ICD-10-CM | POA: Diagnosis not present

## 2022-02-27 DIAGNOSIS — R1084 Generalized abdominal pain: Secondary | ICD-10-CM | POA: Diagnosis not present

## 2022-02-27 DIAGNOSIS — R110 Nausea: Secondary | ICD-10-CM | POA: Diagnosis not present

## 2022-02-28 DIAGNOSIS — R197 Diarrhea, unspecified: Secondary | ICD-10-CM | POA: Diagnosis not present

## 2022-02-28 DIAGNOSIS — A020 Salmonella enteritis: Secondary | ICD-10-CM | POA: Diagnosis not present

## 2022-02-28 DIAGNOSIS — R1084 Generalized abdominal pain: Secondary | ICD-10-CM | POA: Diagnosis not present

## 2022-03-05 DIAGNOSIS — K529 Noninfective gastroenteritis and colitis, unspecified: Secondary | ICD-10-CM | POA: Diagnosis not present

## 2022-03-05 DIAGNOSIS — R110 Nausea: Secondary | ICD-10-CM | POA: Diagnosis not present

## 2022-03-05 DIAGNOSIS — R197 Diarrhea, unspecified: Secondary | ICD-10-CM | POA: Diagnosis not present

## 2022-03-05 DIAGNOSIS — R1084 Generalized abdominal pain: Secondary | ICD-10-CM | POA: Diagnosis not present

## 2022-03-05 DIAGNOSIS — R112 Nausea with vomiting, unspecified: Secondary | ICD-10-CM | POA: Diagnosis not present

## 2022-03-05 LAB — HM COLONOSCOPY

## 2022-03-07 ENCOUNTER — Institutional Professional Consult (permissible substitution): Payer: BC Managed Care – PPO | Admitting: Neurology

## 2022-03-13 ENCOUNTER — Ambulatory Visit (INDEPENDENT_AMBULATORY_CARE_PROVIDER_SITE_OTHER): Payer: BC Managed Care – PPO | Admitting: Family Medicine

## 2022-03-13 ENCOUNTER — Other Ambulatory Visit: Payer: BC Managed Care – PPO

## 2022-03-13 ENCOUNTER — Encounter: Payer: Self-pay | Admitting: Family Medicine

## 2022-03-13 VITALS — BP 112/77 | HR 66 | Temp 97.3°F | Wt 212.2 lb

## 2022-03-13 DIAGNOSIS — R5383 Other fatigue: Secondary | ICD-10-CM

## 2022-03-13 DIAGNOSIS — E039 Hypothyroidism, unspecified: Secondary | ICD-10-CM | POA: Diagnosis not present

## 2022-03-13 DIAGNOSIS — L679 Hair color and hair shaft abnormality, unspecified: Secondary | ICD-10-CM | POA: Diagnosis not present

## 2022-03-13 DIAGNOSIS — Z23 Encounter for immunization: Secondary | ICD-10-CM | POA: Diagnosis not present

## 2022-03-13 NOTE — Progress Notes (Signed)
Sheila Mathews , 1976-02-03, 46 y.o., female MRN: [id number] Patient Care Team    Relationship Specialty Notifications Start End  Natalia Leatherwood, DO PCP - General Family Medicine  05/05/20   Harold Hedge, MD Consulting Physician Obstetrics and Gynecology  02/05/21   Drema Dallas, DO Consulting Physician Neurology  02/05/21   Jeani Hawking, MD Consulting Physician Gastroenterology  02/05/21   Venita Lick, MD Consulting Physician Orthopedic Surgery  02/05/21     Chief Complaint  Patient presents with   Thyroid Problem    Last lab beginning of July. Fatigue and a little weight gain.     Subjective: Pt presents for an OV with complaints of increased fatigue, brittle hair and weight gain.  She does not take a multivitamin.  She has a history of a thyroid disorder and is compliant with levothyroxine 25 mcg daily x6 days and 100 mcg x 1 day weekly. He is prescribed lithium by her psychiatry team.  They completed lithium levels and thyroid recently and her thyroid levels are mildly elevated around the 6 range.     05/05/2020    9:26 AM  Depression screen PHQ 2/9  Decreased Interest 1  Down, Depressed, Hopeless 1  PHQ - 2 Score 2  Altered sleeping 3  Tired, decreased energy 1  Change in appetite 3  Feeling bad or failure about yourself  1  Trouble concentrating 0  Moving slowly or fidgety/restless 0  Suicidal thoughts 0  PHQ-9 Score 10    Allergies  Allergen Reactions   Almond (Diagnostic) Anaphylaxis   Codeine Other (See Comments)    Makes patient aggressive     Hydrocodone Other (See Comments)    Makes patient aggressive    Social History   Social History Narrative   Marital status/children/pets: Married, 3 children.    Education/employment: Oncologist, healthcare advocate   Safety:      -smoke alarm in the home:Yes     - wears seatbelt: Yes     - Feels safe in their relationships: Yes   Right handed   No caffeine   One story home   Past Medical  History:  Diagnosis Date   Abnormal cervical Papanicolaou smear 04/20/2020   Allergies    Anxiety    Bipolar disorder ([facility])    Chicken pox    Chronic sinusitis 07/26/2016   Complication of anesthesia    pt doesn't like mask she has panic attack; childhood   Depression    Family history of adverse reaction to anesthesia    Mother PONV   GERD (gastroesophageal reflux disease)    History of anemia    during pregnancy   Migraines    Pelvic pain in female 05/13/2014   Pneumonia    PONV (postoperative nausea and vomiting)    no issues with short procdures, had one isntance of vomitus following a longer procedure    Sciatica    Thyroid disease    Past Surgical History:  Procedure Laterality Date   ABDOMINAL EXPOSURE N/A 09/21/2020   Procedure: LATERAL EXPOSURE FOR OBLIQUE LATERAL SPINE SURGERY;  Surgeon: Cephus Shelling, MD;  Location: [facility] OR;  Service: Vascular;  Laterality: N/A;   ANTERIOR LATERAL LUMBAR FUSION WITH PERCUTANEOUS SCREW 1 LEVEL N/A 09/21/2020   Procedure: OBLIQUE LUMBAR INTERBODY FUSION LUMBAR FOUR THROUGH FIVE, POSTERIOR SPINAL FUSION INSTRUMENTATION LUMAR FOUR THROUGH FIVE;  Surgeon: Venita Lick, MD;  Location: [facility] OR;  Service: Orthopedics;  Laterality: N/A;  CESAREAN SECTION  x2   2000, 2005   LAPAROSCOPY N/A 05/13/2014   Procedure: LAPAROSCOPY DIAGNOSTIC;  Surgeon: Lavina Hamman, MD;  Location: [facility] ORS;  Service: Gynecology;  Laterality: N/A;   LUMBAR LAMINECTOMY/DECOMPRESSION MICRODISCECTOMY Left 02/12/2017   Procedure: Microlumbar decompression L5-S1 left ;  Surgeon: Jene Every, MD;  Location: [facility] ORS;  Service: Orthopedics;  Laterality: Left;  90 mins   SALIVARY GLAND SURGERY  1986   UPPER GI ENDOSCOPY     WISDOM TOOTH EXTRACTION     Family History  Problem Relation Age of Onset   Depression Mother    Mental illness Mother    Diabetes Brother    Drug abuse Brother    Early death Brother    Mental illness Brother    Breast cancer Maternal  Grandmother 30   Cancer Maternal Grandfather        head neck    Diabetes Maternal Grandfather    Cancer Paternal Grandmother        gallbladder   Colon cancer Paternal Grandfather 40   Diabetes Paternal Grandfather    Allergies as of 03/13/2022       Reactions   Almond (diagnostic) Anaphylaxis   Codeine Other (See Comments)   Makes patient aggressive    Hydrocodone Other (See Comments)   Makes patient aggressive         Medication List        Accurate as of March 13, 2022  3:50 PM. If you have any questions, ask your nurse or doctor.          STOP taking these medications    ARIPiprazole 10 MG tablet Commonly known as: ABILIFY Stopped by: Felix Pacini, DO   ARIPiprazole 5 MG tablet Commonly known as: ABILIFY Stopped by: Felix Pacini, DO   celecoxib 200 MG capsule Commonly known as: CELEBREX Stopped by: Felix Pacini, DO   dicyclomine 20 MG tablet Commonly known as: BENTYL Stopped by: Felix Pacini, DO   gabapentin 300 MG capsule Commonly known as: NEURONTIN Stopped by: Felix Pacini, DO       TAKE these medications    clonazePAM 0.5 MG tablet Commonly known as: KLONOPIN Take 0.5 mg by mouth 3 (three) times daily as needed for anxiety.   lamoTRIgine 200 MG tablet Commonly known as: LAMICTAL Take 200 mg by mouth daily.   levothyroxine 25 MCG tablet Commonly known as: SYNTHROID 1 tab daily p.o. on an empty stomach x6 days a week, 2 tabs p.o. 1 day a week.   lithium carbonate 300 MG CR tablet Commonly known as: LITHOBID Take 900 mg by mouth daily.   Mirena (52 MG) 20 MCG/DAY Iud Generic drug: levonorgestrel Mirena 21 mcg/24 hours (8 yrs) 52 mg intrauterine device  Take 1 device by intrauterine route.   sertraline 100 MG tablet Commonly known as: ZOLOFT Take 50 mg by mouth daily.   SUMAtriptan 100 MG tablet Commonly known as: IMITREX Take 1 tablet earliest onset of migraine.  May repeat in 2 hours. Maximum 2 tablets in 24 hours.    traZODone 100 MG tablet Commonly known as: DESYREL Take 100 mg by mouth at bedtime.        All past medical history, surgical history, allergies, family history, immunizations andmedications were updated in the EMR today and reviewed under the history and medication portions of their EMR.     ROS Negative, with the exception of above mentioned in HPI   Objective:  BP 112/77   Pulse 66  Temp (!) 97.3 F (36.3 C)   Wt 212 lb 3.2 oz (96.3 kg)   LMP 03/07/2022 (Approximate)   SpO2 97%   BMI 36.42 kg/m  Body mass index is 36.42 kg/m. Physical Exam Vitals and nursing note reviewed.  Constitutional:      General: She is not in acute distress.    Appearance: Normal appearance. She is normal weight. She is not ill-appearing or toxic-appearing.  Eyes:     Extraocular Movements: Extraocular movements intact.     Conjunctiva/sclera: Conjunctivae normal.     Pupils: Pupils are equal, round, and reactive to light.  Neck:     Comments: No thyromegaly Cardiovascular:     Rate and Rhythm: Normal rate.  Pulmonary:     Effort: Pulmonary effort is normal.  Musculoskeletal:     Cervical back: Neck supple. No tenderness.  Skin:    Findings: No rash.  Neurological:     Mental Status: She is alert and oriented to person, place, and time. Mental status is at baseline.  Psychiatric:        Mood and Affect: Mood normal.        Behavior: Behavior normal.        Thought Content: Thought content normal.        Judgment: Judgment normal.      No results found. No results found. No results found for this or any previous visit (from the past 24 hour(s)).  Assessment/Plan: Sheila Mathews is a 46 y.o. female present for OV for  hypothyroidism She has been compliant with levothyroxine 25 mcg x 6 days and 50 mcg x 1 day weekly.  Recent labs at her psychiatry office showed an elevation in her TSH.  We will repeat labs today and adjust medications accordingly. - T4, free -  TSH  Fatigue/Hair changes Check her thyroid levels today as well as vitamin D, B12 and iron for cause. - Vitamin D (25 hydroxy) - B12 - Iron, TIBC and Ferritin Panel  Need for immunization against influenza - Flu Vaccine QUAD 59mo+IM (Fluarix, Fluzone & Alfiuria Quad PF)   Reviewed expectations re: course of current medical issues. Discussed self-management of symptoms. Outlined signs and symptoms indicating need for more acute intervention. Patient verbalized understanding and all questions were answered. Patient received an After-Visit Summary.    Orders Placed This Encounter  Procedures   Flu Vaccine QUAD 81mo+IM (Fluarix, Fluzone & Alfiuria Quad PF)   T4, free   TSH   Vitamin D (25 hydroxy)   B12   Iron, TIBC and Ferritin Panel   No orders of the defined types were placed in this encounter.  Referral Orders  No referral(s) requested today     Note is dictated utilizing voice recognition software. Although note has been proof read prior to signing, occasional typographical errors still can be missed. If any questions arise, please do not hesitate to call for verification.   electronically signed by:  Felix Pacini, DO  [facility] - OR

## 2022-03-13 NOTE — Patient Instructions (Signed)

## 2022-03-14 ENCOUNTER — Telehealth: Payer: Self-pay | Admitting: Family Medicine

## 2022-03-14 ENCOUNTER — Encounter: Payer: Self-pay | Admitting: Family Medicine

## 2022-03-14 DIAGNOSIS — E538 Deficiency of other specified B group vitamins: Secondary | ICD-10-CM | POA: Insufficient documentation

## 2022-03-14 DIAGNOSIS — E611 Iron deficiency: Secondary | ICD-10-CM

## 2022-03-14 DIAGNOSIS — E559 Vitamin D deficiency, unspecified: Secondary | ICD-10-CM

## 2022-03-14 LAB — IRON,TIBC AND FERRITIN PANEL
%SAT: 9 % (calc) — ABNORMAL LOW (ref 16–45)
Ferritin: 22 ng/mL (ref 16–232)
Iron: 38 ug/dL — ABNORMAL LOW (ref 40–190)
TIBC: 432 mcg/dL (calc) (ref 250–450)

## 2022-03-14 LAB — VITAMIN B12: Vitamin B-12: 331 pg/mL (ref 200–1100)

## 2022-03-14 LAB — TSH: TSH: 2.68 mIU/L

## 2022-03-14 LAB — VITAMIN D 25 HYDROXY (VIT D DEFICIENCY, FRACTURES): Vit D, 25-Hydroxy: 18 ng/mL — ABNORMAL LOW (ref 30–100)

## 2022-03-14 LAB — T4, FREE: Free T4: 0.9 ng/dL (ref 0.8–1.8)

## 2022-03-14 MED ORDER — VITAMIN D (ERGOCALCIFEROL) 1.25 MG (50000 UNIT) PO CAPS: 50000.0000 [IU] | ORAL_CAPSULE | ORAL | 0 refills | Status: DC

## 2022-03-14 MED ORDER — LEVOTHYROXINE SODIUM 25 MCG PO TABS: ORAL_TABLET | ORAL | 3 refills | Status: AC

## 2022-03-14 NOTE — Telephone Encounter (Signed)
Spoke with pt regarding labs and instructions.   

## 2022-03-14 NOTE — Telephone Encounter (Signed)
Please call patient: Her vitamin D is very low at 18.  This will cause changes in hair and cause fatigue.  Goal vitamin D is between 40-50 ideally. Her B12 is also low.  This can cause nerve discomfort, paresthesias and fatigue.  Her B12 is 331.  People start to have symptoms of B12 deficiency typically around 400.  Goal B12 level is above 750 ideally.  Her iron stores are normal, I am waiting on the rest of her panel to result.  We will call her if this is abnormal. Her thyroid panel is actually perfect.  Her TSH is 2.68.  T4 free is normal.   I have called in a high-dose vitamin D supplement to take once weekly with food. I encouraged her to start an over-the-counter B12 supplement of 1000 mcg sublingual daily.  Must be the sublingual version for best absorption-must be held under the tongue for 30 seconds or more.  Follow-up in 10-12 weeks on deficiencies.

## 2022-03-14 NOTE — Telephone Encounter (Signed)
Her blood iron levels and saturations are mildly low.  She would benefit from incorporating more iron rich foods in her diet. Good plant sources of iron are lentils, chickpeas, beans, tofu, cashew nuts, pumpkin seeds, kale, dried apricots and figs, raisins, quinoa and fortified breakfast cereal. We will retest this as well at her 45-month follow-up and if she is unable to bring her iron up naturally by dietary sources, then we can consider an iron supplementation.  Would like to try to avoid that for now especially with her history of constipation, since all supplements can make people feel more constipated.

## 2022-03-15 ENCOUNTER — Telehealth (INDEPENDENT_AMBULATORY_CARE_PROVIDER_SITE_OTHER): Payer: BC Managed Care – PPO | Admitting: Family Medicine

## 2022-03-15 ENCOUNTER — Encounter: Payer: Self-pay | Admitting: Family Medicine

## 2022-03-15 DIAGNOSIS — F411 Generalized anxiety disorder: Secondary | ICD-10-CM | POA: Diagnosis not present

## 2022-03-15 DIAGNOSIS — F3111 Bipolar disorder, current episode manic without psychotic features, mild: Secondary | ICD-10-CM | POA: Diagnosis not present

## 2022-03-15 DIAGNOSIS — H1013 Acute atopic conjunctivitis, bilateral: Secondary | ICD-10-CM | POA: Diagnosis not present

## 2022-03-15 MED ORDER — KETOROLAC TROMETHAMINE 0.4 % OP SOLN: 1.0000 [drp] | Freq: Four times a day (QID) | OPHTHALMIC | 5 refills | Status: DC

## 2022-03-15 MED ORDER — OLOPATADINE HCL 0.2 % OP SOLN: 1.0000 [drp] | Freq: Every day | OPHTHALMIC | 5 refills | Status: DC

## 2022-03-15 NOTE — Progress Notes (Signed)
VIRTUAL VISIT VIA VIDEO  I connected with Sheila Mathews on 03/15/22 at  3:20 PM EDT by a video enabled telemedicine application and verified that I am speaking with the correct person using two identifiers. Location patient: Home Location provider: [facility], Office Persons participating in the virtual visit: Patient, Dr. Claiborne Billings and Les Pou, CMA  I discussed the limitations of evaluation and management by telemedicine and the availability of in person appointments. The patient expressed understanding and agreed to proceed.     Sheila Mathews , Apr 25, 1976, 46 y.o., female MRN: [id number] Patient Care Team    Relationship Specialty Notifications Start End  Natalia Leatherwood, DO PCP - General Family Medicine  05/05/20   Harold Hedge, MD Consulting Physician Obstetrics and Gynecology  02/05/21   Drema Dallas, DO Consulting Physician Neurology  02/05/21   Jeani Hawking, MD Consulting Physician Gastroenterology  02/05/21   Venita Lick, MD Consulting Physician Orthopedic Surgery  02/05/21     Chief Complaint  Patient presents with   Dry Eye    Pt c/o dry itchy eyes with drainage L> x 3 mos;      Subjective: Pt presents for an OV with complaints of dry itchy eyes.  She has tried over-the-counter hydration and allergy medications without relief.  She is taking Flonase nasal spray daily. She denies any drainage from her eyes.     05/05/2020    9:26 AM  Depression screen PHQ 2/9  Decreased Interest 1  Down, Depressed, Hopeless 1  PHQ - 2 Score 2  Altered sleeping 3  Tired, decreased energy 1  Change in appetite 3  Feeling bad or failure about yourself  1  Trouble concentrating 0  Moving slowly or fidgety/restless 0  Suicidal thoughts 0  PHQ-9 Score 10    Allergies  Allergen Reactions   Almond (Diagnostic) Anaphylaxis   Codeine Other (See Comments)    Makes patient aggressive     Hydrocodone Other (See Comments)    Makes patient aggressive    Social  History   Social History Narrative   Marital status/children/pets: Married, 3 children.    Education/employment: Oncologist, healthcare advocate   Safety:      -smoke alarm in the home:Yes     - wears seatbelt: Yes     - Feels safe in their relationships: Yes   Right handed   No caffeine   One story home   Past Medical History:  Diagnosis Date   Abnormal cervical Papanicolaou smear 04/20/2020   Allergies    Anxiety    Bipolar disorder (HCC)    Chicken pox    Chronic sinusitis 07/26/2016   Complication of anesthesia    pt doesn't like mask she has panic attack; childhood   Depression    Family history of adverse reaction to anesthesia    Mother PONV   GERD (gastroesophageal reflux disease)    History of anemia    during pregnancy   Migraines    Pelvic pain in female 05/13/2014   Pneumonia    PONV (postoperative nausea and vomiting)    no issues with short procdures, had one isntance of vomitus following a longer procedure    Sciatica    Thyroid disease    Past Surgical History:  Procedure Laterality Date   ABDOMINAL EXPOSURE N/A 09/21/2020   Procedure: LATERAL EXPOSURE FOR OBLIQUE LATERAL SPINE SURGERY;  Surgeon: Cephus Shelling, MD;  Location: [facility] OR;  Service: Vascular;  Laterality: N/A;   ANTERIOR LATERAL LUMBAR FUSION WITH PERCUTANEOUS SCREW 1 LEVEL N/A 09/21/2020   Procedure: OBLIQUE LUMBAR INTERBODY FUSION LUMBAR FOUR THROUGH FIVE, POSTERIOR SPINAL FUSION INSTRUMENTATION LUMAR FOUR THROUGH FIVE;  Surgeon: Venita Lick, MD;  Location: [facility] OR;  Service: Orthopedics;  Laterality: N/A;   CESAREAN SECTION  x2   2000, 2005   LAPAROSCOPY N/A 05/13/2014   Procedure: LAPAROSCOPY DIAGNOSTIC;  Surgeon: Lavina Hamman, MD;  Location: [facility];  Service: Gynecology;  Laterality: N/A;   LUMBAR LAMINECTOMY/DECOMPRESSION MICRODISCECTOMY Left 02/12/2017   Procedure: Microlumbar decompression L5-S1 left ;  Surgeon: Jene Every, MD;  Location: [facility];  Service: Orthopedics;   Laterality: Left;  90 mins   SALIVARY GLAND SURGERY  1986   UPPER GI ENDOSCOPY     WISDOM TOOTH EXTRACTION     Family History  Problem Relation Age of Onset   Depression Mother    Mental illness Mother    Diabetes Brother    Drug abuse Brother    Early death Brother    Mental illness Brother    Breast cancer Maternal Grandmother 30   Cancer Maternal Grandfather        head neck    Diabetes Maternal Grandfather    Cancer Paternal Grandmother        gallbladder   Colon cancer Paternal Grandfather 78   Diabetes Paternal Grandfather    Allergies as of 03/15/2022       Reactions   Almond (diagnostic) Anaphylaxis   Codeine Other (See Comments)   Makes patient aggressive    Hydrocodone Other (See Comments)   Makes patient aggressive         Medication List        Accurate as of March 15, 2022  4:28 PM. If you have any questions, ask your nurse or doctor.          clonazePAM 0.5 MG tablet Commonly known as: KLONOPIN Take 0.5 mg by mouth 3 (three) times daily as needed for anxiety.   fluticasone 50 MCG/ACT nasal spray Commonly known as: FLONASE Place into both nostrils daily.   ketorolac 0.4 % Soln Commonly known as: ACULAR Place 1 drop into both eyes 4 (four) times daily. Started by: Felix Pacini, DO   lamoTRIgine 200 MG tablet Commonly known as: LAMICTAL Take 200 mg by mouth daily.   levothyroxine 25 MCG tablet Commonly known as: SYNTHROID 1 tab daily p.o. on an empty stomach x6 days a week, 2 tabs p.o. 1 day a week.   lithium carbonate 300 MG CR tablet Commonly known as: LITHOBID Take 900 mg by mouth daily.   Mirena (52 MG) 20 MCG/DAY Iud Generic drug: levonorgestrel Mirena 21 mcg/24 hours (8 yrs) 52 mg intrauterine device  Take 1 device by intrauterine route.   Olopatadine HCl 0.2 % Soln Apply 1 drop to eye daily. Started by: Felix Pacini, DO   sertraline 100 MG tablet Commonly known as: ZOLOFT Take 50 mg by mouth daily.   SUMAtriptan  100 MG tablet Commonly known as: IMITREX Take 1 tablet earliest onset of migraine.  May repeat in 2 hours. Maximum 2 tablets in 24 hours.   traZODone 100 MG tablet Commonly known as: DESYREL Take 100 mg by mouth at bedtime.   Vitamin D (Ergocalciferol) 1.25 MG (50000 UNIT) Caps capsule Commonly known as: DRISDOL Take 1 capsule (50,000 Units total) by mouth every 7 (seven) days.        All past medical history, surgical history, allergies, family history,  immunizations andmedications were updated in the EMR today and reviewed under the history and medication portions of their EMR.     ROS Negative, with the exception of above mentioned in HPI   Objective:  LMP 03/07/2022 (Approximate)  There is no height or weight on file to calculate BMI. Physical Exam Vitals and nursing note reviewed.  Constitutional:      General: She is not in acute distress.    Appearance: Normal appearance. She is normal weight. She is not ill-appearing or toxic-appearing.  Eyes:     General: No scleral icterus.       Right eye: No discharge.        Left eye: No discharge.     Extraocular Movements: Extraocular movements intact.     Conjunctiva/sclera: Conjunctivae normal.     Pupils: Pupils are equal, round, and reactive to light.  Neurological:     Mental Status: She is alert and oriented to person, place, and time. Mental status is at baseline.  Psychiatric:        Mood and Affect: Mood normal.        Behavior: Behavior normal.        Thought Content: Thought content normal.        Judgment: Judgment normal.      No results found. No results found. No results found for this or any previous visit (from the past 24 hour(s)).  Assessment/Plan: Sheila Mathews is a 46 y.o. female present for OV for  Allergic conjunctivitis of both eyes We will treat as allergic conjunctivitis. If the dryness of the eye continues she should be evaluated by her ophthalmologist. Since it is fairly new and it  is ragweed season, it is reasonable to start treatment with Pataday eyedrops and Toradol ophthalmic  Reviewed expectations re: course of current medical issues. Discussed self-management of symptoms. Outlined signs and symptoms indicating need for more acute intervention. Patient verbalized understanding and all questions were answered. Patient received an After-Visit Summary.    No orders of the defined types were placed in this encounter.  Meds ordered this encounter  Medications   Olopatadine HCl 0.2 % SOLN    Sig: Apply 1 drop to eye daily.    Dispense:  2.5 mL    Refill:  5   ketorolac (ACULAR) 0.4 % SOLN    Sig: Place 1 drop into both eyes 4 (four) times daily.    Dispense:  5 mL    Refill:  5   Referral Orders  No referral(s) requested today     Note is dictated utilizing voice recognition software. Although note has been proof read prior to signing, occasional typographical errors still can be missed. If any questions arise, please do not hesitate to call for verification.   electronically signed by:  Felix Pacini, DO  [facility] - OR

## 2022-03-20 ENCOUNTER — Encounter: Payer: Self-pay | Admitting: Family Medicine

## 2022-03-22 ENCOUNTER — Telehealth: Payer: Self-pay

## 2022-03-22 NOTE — Telephone Encounter (Signed)
Yes okay to change.

## 2022-03-22 NOTE — Telephone Encounter (Signed)
VO given to pharmacy

## 2022-03-22 NOTE — Telephone Encounter (Signed)
Patient had reaction to eye drops prescription - Olopatadine 2%.  Pharmacy was called about the 2nd prescription, because pharmacy does not have ketorolac (ACULAR) 0.4 % SOLN 0.4%, but they do have 0.5% in stock. Patient trying to get completed for weekend. Since Dr. Raoul Pitch, can Dr. Anitra Lauth change?  Please call

## 2022-03-22 NOTE — Telephone Encounter (Signed)
Please advise on change in pcp absence.

## 2022-03-25 MED ORDER — KETOROLAC TROMETHAMINE 0.5 % OP SOLN: 1.0000 [drp] | Freq: Four times a day (QID) | OPHTHALMIC | 3 refills | Status: DC

## 2022-03-25 NOTE — Telephone Encounter (Signed)
Changed prescription to the acute elect ophthalmic solution 0.5%

## 2022-03-25 NOTE — Telephone Encounter (Signed)
No further action needed.

## 2022-03-28 IMAGING — XA DG DISKOGRAPHY LUMBAR S+I
2 series · 2 of 2 positions shown · non-contrast
Comparison: none

CLINICAL DATA: Low back pain. Transitional lumbosacral anatomy with
discordant numbering schemes. Previous microdiscectomy, above the
transitional segment.

EXAM:
LUMBAR DISC  INJECTION L4-5
TECHNIQUE: The procedure, risks (including but not limited to bleeding,
infection, organ damage ), benefits, and alternatives were explained
to the patient. Questions regarding the procedure were encouraged
and answered. The patient understands and consents to the procedure.

[Series 1: ortho adipose · 1 of 1 slices shown (1 of 2)]
[im 1/1]
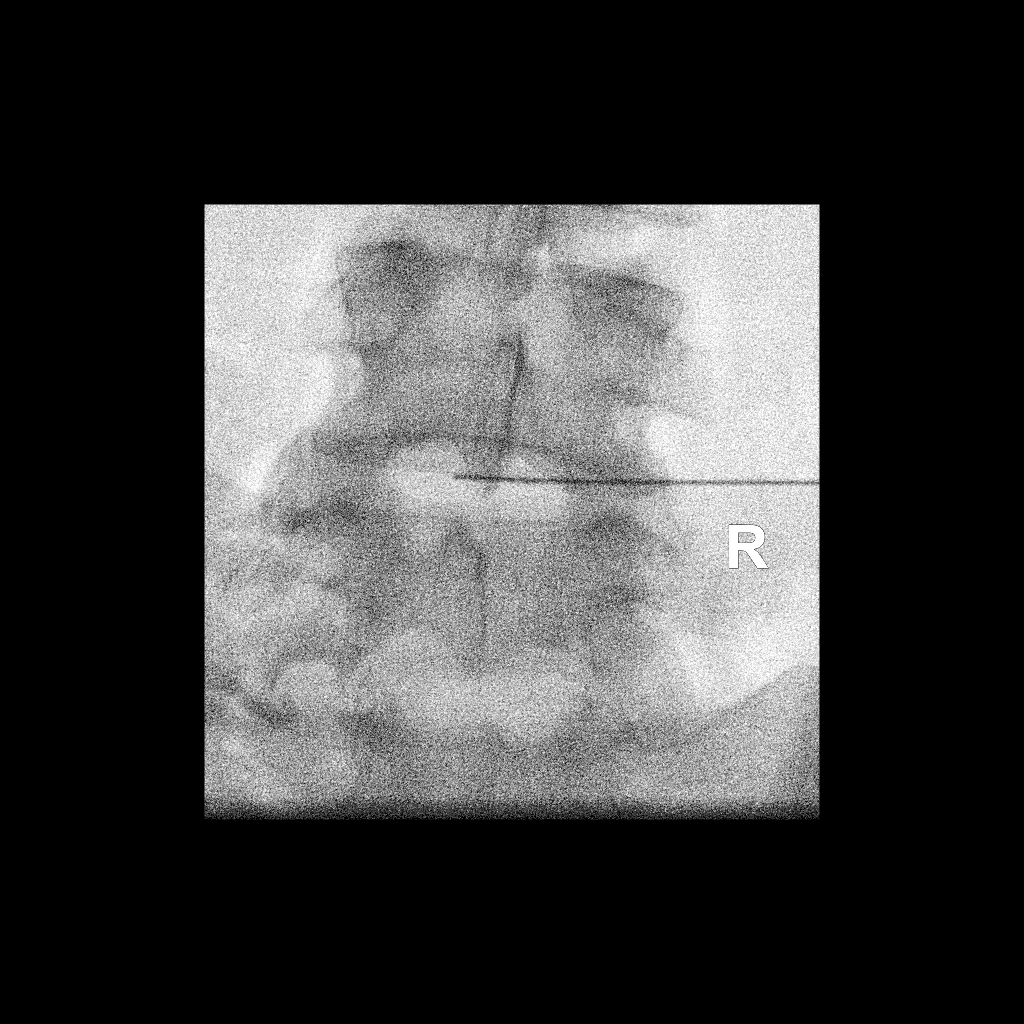

[Series 2: ortho adipose · 1 of 1 slices shown (2 of 2)]
[im 1/1]
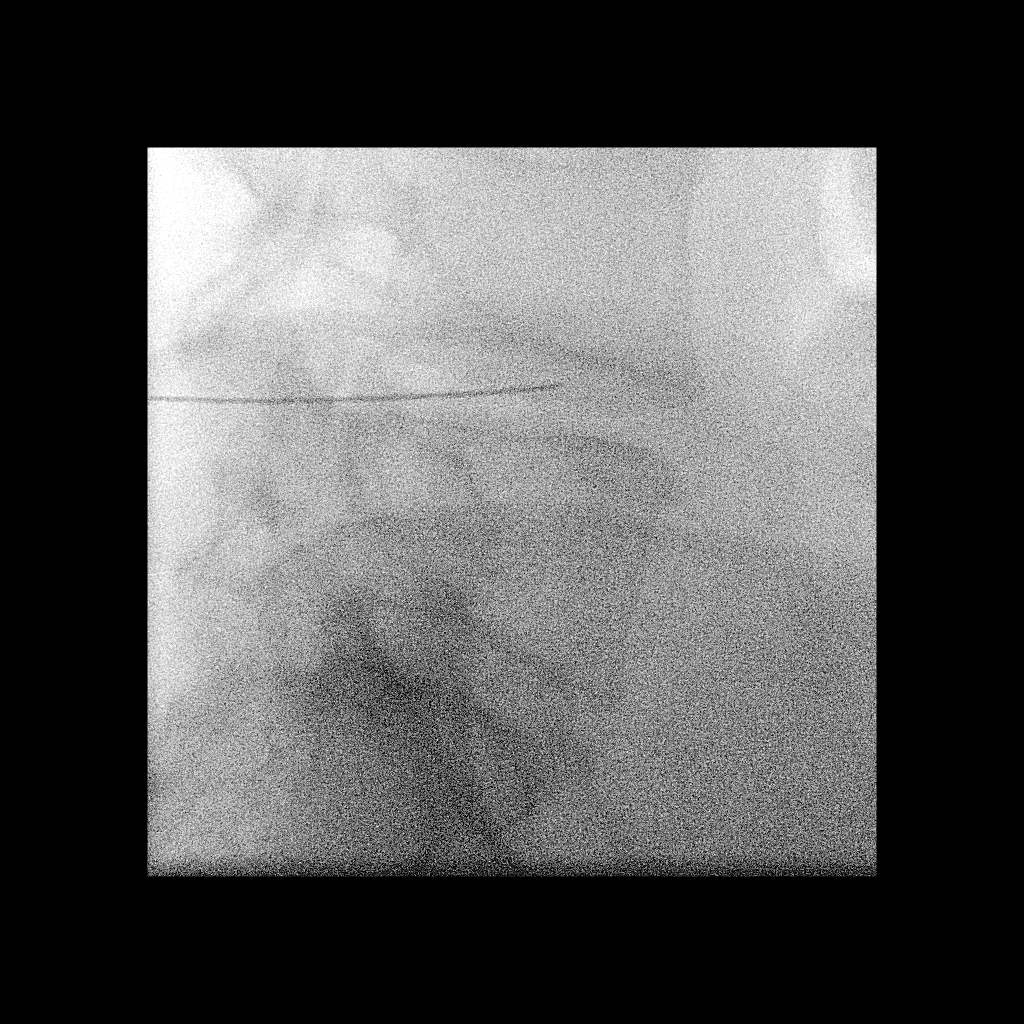

[2 of 2 positions shown; findings below may reference images not displayed]

As antibiotic prophylaxis, cefazolin was ordered pre-procedure and
administered intravenously within one hour of incision.

Lumbar region prepped with Betadine, draped in usual sterile
fashion, and infiltrated locally with 1% lidocaine.

Intravenous Fentanyl and Versed were administered as conscious
sedation during continuous monitoring of the patient's level of
consciousness and physiological / cardiorespiratory status by the
radiology RN.

Curved 22 gauge Kis Wend Loaba advanced into the L4-5 interspace
using a right posterolateral approach. 120 mg Depo-Medrol and 1 mL
bupivacaine 0.5% administered. Needle was removed. The patient
tolerated the procedure well.
FINDINGS: The disc above the transitional lumbosacral segment was localized
corresponding to the intraoperative images from 02/12/2017. Intra
discal injection performed as above.
IMPRESSION: 1. Technically successful lumbar disc injection under fluoroscopy.

## 2022-04-01 DIAGNOSIS — K5900 Constipation, unspecified: Secondary | ICD-10-CM | POA: Diagnosis not present

## 2022-04-01 DIAGNOSIS — A020 Salmonella enteritis: Secondary | ICD-10-CM | POA: Diagnosis not present

## 2022-04-02 DIAGNOSIS — Z4889 Encounter for other specified surgical aftercare: Secondary | ICD-10-CM | POA: Diagnosis not present

## 2022-04-05 DIAGNOSIS — F411 Generalized anxiety disorder: Secondary | ICD-10-CM | POA: Diagnosis not present

## 2022-04-05 DIAGNOSIS — F3111 Bipolar disorder, current episode manic without psychotic features, mild: Secondary | ICD-10-CM | POA: Diagnosis not present

## 2022-04-08 ENCOUNTER — Encounter: Payer: Self-pay | Admitting: Family Medicine

## 2022-04-08 ENCOUNTER — Other Ambulatory Visit: Payer: BC Managed Care – PPO

## 2022-04-08 ENCOUNTER — Other Ambulatory Visit: Payer: Self-pay

## 2022-04-08 ENCOUNTER — Ambulatory Visit (HOSPITAL_BASED_OUTPATIENT_CLINIC_OR_DEPARTMENT_OTHER)
Admission: RE | Admit: 2022-04-08 | Discharge: 2022-04-08 | Disposition: A | Discharge status: Home / Self Care | Payer: BC Managed Care – PPO | Source: Ambulatory Visit | Attending: Family Medicine | Admitting: Family Medicine

## 2022-04-08 ENCOUNTER — Ambulatory Visit (INDEPENDENT_AMBULATORY_CARE_PROVIDER_SITE_OTHER): Payer: BC Managed Care – PPO | Admitting: Family Medicine

## 2022-04-08 ENCOUNTER — Telehealth: Payer: Self-pay

## 2022-04-08 VITALS — BP 118/62 | HR 64 | Temp 98.8°F | Ht 64.0 in | Wt 210.6 lb

## 2022-04-08 DIAGNOSIS — R0789 Other chest pain: Secondary | ICD-10-CM

## 2022-04-08 DIAGNOSIS — R058 Other specified cough: Secondary | ICD-10-CM | POA: Diagnosis not present

## 2022-04-08 DIAGNOSIS — B3731 Acute candidiasis of vulva and vagina: Secondary | ICD-10-CM

## 2022-04-08 DIAGNOSIS — R0602 Shortness of breath: Secondary | ICD-10-CM | POA: Diagnosis not present

## 2022-04-08 LAB — CBC WITH DIFFERENTIAL/PLATELET
Basophils Absolute: 0 10*3/uL (ref 0.0–0.1)
Basophils Relative: 0.2 % (ref 0.0–3.0)
Eosinophils Absolute: 0 10*3/uL (ref 0.0–0.7)
Eosinophils Relative: 0.3 % (ref 0.0–5.0)
HCT: 36.5 % (ref 36.0–46.0)
Hemoglobin: 11.8 g/dL — ABNORMAL LOW (ref 12.0–15.0)
Lymphocytes Relative: 17.3 % (ref 12.0–46.0)
Lymphs Abs: 1.2 10*3/uL (ref 0.7–4.0)
MCHC: 32.3 g/dL (ref 30.0–36.0)
MCV: 85.5 fl (ref 78.0–100.0)
Monocytes Absolute: 0.4 10*3/uL (ref 0.1–1.0)
Monocytes Relative: 5.7 % (ref 3.0–12.0)
Neutro Abs: 5.5 10*3/uL (ref 1.4–7.7)
Neutrophils Relative %: 76.5 % (ref 43.0–77.0)
Platelets: 356 10*3/uL (ref 150.0–400.0)
RBC: 4.28 Mil/uL (ref 3.87–5.11)
RDW: 17.2 % — ABNORMAL HIGH (ref 11.5–15.5)
WBC: 7.2 10*3/uL (ref 4.0–10.5)

## 2022-04-08 LAB — BASIC METABOLIC PANEL
BUN: 9 mg/dL (ref 6–23)
CO2: 25 mEq/L (ref 19–32)
Calcium: 9.5 mg/dL (ref 8.4–10.5)
Chloride: 105 mEq/L (ref 96–112)
Creatinine, Ser: 0.73 mg/dL (ref 0.40–1.20)
GFR: 100.37 mL/min (ref >60.00)
Glucose, Bld: 96 mg/dL (ref 70–99)
Potassium: 4.5 mEq/L (ref 3.5–5.1)
Sodium: 138 mEq/L (ref 135–145)

## 2022-04-08 MED ORDER — IOHEXOL 350 MG/ML SOLN
100.0000 mL | Freq: Once | INTRAVENOUS | Status: AC | PRN
  Administered 2022-04-08: 75 mL via INTRAVENOUS

## 2022-04-08 MED ORDER — FLUCONAZOLE 150 MG PO TABS: 150.0000 mg | ORAL_TABLET | Freq: Every day | ORAL | 0 refills | Status: DC

## 2022-04-08 MED ORDER — ALBUTEROL SULFATE HFA 108 (90 BASE) MCG/ACT IN AERS: 2.0000 | INHALATION_SPRAY | Freq: Four times a day (QID) | RESPIRATORY_TRACT | 0 refills | Status: DC | PRN

## 2022-04-08 MED ORDER — DOXYCYCLINE HYCLATE 100 MG PO TABS: 100.0000 mg | ORAL_TABLET | Freq: Two times a day (BID) | ORAL | 0 refills | Status: AC

## 2022-04-08 NOTE — Progress Notes (Signed)
OFFICE VISIT  04/08/2022  CC:  Chief Complaint  Patient presents with   Chest Pain    Radiating to back for 2 weeks. Unsure if it is anxiety-related. Pt has been experiencing shortness of breath all the time but talking or doing anything makes it worse, slight cough for 1 month and unable to get a deep breath in.    Patient is a 46 y.o. female who presents for chest tightness.  INTERIM HX: About 2 weeks ago she began to note insidious onset of feeling of tightness in her chest, tough to get a deep breath, dry cough.  She does have some chronic rhinitis with postnasal drip and she is not certain this is worse lately or not.  No fever.  She has some intermittent brief/fleeting pain in the left lower chest radiating around to her back.  Her symptoms get worse with talking or with moving around a lot.  No wheezing is heard. No recent trauma or surgical procedures, no prolonged immobility, no history of thrombosis.  She has never had any of these symptoms before.  Past Medical History:  Diagnosis Date   Abnormal cervical Papanicolaou smear 04/20/2020   Allergies    Anxiety    Bipolar disorder ([place])    Chicken pox    Chronic sinusitis 5/32/0233   Complication of anesthesia    pt doesn't like mask she has panic attack; childhood   Depression    Family history of adverse reaction to anesthesia    Mother PONV   GERD (gastroesophageal reflux disease)    History of anemia    during pregnancy   Migraines    Pelvic pain in female 05/13/2014   Pneumonia    PONV (postoperative nausea and vomiting)    no issues with short procdures, had one isntance of vomitus following a longer procedure    Sciatica    Thyroid disease     Past Surgical History:  Procedure Laterality Date   ABDOMINAL EXPOSURE N/A 09/21/2020   Procedure: LATERAL EXPOSURE FOR OBLIQUE LATERAL SPINE SURGERY;  Surgeon: Marty Heck, MD;  Location: [facility];  Service: Vascular;  Laterality: N/A;   ANTERIOR LATERAL LUMBAR  FUSION WITH PERCUTANEOUS SCREW 1 LEVEL N/A 09/21/2020   Procedure: OBLIQUE LUMBAR INTERBODY FUSION LUMBAR FOUR THROUGH FIVE, POSTERIOR SPINAL FUSION INSTRUMENTATION [place];  Surgeon: Melina Schools, MD;  Location: [facility];  Service: Orthopedics;  Laterality: N/A;   CESAREAN SECTION  x2   2000, 2005   LAPAROSCOPY N/A 05/13/2014   Procedure: LAPAROSCOPY DIAGNOSTIC;  Surgeon: Cheri Fowler, MD;  Location: [facility];  Service: Gynecology;  Laterality: N/A;   LUMBAR LAMINECTOMY/DECOMPRESSION MICRODISCECTOMY Left 02/12/2017   Procedure: Microlumbar decompression L5-S1 left ;  Surgeon: Susa Day, MD;  Location: [facility];  Service: Orthopedics;  Laterality: Left;  90 mins   SALIVARY GLAND SURGERY  1986   UPPER GI ENDOSCOPY     WISDOM TOOTH EXTRACTION      Outpatient Medications Prior to Visit  Medication Sig Dispense Refill   clonazePAM (KLONOPIN) 0.5 MG tablet Take 0.5 mg by mouth 3 (three) times daily as needed for anxiety.     fluticasone (FLONASE) 50 MCG/ACT nasal spray Place into both nostrils daily.     ketorolac (ACULAR) 0.5 % ophthalmic solution Place 1 drop into both eyes 4 (four) times daily. 5 mL 3   lamoTRIgine (LAMICTAL) 200 MG tablet Take 200 mg by mouth daily.     levonorgestrel (MIRENA, 52 MG,) 20 MCG/DAY IUD Mirena  21 mcg/24 hours (8 yrs) 52 mg intrauterine device  Take 1 device by intrauterine route.     levothyroxine (SYNTHROID) 25 MCG tablet 1 tab daily p.o. on an empty stomach x6 days a week, 2 tabs p.o. 1 day a week. 102 tablet 3   lithium carbonate (LITHOBID) 300 MG CR tablet Take 900 mg by mouth daily.     Olopatadine HCl 0.2 % SOLN Apply 1 drop to eye daily. 2.5 mL 5   sertraline (ZOLOFT) 100 MG tablet Take 100 mg by mouth daily.     SUMAtriptan (IMITREX) 100 MG tablet Take 1 tablet earliest onset of migraine.  May repeat in 2 hours. Maximum 2 tablets in 24 hours. 10 tablet 5   traZODone (DESYREL) 100 MG tablet Take 100 mg by mouth at bedtime.     Vitamin D,  Ergocalciferol, (DRISDOL) 1.25 MG (50000 UNIT) CAPS capsule Take 1 capsule (50,000 Units total) by mouth every 7 (seven) days. 12 capsule 0   No facility-administered medications prior to visit.    Allergies  Allergen Reactions   Almond (Diagnostic) Anaphylaxis   Codeine Other (See Comments)    Makes patient aggressive     Hydrocodone Other (See Comments)    Makes patient aggressive     ROS As per HPI  PE:    04/08/2022   11:17 AM 03/13/2022    3:25 PM 01/28/2022    6:30 AM  Vitals with BMI  Height '5\' 4"'     Weight 210 lbs 10 oz 212 lbs 3 oz   BMI 21.19    Systolic 417 408 144  Diastolic 62 77 61  Pulse 64 66 64    02 sat 97% RA Physical Exam  Gen: Alert, well appearing.  Patient is oriented to person, place, time, and situation. AFFECT: pleasant, lucid thought and speech. VS: noted--normal. Gen: alert, NAD, NONTOXIC APPEARING. HEENT: eyes without injection, drainage, or swelling.  Ears: EACs clear, TMs with normal light reflex and landmarks.  Nose: Clear rhinorrhea, with some dried, crusty exudate adherent to mildly injected mucosa.  No purulent d/c.  No paranasal sinus TTP.  No facial swelling.  Throat and mouth without focal lesion.  No pharyngial swelling, erythema, or exudate.   Neck: supple, no LAD.   LUNGS: CTA bilat, nonlabored resps.  Expiratory phase is not prolonged.  Good aeration noted. CV: RRR, no m/r/g. EXT: no c/c/e SKIN: no rash   LABS:  Last CBC Lab Results  Component Value Date   WBC 13.0 (H) 01/28/2022   HGB 12.5 01/28/2022   HCT 38.9 01/28/2022   MCV 84.6 01/28/2022   MCH 27.2 01/28/2022   RDW 15.9 (H) 01/28/2022   PLT 418 (H) 81/85/6314   Last metabolic panel Lab Results  Component Value Date   GLUCOSE 108 (H) 01/28/2022   NA 137 01/28/2022   K 3.7 01/28/2022   CL 105 01/28/2022   CO2 22 01/28/2022   BUN 10 01/28/2022   CREATININE 0.79 01/28/2022   GFRNONAA >60 01/28/2022   CALCIUM 9.5 01/28/2022   PROT 7.6 01/28/2022    ALBUMIN 4.8 01/28/2022   BILITOT 0.8 01/28/2022   ALKPHOS 67 01/28/2022   AST 13 (L) 01/28/2022   ALT 11 01/28/2022   ANIONGAP 10 01/28/2022    IMPRESSION AND PLAN:  #1 chest tightness, shortness of breath, brief periods of atypical chest pain. Need to rule out PE. CT angio chest ordered stat today. Check CBC with differential, c-Met, D-dimer. If this work-up is negative then  will do a trial of treatment for asthmatic bronchitis.  Of note, she had some nodular densities at the left lung base that were detected incidentally on a CT abdomen pelvis with contrast done on 01/28/2022.  Today's imaging will follow this up.  2. yeast vaginitis. At the end of the visit today she said she had vaginal itching and discharge and says she feels like she has a yeast infection. I sent in Diflucan 150 mg, 1 tab x1 dose.  An After Visit Summary was printed and given to the patient.  FOLLOW UP: Return for 3-4 d f/u sob.  Signed:  Crissie Sickles, MD           04/08/2022

## 2022-04-08 NOTE — Telephone Encounter (Signed)
[facility] - Client TELEPHONE ADVICE RECORD [facility] Patient Name: Sheila Mathews Gender: Female DOB: [birth year] Age: 46 Y 11 M 3 D Return Phone Number: [phone] (Primary) Address: City/ State/ Zip: [place]  [address] Client [facility] - Client Client Site [place] - Day Provider Raoul Pitch, [place] Type Call Who Is Calling Patient / Member / Family / Caregiver Call Type Triage / Clinical Caller Name Jazmarie Biever Relationship To Patient Self Return Phone Number [phone] (Primary) Chief Complaint CHEST PAIN - pain, pressure, heaviness or tightness Reason for Call Symptomatic / Request for [place] states she has chest tightness that is radiating to the back Translation No Nurse Assessment Nurse: Ronnald Ramp, RN, Miranda Date/Time (Eastern Time): 04/08/2022 8:40:24 AM Confirm and document reason for call. If symptomatic, describe symptoms. ---Caller states she is having tightness in her chest that is radiating into her back. She has had symptoms for 2 weeks. She states it started after taping off Zoloft but it did not get better after going back up on the dose. She also has a slight cough for 1 month. She feels like she is not able to get a deep breath. She feels it could be related to anxiety. Does the patient have any new or worsening symptoms? ---Yes Will a triage be completed? ---Yes Related visit to physician within the last 2 weeks? ---No Does the PT have any chronic conditions? (i.e. diabetes, asthma, this includes High risk factors for pregnancy, etc.) ---Yes List chronic conditions. ---Anxiety, Bi-polar, Is the patient pregnant or possibly pregnant? (Ask all females between the ages of 70-55) ---No Is this a behavioral health or substance abuse call? ---No Guidelines Guideline Title Affirmed Question Affirmed Notes Nurse Date/Time  (Eastern Time) Cough - Chronic [1] MILD difficulty breathing (e.g., minimal/no SOB Jones, RN, Miranda 04/08/2022 8:43:31 AM PLEASE NOTE: All timestamps contained within this report are represented as [place] Standard Time. CONFIDENTIALTY NOTICE: This fax transmission is intended only for the addressee. It contains information that is legally privileged, confidential or otherwise protected from use or disclosure. If you are not the intended recipient, you are strictly prohibited from reviewing, disclosing, copying using or disseminating any of this information or taking any action in reliance on or regarding this information. If you have received this fax in error, please notify us immediately by telephone so that we can arrange for its return to [place]. Phone: [phone], Toll-Free: [phone], Fax: [phone] Page: 2 of 2 Call Id: [id number] Guidelines Guideline Title Affirmed Question Affirmed Notes Nurse Date/Time Eilene Ghazi Time) at rest, SOB with walking, pulse <100) AND [2] still present when not coughing (Exception: No change from usual, chronic shortness of breath.) Disp. Time Eilene Ghazi Time) Disposition Final User 04/08/2022 8:38:17 AM Send to Urgent Queue [place] Level 04/08/2022 8:47:20 AM See HCP within 4 Hours (or PCP triage) Yes Ronnald Ramp, RN, Miranda Final Disposition 04/08/2022 8:47:20 AM See HCP within 4 Hours (or PCP triage) Yes Ronnald Ramp, RN, Miranda Caller Disagree/Comply Comply Caller Understands Yes PreDisposition Call Doctor Care Advice Given Per Guideline SEE HCP (OR PCP TRIAGE) WITHIN 4 HOURS: CALL BACK IF: * You become worse CARE ADVICE given per Cough - Chronic (Adult) guideline. * IF OFFICE WILL BE OPEN: You need to be seen within the next 3 or 4 hours. Call your doctor (or NP/ PA) now or as soon as the office opens. Comments User: Leverne Humbles, RN Date/Time Eilene Ghazi Time): 04/08/2022 8:49:31 AM Appt  scheduled for today at 11:20am Referrals REFERRED TO  PCP OFFICE

## 2022-04-09 LAB — D-DIMER, QUANTITATIVE: D-Dimer, Quant: 0.37 mcg/mL FEU (ref <0.50)

## 2022-04-11 ENCOUNTER — Encounter: Payer: Self-pay | Admitting: Family Medicine

## 2022-04-11 ENCOUNTER — Ambulatory Visit (INDEPENDENT_AMBULATORY_CARE_PROVIDER_SITE_OTHER): Payer: BC Managed Care – PPO | Admitting: Family Medicine

## 2022-04-11 VITALS — BP 120/70 | HR 71 | Ht 64.0 in | Wt 209.4 lb

## 2022-04-11 DIAGNOSIS — J45901 Unspecified asthma with (acute) exacerbation: Secondary | ICD-10-CM

## 2022-04-11 DIAGNOSIS — J189 Pneumonia, unspecified organism: Secondary | ICD-10-CM

## 2022-04-11 MED ORDER — ALBUTEROL SULFATE HFA 108 (90 BASE) MCG/ACT IN AERS
2.0000 | INHALATION_SPRAY | Freq: Four times a day (QID) | RESPIRATORY_TRACT | 0 refills | Status: DC | PRN
Start: 2022-04-11 — End: 2022-11-05

## 2022-04-11 MED ORDER — PREDNISONE 20 MG PO TABS: ORAL_TABLET | ORAL | 0 refills | Status: DC

## 2022-04-11 NOTE — Progress Notes (Signed)
OFFICE VISIT  04/11/2022  CC:  Chief Complaint  Patient presents with   Follow-up    Shortness of breath     Patient is a 46 y.o. female who presents for 3-day follow-up shortness of breath. A/P as of last visit: "#1 chest tightness, shortness of breath, brief periods of atypical chest pain. Need to rule out PE. CT angio chest ordered stat today. Check CBC with differential, c-Met, D-dimer. If this work-up is negative then will do a trial of treatment for asthmatic bronchitis.   Of note, she had some nodular densities at the left lung base that were detected incidentally on a CT abdomen pelvis with contrast done on 01/28/2022.  Today's imaging will follow this up.   2. yeast vaginitis. At the end of the visit today she said she had vaginal itching and discharge and says she feels like she has a yeast infection. I sent in Diflucan 150 mg, 1 tab x1 dose."  INTERIM HX: All labs were normal last visit. CT angio chest was negative for PE. There was a little bit of improvement in the nodular area in the left lung that was previously noted on her CAT scan of the abdomen back in July. Question atypical infection. I recommend doxycycline x10 days. Additionally, I prescribed albuterol inhaler.   She will need a repeat of her CT scan of her chest in 3 months to recheck the area for change.  UPDATE:  She feels significantly improved  If she takes her albuterol 2 puffs every 6 hours this helps a lot to alleviate her chest tightness and dry cough. No wheezing, no mucus production, no fever, no chest pain. She is taking doxycycline without problem.  Past Medical History:  Diagnosis Date   Abnormal cervical Papanicolaou smear 04/20/2020   Allergies    Anxiety    Bipolar disorder ([place])    Chicken pox    Chronic sinusitis 0/32/1224   Complication of anesthesia    pt doesn't like mask she has panic attack; childhood   Depression    Family history of adverse reaction to anesthesia     Mother PONV   GERD (gastroesophageal reflux disease)    History of anemia    during pregnancy   Migraines    Pelvic pain in female 05/13/2014   Pneumonia    PONV (postoperative nausea and vomiting)    no issues with short procdures, had one isntance of vomitus following a longer procedure    Sciatica    Thyroid disease     Past Surgical History:  Procedure Laterality Date   ABDOMINAL EXPOSURE N/A 09/21/2020   Procedure: LATERAL EXPOSURE FOR OBLIQUE LATERAL SPINE SURGERY;  Surgeon: Marty Heck, MD;  Location: [facility];  Service: Vascular;  Laterality: N/A;   ANTERIOR LATERAL LUMBAR FUSION WITH PERCUTANEOUS SCREW 1 LEVEL N/A 09/21/2020   Procedure: OBLIQUE LUMBAR INTERBODY FUSION LUMBAR FOUR THROUGH FIVE, POSTERIOR SPINAL FUSION INSTRUMENTATION [place];  Surgeon: Melina Schools, MD;  Location: [facility];  Service: Orthopedics;  Laterality: N/A;   CESAREAN SECTION  x2   2000, 2005   LAPAROSCOPY N/A 05/13/2014   Procedure: LAPAROSCOPY DIAGNOSTIC;  Surgeon: Cheri Fowler, MD;  Location: [facility];  Service: Gynecology;  Laterality: N/A;   LUMBAR LAMINECTOMY/DECOMPRESSION MICRODISCECTOMY Left 02/12/2017   Procedure: Microlumbar decompression L5-S1 left ;  Surgeon: Susa Day, MD;  Location: [facility];  Service: Orthopedics;  Laterality: Left;  90 mins   SALIVARY GLAND SURGERY  1986   UPPER GI ENDOSCOPY  WISDOM TOOTH EXTRACTION      Outpatient Medications Prior to Visit  Medication Sig Dispense Refill   clonazePAM (KLONOPIN) 0.5 MG tablet Take 0.5 mg by mouth 3 (three) times daily as needed for anxiety.     doxycycline (VIBRA-TABS) 100 MG tablet Take 1 tablet (100 mg total) by mouth 2 (two) times daily for 10 days. 20 tablet 0   fluticasone (FLONASE) 50 MCG/ACT nasal spray Place into both nostrils daily.     ketorolac (ACULAR) 0.5 % ophthalmic solution Place 1 drop into both eyes 4 (four) times daily. 5 mL 3   lamoTRIgine (LAMICTAL) 200 MG tablet Take 200 mg by mouth  daily.     levonorgestrel (MIRENA, 52 MG,) 20 MCG/DAY IUD Mirena 21 mcg/24 hours (8 yrs) 52 mg intrauterine device  Take 1 device by intrauterine route.     levothyroxine (SYNTHROID) 25 MCG tablet 1 tab daily p.o. on an empty stomach x6 days a week, 2 tabs p.o. 1 day a week. 102 tablet 3   lithium carbonate (LITHOBID) 300 MG CR tablet Take 600 mg by mouth daily.     sertraline (ZOLOFT) 100 MG tablet Take 100 mg by mouth daily.     SUMAtriptan (IMITREX) 100 MG tablet Take 1 tablet earliest onset of migraine.  May repeat in 2 hours. Maximum 2 tablets in 24 hours. 10 tablet 5   traZODone (DESYREL) 100 MG tablet Take 100 mg by mouth at bedtime.     Vitamin D, Ergocalciferol, (DRISDOL) 1.25 MG (50000 UNIT) CAPS capsule Take 1 capsule (50,000 Units total) by mouth every 7 (seven) days. 12 capsule 0   albuterol (VENTOLIN HFA) 108 (90 Base) MCG/ACT inhaler Inhale 2 puffs into the lungs every 6 (six) hours as needed for wheezing or shortness of breath. 8 g 0   fluconazole (DIFLUCAN) 150 MG tablet Take 1 tablet (150 mg total) by mouth daily. 1 tablet 0   Olopatadine HCl 0.2 % SOLN Apply 1 drop to eye daily. (Patient not taking: Reported on 04/11/2022) 2.5 mL 5   No facility-administered medications prior to visit.    Allergies  Allergen Reactions   Almond (Diagnostic) Anaphylaxis   Codeine Other (See Comments)    Makes patient aggressive     Hydrocodone Other (See Comments)    Makes patient aggressive     ROS As per HPI  PE:    04/11/2022    1:54 PM 04/08/2022   11:17 AM 03/13/2022    3:25 PM  Vitals with BMI  Height '5\' 4"'  '5\' 4"'    Weight 209 lbs 6 oz 210 lbs 10 oz 212 lbs 3 oz  BMI 16.38 45.36   Systolic 468 032 122  Diastolic 70 62 77  Pulse 71 64 66  96% on RA today  Physical Exam  Gen: Alert, well appearing.  Patient is oriented to person, place, time, and situation. AFFECT: pleasant, lucid thought and speech. CV: RRR, no m/r/g.   LUNGS: CTA bilat, nonlabored resps, good  aeration in all lung fields.   LABS:  Last CBC Lab Results  Component Value Date   WBC 7.2 04/08/2022   HGB 11.8 (L) 04/08/2022   HCT 36.5 04/08/2022   MCV 85.5 04/08/2022   MCH 27.2 01/28/2022   RDW 17.2 (H) 04/08/2022   PLT 356.0 48/25/0037   Last metabolic panel Lab Results  Component Value Date   GLUCOSE 96 04/08/2022   NA 138 04/08/2022   K 4.5 04/08/2022   CL 105 04/08/2022  CO2 25 04/08/2022   BUN 9 04/08/2022   CREATININE 0.73 04/08/2022   GFRNONAA >60 01/28/2022   CALCIUM 9.5 04/08/2022   PROT 7.6 01/28/2022   ALBUMIN 4.8 01/28/2022   BILITOT 0.8 01/28/2022   ALKPHOS 67 01/28/2022   AST 13 (L) 01/28/2022   ALT 11 01/28/2022   ANIONGAP 10 01/28/2022     Lab Results  Component Value Date   DDIMER 0.37 04/08/2022   IMPRESSION AND PLAN:  Acute respiratory infection, some reactive airway disease is her main manifestation. Improving with albuterol and doxycycline. I will add prednisone 40 mg a day x5 days then 20 mg a day x5 days. Refilled albuterol. Finish doxycycline.  As noted in HPI, she will need follow-up CT in 3 months.  An After Visit Summary was printed and given to the patient.  FOLLOW UP: Return in about 2 weeks (around 04/25/2022) for f/u respiratory.  Signed:  Crissie Sickles, MD           04/11/2022

## 2022-04-18 DIAGNOSIS — F3132 Bipolar disorder, current episode depressed, moderate: Secondary | ICD-10-CM | POA: Diagnosis not present

## 2022-04-19 DIAGNOSIS — F3111 Bipolar disorder, current episode manic without psychotic features, mild: Secondary | ICD-10-CM | POA: Diagnosis not present

## 2022-04-19 DIAGNOSIS — F411 Generalized anxiety disorder: Secondary | ICD-10-CM | POA: Diagnosis not present

## 2022-04-26 ENCOUNTER — Encounter: Payer: Self-pay | Admitting: Family Medicine

## 2022-04-26 ENCOUNTER — Ambulatory Visit (INDEPENDENT_AMBULATORY_CARE_PROVIDER_SITE_OTHER): Payer: BC Managed Care – PPO | Admitting: Family Medicine

## 2022-04-26 VITALS — BP 120/77 | HR 78 | Temp 98.0°F | Ht 64.0 in | Wt 209.6 lb

## 2022-04-26 DIAGNOSIS — J189 Pneumonia, unspecified organism: Secondary | ICD-10-CM | POA: Diagnosis not present

## 2022-04-26 DIAGNOSIS — R918 Other nonspecific abnormal finding of lung field: Secondary | ICD-10-CM

## 2022-04-26 MED ORDER — AZITHROMYCIN 250 MG PO TABS: ORAL_TABLET | ORAL | 0 refills | Status: AC

## 2022-04-26 MED ORDER — BECLOMETHASONE DIPROP HFA 40 MCG/ACT IN AERB: 1.0000 | INHALATION_SPRAY | Freq: Two times a day (BID) | RESPIRATORY_TRACT | 0 refills | Status: DC

## 2022-04-26 NOTE — Progress Notes (Unsigned)
Sheila Mathews , 11/07/1975, 46 y.o., female MRN: [id number] Patient Care Team    Relationship Specialty Notifications Start End  Natalia Leatherwood, DO PCP - General Family Medicine  05/05/20   Harold Hedge, MD Consulting Physician Obstetrics and Gynecology  02/05/21   Drema Dallas, DO Consulting Physician Neurology  02/05/21   Jeani Hawking, MD Consulting Physician Gastroenterology  02/05/21   Venita Lick, MD Consulting Physician Orthopedic Surgery  02/05/21     Chief Complaint  Patient presents with   Follow-up    2 week follow up for respiratory issues.      Subjective: Pt presents for an OV with complaints of *** of *** duration.  Associated symptoms include ***.  Pt has tried *** to ease their symptoms.      04/26/2022    2:55 PM 05/05/2020    9:26 AM  Depression screen PHQ 2/9  Decreased Interest 3 1  Down, Depressed, Hopeless 3 1  PHQ - 2 Score 6 2  Altered sleeping  3  Tired, decreased energy  1  Change in appetite  3  Feeling bad or failure about yourself   1  Trouble concentrating  0  Moving slowly or fidgety/restless  0  Suicidal thoughts  0  PHQ-9 Score  10    Allergies  Allergen Reactions   Almond (Diagnostic) Anaphylaxis   Codeine Other (See Comments)    Makes patient aggressive     Hydrocodone Other (See Comments)    Makes patient aggressive    Social History   Social History Narrative   Marital status/children/pets: Married, 3 children.    Education/employment: Oncologist, healthcare advocate   Safety:      -smoke alarm in the home:Yes     - wears seatbelt: Yes     - Feels safe in their relationships: Yes   Right handed   No caffeine   One story home   Past Medical History:  Diagnosis Date   Abnormal cervical Papanicolaou smear 04/20/2020   Allergies    Anxiety    Bipolar disorder ([facility])    Chicken pox    Chronic sinusitis 07/26/2016   Complication of anesthesia    pt doesn't like mask she has panic attack; childhood    Depression    Family history of adverse reaction to anesthesia    Mother PONV   GERD (gastroesophageal reflux disease)    History of anemia    during pregnancy   Migraines    Pelvic pain in female 05/13/2014   Pneumonia    PONV (postoperative nausea and vomiting)    no issues with short procdures, had one isntance of vomitus following a longer procedure    Sciatica    Thyroid disease    Past Surgical History:  Procedure Laterality Date   ABDOMINAL EXPOSURE N/A 09/21/2020   Procedure: LATERAL EXPOSURE FOR OBLIQUE LATERAL SPINE SURGERY;  Surgeon: Cephus Shelling, MD;  Location: [facility];  Service: Vascular;  Laterality: N/A;   ANTERIOR LATERAL LUMBAR FUSION WITH PERCUTANEOUS SCREW 1 LEVEL N/A 09/21/2020   Procedure: OBLIQUE LUMBAR INTERBODY FUSION LUMBAR FOUR THROUGH FIVE, POSTERIOR SPINAL FUSION INSTRUMENTATION LUMAR FOUR THROUGH FIVE;  Surgeon: Venita Lick, MD;  Location: [facility];  Service: Orthopedics;  Laterality: N/A;   CESAREAN SECTION  x2   2000, 2005   LAPAROSCOPY N/A 05/13/2014   Procedure: LAPAROSCOPY DIAGNOSTIC;  Surgeon: Lavina Hamman, MD;  Location: [facility];  Service: Gynecology;  Laterality: N/A;  LUMBAR LAMINECTOMY/DECOMPRESSION MICRODISCECTOMY Left 02/12/2017   Procedure: Microlumbar decompression L5-S1 left ;  Surgeon: Susa Day, MD;  Location: [facility];  Service: Orthopedics;  Laterality: Left;  90 mins   SALIVARY GLAND SURGERY  1986   UPPER GI ENDOSCOPY     WISDOM TOOTH EXTRACTION     Family History  Problem Relation Age of Onset   Depression Mother    Mental illness Mother    Diabetes Brother    Drug abuse Brother    Early death Brother    Mental illness Brother    Breast cancer Maternal Grandmother 23   Cancer Maternal Grandfather        head neck    Diabetes Maternal Grandfather    Cancer Paternal Grandmother        gallbladder   Colon cancer Paternal Grandfather 49   Diabetes Paternal Grandfather    Allergies as of 04/26/2022       Reactions    Almond (diagnostic) Anaphylaxis   Codeine Other (See Comments)   Makes patient aggressive    Hydrocodone Other (See Comments)   Makes patient aggressive         Medication List        Accurate as of April 26, 2022  2:57 PM. If you have any questions, ask your nurse or doctor.          albuterol 108 (90 Base) MCG/ACT inhaler Commonly known as: VENTOLIN HFA Inhale 2 puffs into the lungs every 6 (six) hours as needed for wheezing or shortness of breath.   clonazePAM 0.5 MG tablet Commonly known as: KLONOPIN Take 0.5 mg by mouth 3 (three) times daily as needed for anxiety.   fluticasone 50 MCG/ACT nasal spray Commonly known as: FLONASE Place into both nostrils daily.   ketorolac 0.5 % ophthalmic solution Commonly known as: ACULAR Place 1 drop into both eyes 4 (four) times daily.   lamoTRIgine 200 MG tablet Commonly known as: LAMICTAL Take 200 mg by mouth daily.   levothyroxine 25 MCG tablet Commonly known as: SYNTHROID 1 tab daily p.o. on an empty stomach x6 days a week, 2 tabs p.o. 1 day a week.   lithium carbonate 300 MG CR tablet Commonly known as: LITHOBID Take 600 mg by mouth daily.   loratadine 10 MG tablet Commonly known as: CLARITIN Take 10 mg by mouth daily.   Mirena (52 MG) 20 MCG/DAY Iud Generic drug: levonorgestrel Mirena 21 mcg/24 hours (8 yrs) 52 mg intrauterine device  Take 1 device by intrauterine route.   Olopatadine HCl 0.2 % Soln Apply 1 drop to eye daily.   predniSONE 20 MG tablet Commonly known as: DELTASONE 2 tabs po qd x 5d then 1 tab po qd x 5d   sertraline 100 MG tablet Commonly known as: ZOLOFT Take 100 mg by mouth daily.   SUMAtriptan 100 MG tablet Commonly known as: IMITREX Take 1 tablet earliest onset of migraine.  May repeat in 2 hours. Maximum 2 tablets in 24 hours.   traZODone 100 MG tablet Commonly known as: DESYREL Take 100 mg by mouth at bedtime.   Vitamin D (Ergocalciferol) 1.25 MG (50000 UNIT) Caps  capsule Commonly known as: DRISDOL Take 1 capsule (50,000 Units total) by mouth every 7 (seven) days.        All past medical history, surgical history, allergies, family history, immunizations andmedications were updated in the EMR today and reviewed under the history and medication portions of their EMR.     ROS Negative,  with the exception of above mentioned in HPI   Objective:  There were no vitals taken for this visit. There is no height or weight on file to calculate BMI.  Physical Exam   No results found. No results found. No results found for this or any previous visit (from the past 24 hour(s)).  Assessment/Plan: LODA BIALAS is a 46 y.o. female present for OV for  *** Reviewed expectations re: course of current medical issues. Discussed self-management of symptoms. Outlined signs and symptoms indicating need for more acute intervention. Patient verbalized understanding and all questions were answered. Patient received an After-Visit Summary.    No orders of the defined types were placed in this encounter.  No orders of the defined types were placed in this encounter.  Referral Orders  No referral(s) requested today     Note is dictated utilizing voice recognition software. Although note has been proof read prior to signing, occasional typographical errors still can be missed. If any questions arise, please do not hesitate to call for verification.   electronically signed by:  Felix Pacini, DO  [facility] - OR

## 2022-04-26 NOTE — Patient Instructions (Addendum)
Return if symptoms worsen or fail to improve.        Great to see you today.  I have refilled the medication(s) we provide.   If labs were collected, we will inform you of lab results once received either by echart message or telephone call.   - echart message- for normal results that have been seen by the patient already.   - telephone call: abnormal results or if patient has not viewed results in their echart.   __________________________________________________ Good plant sources of iron are lentils, chickpeas, beans, tofu, cashew nuts, pumpkin seeds, kale, dried apricots and figs, raisins, quinoa and fortified breakfast cereal.

## 2022-05-09 ENCOUNTER — Institutional Professional Consult (permissible substitution): Payer: BC Managed Care – PPO | Admitting: Pulmonary Disease

## 2022-05-09 DIAGNOSIS — R5383 Other fatigue: Secondary | ICD-10-CM | POA: Diagnosis not present

## 2022-05-09 DIAGNOSIS — Z79899 Other long term (current) drug therapy: Secondary | ICD-10-CM | POA: Diagnosis not present

## 2022-05-09 DIAGNOSIS — F3111 Bipolar disorder, current episode manic without psychotic features, mild: Secondary | ICD-10-CM | POA: Diagnosis not present

## 2022-05-13 ENCOUNTER — Other Ambulatory Visit: Payer: Self-pay

## 2022-05-13 ENCOUNTER — Emergency Department (HOSPITAL_BASED_OUTPATIENT_CLINIC_OR_DEPARTMENT_OTHER)
Admission: EM | Admit: 2022-05-13 | Discharge: 2022-05-13 | Disposition: A | Discharge status: Home / Self Care | Payer: BC Managed Care – PPO | Attending: Emergency Medicine | Admitting: Emergency Medicine

## 2022-05-13 ENCOUNTER — Encounter (HOSPITAL_BASED_OUTPATIENT_CLINIC_OR_DEPARTMENT_OTHER): Payer: Self-pay

## 2022-05-13 ENCOUNTER — Telehealth: Payer: Self-pay

## 2022-05-13 ENCOUNTER — Emergency Department (HOSPITAL_BASED_OUTPATIENT_CLINIC_OR_DEPARTMENT_OTHER): Payer: BC Managed Care – PPO | Admitting: Radiology

## 2022-05-13 DIAGNOSIS — R0602 Shortness of breath: Secondary | ICD-10-CM | POA: Diagnosis not present

## 2022-05-13 DIAGNOSIS — R0789 Other chest pain: Secondary | ICD-10-CM | POA: Insufficient documentation

## 2022-05-13 DIAGNOSIS — R079 Chest pain, unspecified: Secondary | ICD-10-CM | POA: Diagnosis not present

## 2022-05-13 LAB — CBC
HCT: 40.2 % (ref 36.0–46.0)
Hemoglobin: 12.6 g/dL (ref 12.0–15.0)
MCH: 26.8 pg (ref 26.0–34.0)
MCHC: 31.3 g/dL (ref 30.0–36.0)
MCV: 85.4 fL (ref 80.0–100.0)
Platelets: 407 10*3/uL — ABNORMAL HIGH (ref 150–400)
RBC: 4.71 MIL/uL (ref 3.87–5.11)
RDW: 15.2 % (ref 11.5–15.5)
WBC: 9.7 10*3/uL (ref 4.0–10.5)
nRBC: 0 % (ref 0.0–0.2)

## 2022-05-13 LAB — BASIC METABOLIC PANEL
Anion gap: 12 (ref 5–15)
BUN: 12 mg/dL (ref 6–20)
CO2: 21 mmol/L — ABNORMAL LOW (ref 22–32)
Calcium: 10.2 mg/dL (ref 8.9–10.3)
Chloride: 104 mmol/L (ref 98–111)
Creatinine, Ser: 0.82 mg/dL (ref 0.44–1.00)
GFR, Estimated: >60 mL/min (ref >60)
Glucose, Bld: 93 mg/dL (ref 70–99)
Potassium: 4.1 mmol/L (ref 3.5–5.1)
Sodium: 137 mmol/L (ref 135–145)

## 2022-05-13 LAB — TROPONIN I (HIGH SENSITIVITY)
Troponin I (High Sensitivity): 2 ng/L (ref <18)
Troponin I (High Sensitivity): <2 ng/L (ref <18)

## 2022-05-13 MED ORDER — IPRATROPIUM-ALBUTEROL 0.5-2.5 (3) MG/3ML IN SOLN
3.0000 mL | Freq: Once | RESPIRATORY_TRACT | Status: AC
  Administered 2022-05-13: 3 mL via RESPIRATORY_TRACT
  Filled 2022-05-13: qty 3

## 2022-05-13 MED ORDER — DEXAMETHASONE SODIUM PHOSPHATE 10 MG/ML IJ SOLN
10.0000 mg | Freq: Once | INTRAMUSCULAR | Status: AC
  Administered 2022-05-13: 10 mg via INTRAVENOUS
  Filled 2022-05-13: qty 1

## 2022-05-13 NOTE — Telephone Encounter (Signed)
Please encourage patient to keep the 12/11 appointment.  She can also asked them to be put on a wait list. A new referral would take longer to be processed and to work her in.

## 2022-05-13 NOTE — Telephone Encounter (Signed)
Pt states she is not getting any relief with the inhalers

## 2022-05-13 NOTE — Discharge Instructions (Addendum)
Please return to the ED with any new or worsening signs or symptoms Please read attached guides concerning shortness of breath and nonspecific chest pain in adults Please follow-up with pulmonology as previously scheduled Please remain hydrated and push fluids

## 2022-05-13 NOTE — Telephone Encounter (Signed)
Please advise 

## 2022-05-13 NOTE — Telephone Encounter (Signed)
Advised pt to call and get on waitlist.

## 2022-05-13 NOTE — Telephone Encounter (Signed)
Patient was scheduled for [facility] on 11/9.  They called patient to reschedule appt - provider out of office on 11/9.  First available appt on 12/11.  Patient is requesting to change referral to a different doctor with a sooner appt than 12/11.  Please advise [phone]

## 2022-05-13 NOTE — ED Triage Notes (Signed)
Pt c/o chest tightness and SOB intermittently for one month, exacerbated today while at work. Pt also c/o dizziness.

## 2022-05-13 NOTE — ED Notes (Signed)
Patient transported to X-ray 

## 2022-05-13 NOTE — ED Provider Notes (Signed)
[facility] EMERGENCY DEPT Provider Note   CSN: [id number] Arrival date & time: 05/13/22  1237     History  Chief Complaint  Patient presents with   Chest Pain   Shortness of Breath    Sheila Mathews is a 46 y.o. female with medical history to include chronic sinusitis, allergies, bipolar disorder, depression, GERD, anemia, pneumonia, sciatica.  Patient presents to ED for evaluation of chest pain and shortness of breath.  Patient reports that over the last 6 weeks she has had chest tightness, shortness of breath every day all day patient states is hard to get a deep breath in and she is having a dry cough that is nonproductive.  Patient has been seen by her PCP multiple times for this issue.  The patient was initially started on antibiotics to include doxycycline and this was initially thought to be an acute respiratory infection.  Patient reportedly was improving with albuterol and doxycycline and her PCP added prednisone 40 mg a day x5 days and then 20 mg a day x5 days.  Patient CT angiogram ordered at this time which did not show any evidence of PE.  Patient returned by her PCP stating that her symptoms were persisting and she was placed on azithromycin at this time as well as doxycycline.  Patient was also prescribed Qvar and referred to pulmonology.  Patient presents today complaining of chest tightness and shortness of breath.  Patient reports that today the shortness of breath became so severe that she was having take deep breaths and do a "double gulp to get a good breath of air in".  Patient denies any fevers, nausea, vomiting, abdominal pain or back pain.   Chest Pain Associated symptoms: shortness of breath   Associated symptoms: no abdominal pain, no back pain, no dizziness, no nausea and no vomiting   Shortness of Breath Associated symptoms: chest pain   Associated symptoms: no abdominal pain and no vomiting        Home Medications Prior to Admission  medications   Medication Sig Start Date End Date Taking? Authorizing Provider  albuterol (VENTOLIN HFA) 108 (90 Base) MCG/ACT inhaler Inhale 2 puffs into the lungs every 6 (six) hours as needed for wheezing or shortness of breath. 04/11/22   McGowen, Maryjean Morn, MD  beclomethasone (QVAR) 40 MCG/ACT inhaler Inhale 1 puff into the lungs 2 (two) times daily. 04/26/22   Kuneff, Renee A, DO  clonazePAM (KLONOPIN) 0.5 MG tablet Take 0.5 mg by mouth 3 (three) times daily as needed for anxiety.    [provider]  fluticasone (FLONASE) 50 MCG/ACT nasal spray Place into both nostrils daily.    [provider]  ketorolac (ACULAR) 0.5 % ophthalmic solution Place 1 drop into both eyes 4 (four) times daily. 03/25/22   Kuneff, Renee A, DO  lamoTRIgine (LAMICTAL) 200 MG tablet Take 200 mg by mouth daily.    [provider]  levonorgestrel (MIRENA, 52 MG,) 20 MCG/DAY IUD Mirena 21 mcg/24 hours (8 yrs) 52 mg intrauterine device  Take 1 device by intrauterine route.    [provider]  levothyroxine (SYNTHROID) 25 MCG tablet 1 tab daily p.o. on an empty stomach x6 days a week, 2 tabs p.o. 1 day a week. 03/14/22   Kuneff, Renee A, DO  lithium carbonate (LITHOBID) 300 MG CR tablet Take 600 mg by mouth daily. 04/07/20   [provider]  loratadine (CLARITIN) 10 MG tablet Take 10 mg by mouth daily.    [provider]  Olopatadine HCl 0.2 % SOLN Apply 1 drop to eye daily. Patient not taking: Reported on 04/11/2022 03/15/22   Howard Pouch A, DO  sertraline (ZOLOFT) 100 MG tablet Take 100 mg by mouth daily.    [provider]  SUMAtriptan (IMITREX) 100 MG tablet Take 1 tablet earliest onset of migraine.  May repeat in 2 hours. Maximum 2 tablets in 24 hours. 01/10/22   Pieter Partridge, DO  traZODone (DESYREL) 100 MG tablet Take 100 mg by mouth at bedtime. 09/13/21   [provider]  Vitamin D, Ergocalciferol, (DRISDOL) 1.25 MG (50000 UNIT) CAPS capsule Take 1  capsule (50,000 Units total) by mouth every 7 (seven) days. 03/14/22   Kuneff, Renee A, DO      Allergies    Almond (diagnostic), Codeine, and Hydrocodone    Review of Systems   Review of Systems  Respiratory:  Positive for chest tightness and shortness of breath.   Cardiovascular:  Positive for chest pain.  Gastrointestinal:  Negative for abdominal pain, nausea and vomiting.  Musculoskeletal:  Negative for back pain.  Neurological:  Negative for dizziness.  All other systems reviewed and are negative.   Physical Exam Updated Vital Signs BP (!) 103/51   Pulse 65   Temp 97.8 F (36.6 C)   Resp 17   Ht 5\' 4"  (1.626 m)   Wt 90.7 kg   SpO2 97%   BMI 34.33 kg/m  Physical Exam Vitals and nursing note reviewed.  Constitutional:      General: She is not in acute distress.    Appearance: Normal appearance. She is not ill-appearing, toxic-appearing or diaphoretic.  HENT:     Head: Normocephalic and atraumatic.     Nose: Nose normal. No congestion.     Mouth/Throat:     Mouth: Mucous membranes are moist.     Pharynx: Oropharynx is clear.  Eyes:     Extraocular Movements: Extraocular movements intact.     Conjunctiva/sclera: Conjunctivae normal.     Pupils: Pupils are equal, round, and reactive to light.  Cardiovascular:     Rate and Rhythm: Normal rate and regular rhythm.  Pulmonary:     Effort: Pulmonary effort is normal. No respiratory distress.     Breath sounds: Normal breath sounds. No wheezing or rales.  Abdominal:     General: Abdomen is flat. Bowel sounds are normal.     Palpations: Abdomen is soft.     Tenderness: There is no abdominal tenderness.  Musculoskeletal:     Cervical back: Normal range of motion and neck supple. No tenderness.  Skin:    General: Skin is warm and dry.     Capillary Refill: Capillary refill takes less than 2 seconds.  Neurological:     Mental Status: She is alert and oriented to person, place, and time.     ED Results / Procedures /  Treatments   Labs (all labs ordered are listed, but only abnormal results are displayed) Labs Reviewed  BASIC METABOLIC PANEL - Abnormal; Notable for the following components:      Result Value   CO2 21 (*)    All other components within normal limits  CBC - Abnormal; Notable for the following components:   Platelets 407 (*)    All other components within normal limits  TROPONIN I (HIGH SENSITIVITY)  TROPONIN I (HIGH SENSITIVITY)  TROPONIN I (HIGH SENSITIVITY)    EKG EKG Interpretation  Date/Time:  Monday May 13 2022 12:46:09 EST Ventricular  Rate:  72 PR Interval:  150 QRS Duration: 74 QT Interval:  390 QTC Calculation: 427 R Axis:   59 Text Interpretation: Normal sinus rhythm Nonspecific ST abnormality Abnormal ECG No previous ECGs available Confirmed by Sherwood Gambler [id number]) on 05/13/2022 3:50:40 PM  Radiology DG Chest 2 View  Result Date: 05/13/2022 CLINICAL DATA:  Chest pain.  Shortness of breath. EXAM: CHEST - 2 VIEW COMPARISON:  Chest two views 09/19/2020 FINDINGS: The heart size and mediastinal contours are within normal limits. Both lungs are clear. The visualized skeletal structures are unremarkable. IMPRESSION: No active cardiopulmonary disease. Electronically Signed   By: Yvonne Kendall M.D.   On: 05/13/2022 16:42    Procedures Procedures   Medications Ordered in ED Medications  ipratropium-albuterol (DUONEB) 0.5-2.5 (3) MG/3ML nebulizer solution 3 mL (3 mLs Nebulization Given 05/13/22 1816)  dexamethasone (DECADRON) injection 10 mg (10 mg Intravenous Given 05/13/22 1738)    ED Course/ Medical Decision Making/ A&P Clinical Course as of 05/13/22 1853  Mon May 13, 2022  1701 Glucose: 93 [CG]    Clinical Course User Index [CG] Azucena Cecil, PA-C                           Medical Decision Making Amount and/or Complexity of Data Reviewed Labs: ordered. Decision-making details documented in ED Course. Radiology: ordered.  Risk Prescription  drug management.   46 year old female presents to ED for evaluation of shortness of breath and chest tightness.  Please see HPI for further details.  On my examination the patient is afebrile and nontachycardic.  The patient lung sounds are clear bilaterally, she is not hypoxic.  There is no adventitious lung sounds I am able to appreciate.  The patient abdomen is soft and compressible.  Patient neurological examination shows no focal neurodeficits.  On triage note there was mention of dizziness however the patient denies any dizziness on my examination.  Due to patient complaint we will proceed with the following work-up to include BMP, CBC, troponin x2, EKG, chest x-ray.  Patient treated with DuoNeb, Decadron shot as the patient states that she has been on inhalers and steroids in the past and they have slightly resolved her symptoms.  CBC unremarkable.  The patient BMP is unremarkable.  The patient troponin is 2, less than 2.  Patient chest x-ray unremarkable.  No evidence of consolidations, effusions, cardiomegaly.  The patient EKG is sinus rhythm, nonischemic.  At this time, because of the patient chest tightness, shortness of breath unclear.  Patient is PERC arteria negative.  The patient had a recent CT angio which did not show any evidence of PE.  Patient oxygen saturation 97% on room air.  At this time, no emergent cause of patient symptoms is noted.  The patient will be referred back to her PCP for further management.  The patient is also encouraged to follow back up with pulmonology as previously scheduled.  Return precautions provided, patient voiced understanding.  Patient had all of her questions answered her satisfaction.  The patient is stable for discharge.  Final Clinical Impression(s) / ED Diagnoses Final diagnoses:  Shortness of breath  Chest tightness    Rx / DC Orders ED Discharge Orders     None         Azucena Cecil, PA-C 05/13/22 1853    Sherwood Gambler, MD 05/14/22 1702

## 2022-05-16 DIAGNOSIS — F3111 Bipolar disorder, current episode manic without psychotic features, mild: Secondary | ICD-10-CM | POA: Diagnosis not present

## 2022-05-16 DIAGNOSIS — F411 Generalized anxiety disorder: Secondary | ICD-10-CM | POA: Diagnosis not present

## 2022-05-16 DIAGNOSIS — G4700 Insomnia, unspecified: Secondary | ICD-10-CM | POA: Diagnosis not present

## 2022-05-28 ENCOUNTER — Institutional Professional Consult (permissible substitution): Payer: BC Managed Care – PPO | Admitting: Pulmonary Disease

## 2022-05-29 DIAGNOSIS — M5416 Radiculopathy, lumbar region: Secondary | ICD-10-CM | POA: Diagnosis not present

## 2022-05-31 ENCOUNTER — Telehealth: Payer: Self-pay

## 2022-05-31 NOTE — Telephone Encounter (Signed)
Patient wants to know if she can come in for labs and then make appt to see Dr. Claiborne Billings. She was scheduled for Monday 12/1 "Follow-up in 10-12 weeks on deficiencies"

## 2022-05-31 NOTE — Telephone Encounter (Signed)
Please advise 

## 2022-06-01 DIAGNOSIS — M545 Low back pain, unspecified: Secondary | ICD-10-CM | POA: Diagnosis not present

## 2022-06-03 ENCOUNTER — Ambulatory Visit: Payer: BC Managed Care – PPO | Admitting: Family Medicine

## 2022-06-03 NOTE — Telephone Encounter (Signed)
Pt informed and r/s.

## 2022-06-03 NOTE — Telephone Encounter (Signed)
We cannot perform prelabs here.  Must be drawn at the time of her appointment

## 2022-06-04 DIAGNOSIS — M5416 Radiculopathy, lumbar region: Secondary | ICD-10-CM | POA: Diagnosis not present

## 2022-06-06 ENCOUNTER — Institutional Professional Consult (permissible substitution): Payer: BC Managed Care – PPO | Admitting: Pulmonary Disease

## 2022-06-10 ENCOUNTER — Institutional Professional Consult (permissible substitution): Payer: BC Managed Care – PPO | Admitting: Pulmonary Disease

## 2022-06-10 ENCOUNTER — Ambulatory Visit (INDEPENDENT_AMBULATORY_CARE_PROVIDER_SITE_OTHER): Payer: BC Managed Care – PPO | Admitting: Family Medicine

## 2022-06-10 ENCOUNTER — Encounter: Payer: Self-pay | Admitting: Family Medicine

## 2022-06-10 VITALS — BP 97/62 | HR 66 | Temp 97.7°F | Ht 64.0 in | Wt 206.0 lb

## 2022-06-10 DIAGNOSIS — E538 Deficiency of other specified B group vitamins: Secondary | ICD-10-CM

## 2022-06-10 DIAGNOSIS — E611 Iron deficiency: Secondary | ICD-10-CM

## 2022-06-10 DIAGNOSIS — E559 Vitamin D deficiency, unspecified: Secondary | ICD-10-CM

## 2022-06-10 NOTE — Patient Instructions (Addendum)
Return if symptoms worsen or fail to improve.        Great to see you today.  I have refilled the medication(s) we provide.   If labs were collected, we will inform you of lab results once received either by echart message or telephone call.   - echart message- for normal results that have been seen by the patient already.   - telephone call: abnormal results or if patient has not viewed results in their echart.  

## 2022-06-10 NOTE — Progress Notes (Signed)
Sheila Mathews , 26-Apr-1976, 46 y.o., female MRN: [id number] Patient Care Team    Relationship Specialty Notifications Start End  Ma Hillock, DO PCP - General Family Medicine  05/05/20   Everlene Farrier, MD Consulting Physician Obstetrics and Gynecology  02/05/21   Pieter Partridge, [place] Physician Neurology  02/05/21   Carol Ada, MD Consulting Physician Gastroenterology  02/05/21   Melina Schools, MD Consulting Physician Orthopedic Surgery  02/05/21     Chief Complaint  Patient presents with   vit deficiency      Subjective: Pt presents for an OV to follow up on vit d and b12 deficiency noted on fatigue workup 3 months ago.  It was also noted to have mildly low iron. She is supplementing with vitamin D, B12 and iron today.  She states she still feels fatigued, and uncertain if she notices much of a difference since starting the supplements.  Prior note: with complaints of increased fatigue, brittle hair and weight gain.  She does not take a multivitamin.  She has a history of a thyroid disorder and is compliant with levothyroxine 25 mcg daily x6 days and 100 mcg x 1 day weekly. He is prescribed lithium by her psychiatry team.  They completed lithium levels and thyroid recently and her thyroid levels are mildly elevated around the 6 range.     04/26/2022    2:55 PM 05/05/2020    9:26 AM  Depression screen PHQ 2/9  Decreased Interest 3 1  Down, Depressed, Hopeless 3 1  PHQ - 2 Score 6 2  Altered sleeping  3  Tired, decreased energy  1  Change in appetite  3  Feeling bad or failure about yourself   1  Trouble concentrating  0  Moving slowly or fidgety/restless  0  Suicidal thoughts  0  PHQ-9 Score  10    Allergies  Allergen Reactions   Almond (Diagnostic) Anaphylaxis   Codeine Other (See Comments)    Makes patient aggressive     Hydrocodone Other (See Comments)    Makes patient aggressive    Social History   Social History Narrative   Marital  status/children/pets: Married, 3 children.    Education/employment: Dietitian, healthcare advocate   Safety:      -smoke alarm in the home:Yes     - wears seatbelt: Yes     - Feels safe in their relationships: Yes   Right handed   No caffeine   One story home   Past Medical History:  Diagnosis Date   Abnormal cervical Papanicolaou smear 04/20/2020   Allergies    Anxiety    Bipolar disorder ([place])    Chicken pox    Chronic sinusitis [id number]   Complication of anesthesia    pt doesn't like mask she has panic attack; childhood   Depression    Family history of adverse reaction to anesthesia    Mother PONV   GERD (gastroesophageal reflux disease)    History of anemia    during pregnancy   Migraines    Pelvic pain in female 05/13/2014   Pneumonia    PONV (postoperative nausea and vomiting)    no issues with short procdures, had one isntance of vomitus following a longer procedure    Sciatica    Thyroid disease    Past Surgical History:  Procedure Laterality Date   ABDOMINAL EXPOSURE N/A 09/21/2020   Procedure: LATERAL EXPOSURE FOR OBLIQUE LATERAL SPINE SURGERY;  Surgeon: Carlis Abbott,  Canary Brim, MD;  Location: [facility] OR;  Service: Vascular;  Laterality: N/A;   ANTERIOR LATERAL LUMBAR FUSION WITH PERCUTANEOUS SCREW 1 LEVEL N/A 09/21/2020   Procedure: OBLIQUE LUMBAR INTERBODY FUSION LUMBAR FOUR THROUGH FIVE, POSTERIOR SPINAL FUSION INSTRUMENTATION LUMAR FOUR THROUGH FIVE;  Surgeon: Venita Lick, MD;  Location: [facility] OR;  Service: Orthopedics;  Laterality: N/A;   CESAREAN SECTION  x2   2000, 2005   COLONOSCOPY     03/2022   LAPAROSCOPY N/A 05/13/2014   Procedure: LAPAROSCOPY DIAGNOSTIC;  Surgeon: Lavina Hamman, MD;  Location: [facility] ORS;  Service: Gynecology;  Laterality: N/A;   LUMBAR LAMINECTOMY/DECOMPRESSION MICRODISCECTOMY Left 02/12/2017   Procedure: Microlumbar decompression L5-S1 left ;  Surgeon: Jene Every, MD;  Location: [facility] ORS;  Service: Orthopedics;  Laterality:  Left;  90 mins   SALIVARY GLAND SURGERY  1986   UPPER GI ENDOSCOPY     WISDOM TOOTH EXTRACTION     Family History  Problem Relation Age of Onset   Depression Mother    Mental illness Mother    Diabetes Brother    Drug abuse Brother    Early death Brother    Mental illness Brother    Breast cancer Maternal Grandmother 30   Cancer Maternal Grandfather        head neck    Diabetes Maternal Grandfather    Cancer Paternal Grandmother        gallbladder   Colon cancer Paternal Grandfather 56   Diabetes Paternal Grandfather    Allergies as of 06/10/2022       Reactions   Almond (diagnostic) Anaphylaxis   Codeine Other (See Comments)   Makes patient aggressive    Hydrocodone Other (See Comments)   Makes patient aggressive         Medication List        Accurate as of June 10, 2022  4:28 PM. If you have any questions, ask your nurse or doctor.          STOP taking these medications    Olopatadine HCl 0.2 % Soln Stopped by: Felix Pacini, DO       TAKE these medications    albuterol 108 (90 Base) MCG/ACT inhaler Commonly known as: VENTOLIN HFA Inhale 2 puffs into the lungs every 6 (six) hours as needed for wheezing or shortness of breath.   beclomethasone 40 MCG/ACT inhaler Commonly known as: QVAR Inhale 1 puff into the lungs 2 (two) times daily.   clonazePAM 0.5 MG tablet Commonly known as: KLONOPIN Take 0.5 mg by mouth 3 (three) times daily as needed for anxiety.   fluticasone 50 MCG/ACT nasal spray Commonly known as: FLONASE Place into both nostrils daily.   gabapentin 300 MG capsule Commonly known as: NEURONTIN Take 300 mg by mouth 3 (three) times daily.   ketorolac 0.5 % ophthalmic solution Commonly known as: ACULAR Place 1 drop into both eyes 4 (four) times daily.   lamoTRIgine 200 MG tablet Commonly known as: LAMICTAL Take 200 mg by mouth daily.   levothyroxine 25 MCG tablet Commonly known as: SYNTHROID 1 tab daily p.o. on an empty  stomach x6 days a week, 2 tabs p.o. 1 day a week.   lithium carbonate 300 MG ER tablet Commonly known as: LITHOBID Take 600 mg by mouth daily.   loratadine 10 MG tablet Commonly known as: CLARITIN Take 10 mg by mouth daily.   Mirena (52 MG) 20 MCG/DAY Iud Generic drug: levonorgestrel Mirena 21 mcg/24 hours (8 yrs) 52 mg intrauterine device  Take 1 device by intrauterine route.   sertraline 100 MG tablet Commonly known as: ZOLOFT Take 100 mg by mouth daily.   SUMAtriptan 100 MG tablet Commonly known as: IMITREX Take 1 tablet earliest onset of migraine.  May repeat in 2 hours. Maximum 2 tablets in 24 hours.   traZODone 100 MG tablet Commonly known as: DESYREL Take 100 mg by mouth at bedtime.   Vitamin D (Ergocalciferol) 1.25 MG (50000 UNIT) Caps capsule Commonly known as: DRISDOL Take 1 capsule (50,000 Units total) by mouth every 7 (seven) days.        All past medical history, surgical history, allergies, family history, immunizations andmedications were updated in the EMR today and reviewed under the history and medication portions of their EMR.     ROS Negative, with the exception of above mentioned in HPI   Objective:  BP 97/62   Pulse 66   Temp 97.7 F (36.5 C) (Oral)   Ht 5\' 4"  (1.626 m)   Wt 206 lb (93.4 kg)   SpO2 98%   BMI 35.36 kg/m  Body mass index is 35.36 kg/m. Physical Exam Vitals and nursing note reviewed.  Constitutional:      General: She is not in acute distress.    Appearance: Normal appearance. She is normal weight. She is not ill-appearing or toxic-appearing.  Eyes:     Extraocular Movements: Extraocular movements intact.     Conjunctiva/sclera: Conjunctivae normal.     Pupils: Pupils are equal, round, and reactive to light.  Neurological:     Mental Status: She is alert and oriented to person, place, and time. Mental status is at baseline.  Psychiatric:        Mood and Affect: Mood normal.        Behavior: Behavior normal.         Thought Content: Thought content normal.        Judgment: Judgment normal.      No results found. No results found. No results found for this or any previous visit (from the past 24 hour(s)).  Assessment/Plan: Sheila Mathews is a 46 y.o. female present for OV for  Vit D deficiency: She is supplementing vitamin D, vitamin D levels collected today B12 deficiency She is supplementing with B12, vitamin B12 levels collected today Iron deficiency: She is supplementing with iron supplements, iron levels collected today.   Reviewed expectations re: course of current medical issues. Discussed self-management of symptoms. Outlined signs and symptoms indicating need for more acute intervention. Patient verbalized understanding and all questions were answered. Patient received an After-Visit Summary.    Orders Placed This Encounter  Procedures   B12   Vitamin D (25 hydroxy)   Iron, TIBC and Ferritin Panel   No orders of the defined types were placed in this encounter.  Referral Orders  No referral(s) requested today     Note is dictated utilizing voice recognition software. Although note has been proof read prior to signing, occasional typographical errors still can be missed. If any questions arise, please do not hesitate to call for verification.   electronically signed by:  Howard Pouch, DO  [place]

## 2022-06-11 ENCOUNTER — Telehealth: Payer: Self-pay | Admitting: Family Medicine

## 2022-06-11 LAB — VITAMIN B12: Vitamin B-12: 965 pg/mL (ref 200–1100)

## 2022-06-11 LAB — IRON,TIBC AND FERRITIN PANEL
%SAT: 10 % (calc) — ABNORMAL LOW (ref 16–45)
Ferritin: 20 ng/mL (ref 16–232)
Iron: 48 ug/dL (ref 40–190)
TIBC: 468 mcg/dL (calc) — ABNORMAL HIGH (ref 250–450)

## 2022-06-11 LAB — VITAMIN D 25 HYDROXY (VIT D DEFICIENCY, FRACTURES): Vit D, 25-Hydroxy: 57 ng/mL (ref 30–100)

## 2022-06-11 NOTE — Telephone Encounter (Addendum)
Spoke with pt regarding labs and instructions.   

## 2022-06-11 NOTE — Telephone Encounter (Signed)
Please inform patient her vitamin D and iron levels are excellent. Her iron levels did not improve much and are now on the low end of normal with a low iron saturation.  I believe she is going to need to take an iron supplement at least 2-3 times a week.  I recommended iron polysaccharide 150 mg 3 times weekly.  This can be purchased over-the-counter or on [employer] and is usually better tolerated than the other iron formulations.

## 2022-06-13 DIAGNOSIS — M5416 Radiculopathy, lumbar region: Secondary | ICD-10-CM | POA: Diagnosis not present

## 2022-06-20 DIAGNOSIS — F3132 Bipolar disorder, current episode depressed, moderate: Secondary | ICD-10-CM | POA: Diagnosis not present

## 2022-06-23 DIAGNOSIS — H60333 Swimmer's ear, bilateral: Secondary | ICD-10-CM | POA: Diagnosis not present

## 2022-06-23 DIAGNOSIS — Z1152 Encounter for screening for COVID-19: Secondary | ICD-10-CM | POA: Diagnosis not present

## 2022-06-23 DIAGNOSIS — R0602 Shortness of breath: Secondary | ICD-10-CM | POA: Diagnosis not present

## 2022-06-23 DIAGNOSIS — R0982 Postnasal drip: Secondary | ICD-10-CM | POA: Diagnosis not present

## 2022-06-23 DIAGNOSIS — R6889 Other general symptoms and signs: Secondary | ICD-10-CM | POA: Diagnosis not present

## 2022-06-25 ENCOUNTER — Telehealth: Payer: Self-pay | Admitting: Family Medicine

## 2022-06-25 NOTE — Telephone Encounter (Signed)
Her care looks complex, but that doesn't bother me. I'm happy to accept but she should be warned prior to any appointment with me  that I only have brief appointment slots so I would probably require frequent visits in order to get up to speed on all of her medical issues - schedule her for a few weeks in a row if she is agreeable.  If she prefers to stay with her current Primary Care Provider (PCP) and just see me in consult once to see if she likes me that is ok with me.  Just schedule that as an acute care visit.

## 2022-06-25 NOTE — Telephone Encounter (Signed)
Patient requests to transfer care from Dr. Claiborne Billings to Dr. Jon Billings. Is this okay with you?

## 2022-06-27 NOTE — Telephone Encounter (Signed)
Ok with me 

## 2022-07-03 DIAGNOSIS — Z4889 Encounter for other specified surgical aftercare: Secondary | ICD-10-CM | POA: Diagnosis not present

## 2022-07-03 DIAGNOSIS — M5136 Other intervertebral disc degeneration, lumbar region: Secondary | ICD-10-CM | POA: Diagnosis not present

## 2022-07-08 DIAGNOSIS — J019 Acute sinusitis, unspecified: Secondary | ICD-10-CM | POA: Diagnosis not present

## 2022-07-08 DIAGNOSIS — Z9889 Other specified postprocedural states: Secondary | ICD-10-CM | POA: Diagnosis not present

## 2022-07-08 DIAGNOSIS — M5136 Other intervertebral disc degeneration, lumbar region: Secondary | ICD-10-CM | POA: Diagnosis not present

## 2022-07-08 DIAGNOSIS — M5416 Radiculopathy, lumbar region: Secondary | ICD-10-CM | POA: Diagnosis not present

## 2022-07-08 DIAGNOSIS — M5126 Other intervertebral disc displacement, lumbar region: Secondary | ICD-10-CM | POA: Diagnosis not present

## 2022-07-08 DIAGNOSIS — R042 Hemoptysis: Secondary | ICD-10-CM | POA: Diagnosis not present

## 2022-07-09 ENCOUNTER — Encounter: Payer: BC Managed Care – PPO | Admitting: Internal Medicine

## 2022-07-12 ENCOUNTER — Ambulatory Visit: Payer: BC Managed Care – PPO | Admitting: Internal Medicine

## 2022-07-18 DIAGNOSIS — E611 Iron deficiency: Secondary | ICD-10-CM | POA: Diagnosis not present

## 2022-07-18 DIAGNOSIS — E039 Hypothyroidism, unspecified: Secondary | ICD-10-CM | POA: Diagnosis not present

## 2022-07-18 DIAGNOSIS — Z5181 Encounter for therapeutic drug level monitoring: Secondary | ICD-10-CM | POA: Diagnosis not present

## 2022-07-18 DIAGNOSIS — B0089 Other herpesviral infection: Secondary | ICD-10-CM | POA: Diagnosis not present

## 2022-07-18 DIAGNOSIS — Z0000 Encounter for general adult medical examination without abnormal findings: Secondary | ICD-10-CM | POA: Diagnosis not present

## 2022-07-18 DIAGNOSIS — E782 Mixed hyperlipidemia: Secondary | ICD-10-CM | POA: Diagnosis not present

## 2022-07-25 DIAGNOSIS — F3132 Bipolar disorder, current episode depressed, moderate: Secondary | ICD-10-CM | POA: Diagnosis not present

## 2022-07-29 ENCOUNTER — Encounter: Payer: Self-pay | Admitting: Family Medicine

## 2022-07-29 DIAGNOSIS — Z1231 Encounter for screening mammogram for malignant neoplasm of breast: Secondary | ICD-10-CM

## 2022-07-30 DIAGNOSIS — Z6836 Body mass index (BMI) 36.0-36.9, adult: Secondary | ICD-10-CM | POA: Diagnosis not present

## 2022-07-30 DIAGNOSIS — Z01419 Encounter for gynecological examination (general) (routine) without abnormal findings: Secondary | ICD-10-CM | POA: Diagnosis not present

## 2022-07-30 DIAGNOSIS — Z1231 Encounter for screening mammogram for malignant neoplasm of breast: Secondary | ICD-10-CM | POA: Diagnosis not present

## 2022-08-01 DIAGNOSIS — F411 Generalized anxiety disorder: Secondary | ICD-10-CM | POA: Diagnosis not present

## 2022-08-01 DIAGNOSIS — F3111 Bipolar disorder, current episode manic without psychotic features, mild: Secondary | ICD-10-CM | POA: Diagnosis not present

## 2022-08-05 ENCOUNTER — Other Ambulatory Visit: Payer: Self-pay | Admitting: Obstetrics and Gynecology

## 2022-08-05 DIAGNOSIS — R928 Other abnormal and inconclusive findings on diagnostic imaging of breast: Secondary | ICD-10-CM

## 2022-08-13 ENCOUNTER — Ambulatory Visit: Admission: RE | Admit: 2022-08-13 | Payer: BC Managed Care – PPO | Source: Ambulatory Visit

## 2022-08-13 ENCOUNTER — Ambulatory Visit
Admission: RE | Admit: 2022-08-13 | Discharge: 2022-08-13 | Disposition: A | Discharge status: Home / Self Care | Payer: BC Managed Care – PPO | Source: Ambulatory Visit | Attending: Obstetrics and Gynecology | Admitting: Obstetrics and Gynecology

## 2022-08-13 DIAGNOSIS — R922 Inconclusive mammogram: Secondary | ICD-10-CM | POA: Diagnosis not present

## 2022-08-13 DIAGNOSIS — R928 Other abnormal and inconclusive findings on diagnostic imaging of breast: Secondary | ICD-10-CM

## 2022-08-15 ENCOUNTER — Encounter: Payer: Self-pay | Admitting: Neurology

## 2022-08-15 ENCOUNTER — Ambulatory Visit (INDEPENDENT_AMBULATORY_CARE_PROVIDER_SITE_OTHER): Payer: BC Managed Care – PPO | Admitting: Neurology

## 2022-08-15 ENCOUNTER — Telehealth: Payer: Self-pay

## 2022-08-15 VITALS — BP 98/48 | HR 72 | Ht 64.0 in | Wt 208.0 lb

## 2022-08-15 DIAGNOSIS — G43119 Migraine with aura, intractable, without status migrainosus: Secondary | ICD-10-CM

## 2022-08-15 MED ORDER — EMGALITY 120 MG/ML ~~LOC~~ SOAJ: 240.0000 mg | Freq: Once | SUBCUTANEOUS | 0 refills | Status: AC

## 2022-08-15 NOTE — Patient Instructions (Signed)
Start emgality - 2 injections for first dose, then 1 injection every 28 days thereafter.  Contact me for refill after you pick up initial dose When you get a migraine, take the Zavzpret nasal spray.  try as first line or as second line (to sumatriptan).  Let me know if effective Follow up 4-5 months.

## 2022-08-15 NOTE — Telephone Encounter (Signed)
Patient seen in office for visit, Per Dr.Jaffe please start Emgality.  PA team PA needed for Emgaltiy 120 mg every 28 days

## 2022-08-15 NOTE — Progress Notes (Signed)
NEUROLOGY FOLLOW UP OFFICE NOTE  Sheila Mathews [id number]  Assessment/Plan:   Migraine with aura, without status migrainosus, intractable    Migraine prevention:  Start Emgality Migraine rescue:  sumatriptan 173m.   She will try samples of Zavzpret as first line and as second line to sumatriptan.  She will inform me regarding its efficacy. Limit use of pain relievers to no more than 2 days out of week to prevent risk of rebound or medication-overuse headache. Keep headache diary Follow up 4 to 5 months.  Subjective:  Sheila Witheeis a 47year old right-handed woman history of migraines, panic attacks, anxiety and allergic rhinitis who follows up for migraines.   UPDATE: She started having increased migraines about 2 months ago.  Previously 1 or 2 a month.  Now they are occurring 1 to 2 times a week. With sumatriptan, may last a day but sometimes longer.  Unsure what triggered this.  She has had increased depression.  She thought it may have been due to stopping Abilify and decreasing Lithium dosage.    Tremors stable.  On Lithium.  Thyroid function testing regularly monitored and has been well-controlled.  Intensity:  2-3/10 but may have 10/10 once in awhile. Duration:  1 to 2 hours.   Frequency:  2 days week of period Current NSAIDS:  none Current analgesics:  none Current triptans:  Sumatriptan 1030mCurrent ergotamine:  none Current anti-emetic:  none Current muscle relaxants:  Robaxin Current anti-anxiolytic:  Klonopin Current sleep aide:  none Current Antihypertensive medications:  none Current Antidepressant/antipsychotic/mood medications:  Sertraline 15012mLithium Current Anticonvulsant medications:  lamotrigine 100m38mily Current anti-CGRP:  none Current Vitamins/Herbal/Supplements:  none Current Antihistamines/Decongestants:  Flonase Other therapy:  none Hormone/birth control:  none   Caffeine:  1 cup of coffee per day Alcohol:  Occasionally Smoker:   No Diet:  8 healthy. Drinks a lot of water. Exercise:  Good Depression/stress:  Anxiety controlled Other pain:  Back pain - plan for surgery. Sleep hygiene:  Wakes up during the night and difficulty falling back asleep   HISTORY:  Onset:  14 y24rs old Location:  Usually right-sided, retro-orbital, and moves to the top of the head Quality:  Usually pressure-like, sometimes stabbing Initial Intensity:  Usually 6-7/10, 10 out of 10 for severe Aura:  Sometimes preceded by fuzzy and tunnel vision - when closes eyes she sees flashes of light.  Prodrome:  No Associated symptoms:  First severe, experiences nausea, blurred vision, photophobia, phonophobia, and osmophobia. Initial Duration:  Severe attacks last up to 4 days. Otherwise 2-3 hours Initial Frequency:  Severe attacks occur once a month (4 days per month), otherwise other headaches occur 2 days per week. Total of 15-18 headache days per month. Triggers/exacerbating factors:  Menstrual cycle Relieving factors:  Phenergan/Toradol shots Activity:  Cannot function with severe attacks   For years, she had headaches occurring 2 days a week, lasting 2-3 hours, but had severe migraines once a month lasting 4 days.  In 2019, they started occurring only twice a month but still lasting 4 days..   Past NSAIDS:  Ketorolac, naproxen Past analgesics:  Excedrin, Extra-strength Tylenol Past abortive triptans:  Relpax 40mg3mxalt 10mg,93matriptan injection Past abortive ergotamine:  none Past muscle relaxants:  Flexeril Past anti-emetic:  Zofran 4mg Pa29mantihypertensive medications:  No beta blockers as runs low HR Past antidepressant medications:  amitriptline 10mg (s38meffects), citalopram Past anticonvulsant medications:  Depakote, topiramate, gabapentin (caused brain fog) Past anti-CGRP:  Aimovig (effective),  Ubrelvy 122m (ineffective), Nurtec (rescue- ineffective) Other past therapies:  non     Family history of headache:  No    PAST  MEDICAL HISTORY: Past Medical History:  Diagnosis Date   Abnormal cervical Papanicolaou smear 04/20/2020   Allergies    Anxiety    Bipolar disorder ([place]    Chicken pox    Chronic sinusitis [id number]  Complication of anesthesia    pt doesn't like mask she has panic attack; childhood   Depression    Family history of adverse reaction to anesthesia    Mother PONV   GERD (gastroesophageal reflux disease)    History of anemia    during pregnancy   Migraines    Pelvic pain in female 05/13/2014   Pneumonia    PONV (postoperative nausea and vomiting)    no issues with short procdures, had one isntance of vomitus following a longer procedure    Sciatica    Thyroid disease     MEDICATIONS: Current Outpatient Medications on File Prior to Visit  Medication Sig Dispense Refill   albuterol (VENTOLIN HFA) 108 (90 Base) MCG/ACT inhaler Inhale 2 puffs into the lungs every 6 (six) hours as needed for wheezing or shortness of breath. 18 g 0   beclomethasone (QVAR) 40 MCG/ACT inhaler Inhale 1 puff into the lungs 2 (two) times daily. 1 each 0   clonazePAM (KLONOPIN) 0.5 MG tablet Take 0.5 mg by mouth 3 (three) times daily as needed for anxiety.     fluticasone (FLONASE) 50 MCG/ACT nasal spray Place into both nostrils daily.     gabapentin (NEURONTIN) 300 MG capsule Take 300 mg by mouth 3 (three) times daily.     ketorolac (ACULAR) 0.5 % ophthalmic solution Place 1 drop into both eyes 4 (four) times daily. 5 mL 3   lamoTRIgine (LAMICTAL) 200 MG tablet Take 200 mg by mouth daily.     levonorgestrel (MIRENA, 52 MG,) 20 MCG/DAY IUD Mirena 21 mcg/24 hours (8 yrs) 52 mg intrauterine device  Take 1 device by intrauterine route.     levothyroxine (SYNTHROID) 25 MCG tablet 1 tab daily p.o. on an empty stomach x6 days a week, 2 tabs p.o. 1 day a week. 102 tablet 3   lithium carbonate (LITHOBID) 300 MG CR tablet Take 600 mg by mouth daily.     loratadine (CLARITIN) 10 MG tablet Take 10 mg by mouth daily.      sertraline (ZOLOFT) 100 MG tablet Take 100 mg by mouth daily.     SUMAtriptan (IMITREX) 100 MG tablet Take 1 tablet earliest onset of migraine.  May repeat in 2 hours. Maximum 2 tablets in 24 hours. 10 tablet 5   traZODone (DESYREL) 100 MG tablet Take 100 mg by mouth at bedtime.     No current facility-administered medications on file prior to visit.    ALLERGIES: Allergies  Allergen Reactions   Almond (Diagnostic) Anaphylaxis   Codeine Other (See Comments)    Makes patient aggressive     Hydrocodone Other (See Comments)    Makes patient aggressive     FAMILY HISTORY: Family History  Problem Relation Age of Onset   Depression Mother    Mental illness Mother    Breast cancer Maternal Aunt 647  Breast cancer Maternal Grandmother 338  Cancer Maternal Grandfather        head neck    Diabetes Maternal Grandfather    Cancer Paternal Grandmother        gallbladder  Colon cancer Paternal Grandfather 51   Diabetes Paternal Grandfather    Diabetes Brother    Drug abuse Brother    Early death Brother    Mental illness Brother       Objective:  Blood pressure (!) 98/48, pulse 72, height 5' 4"$  (1.626 m), weight 208 lb (94.3 kg), SpO2 96 %. General: No acute distress.  Patient appears well-groomed.   Head:  Normocephalic/atraumatic Eyes:  Fundi examined but not visualized Neck: supple, no paraspinal tenderness, full range of motion Heart:  Regular rate and rhythm Lungs:  Clear to auscultation bilaterally Back: No paraspinal tenderness Neurological Exam: alert and oriented to person, place, and time.  Speech fluent and not dysarthric, language intact.  CN II-XII intact. Bulk and tone normal, muscle strength 5/5 throughout.  Sensation to light touch intact.  Deep tendon reflexes 2+ throughout, toes downgoing.  Finger to nose testing intact.  Gait normal, Romberg negative.   Sheila Clines, DO  CC:  Kevan Rosebush, PA-C

## 2022-08-15 NOTE — Progress Notes (Signed)
Medication Samples have been provided to the patient.  Drug name: ZaVZpret       Strength: 74m        Qty: 3  LOT:[id number] Exp.Date: 12/2023  Dosing instructions: as needed  The patient has been instructed regarding the correct time, dose, and frequency of taking this medication, including desired effects and most common side effects.   SVenetia Night10:41 AM 08/15/2022

## 2022-08-16 ENCOUNTER — Telehealth: Payer: Self-pay

## 2022-08-16 NOTE — Telephone Encounter (Signed)
PA has been submitted and will be updated in additional encounter created.

## 2022-08-16 NOTE — Telephone Encounter (Signed)
PA need for Emgality 120 mg

## 2022-08-16 NOTE — Telephone Encounter (Signed)
PA request received via provider/CMM for Emgality 120MG/ML auto-injectors (migraine)  PA has been submitted to [facility] and is pending determination.   Key: [id number]

## 2022-09-02 ENCOUNTER — Other Ambulatory Visit (HOSPITAL_COMMUNITY): Payer: Self-pay

## 2022-09-02 NOTE — Telephone Encounter (Signed)
Patient Advocate Encounter  [name] (migraine) has been approved.    PA# [id number] Insurance [place], [place] Patient Advocate Specialist [place] Patient Advocate Team Direct Number: [phone]  Fax: [phone]

## 2022-09-03 NOTE — Telephone Encounter (Signed)
Patient advised.

## 2022-09-05 NOTE — Progress Notes (Signed)
Synopsis: Referred in March 2024 for pulmonary nodule by Ma Hillock, DO  Subjective:   PATIENT ID: Sheila Mathews GENDER: female DOB: 05-02-1976, MRN: [id number]  Chief Complaint  Patient presents with   Consult    Lung nodule. Cough, sob.    This is a 47 year old female, past medical history of depression, gastroesophageal reflux prior history of pneumonia.Patient was referred after having CT imaging of the chest on 04/08/2022.  She had a ill-defined 8 x 9 mm posterior left lower lobe nodule that was slightly smaller in size in comparison to previous images. Patient also complains of on going intermittent chest tightness and shortness of breath.  She has been using her daughters old albuterol inhaler that has improved her symptoms at times.  She is never been told that she has asthma but has had some intermittent symptoms over the years.     Past Medical History:  Diagnosis Date   Abnormal cervical Papanicolaou smear 04/20/2020   Allergies    Anxiety    Bipolar disorder ([place])    Chicken pox    Chronic sinusitis 70/62/3762   Complication of anesthesia    pt doesn't like mask she has panic attack; childhood   Depression    Family history of adverse reaction to anesthesia    Mother PONV   GERD (gastroesophageal reflux disease)    History of anemia    during pregnancy   Migraines    Nonsmoker    Pelvic pain in female 05/13/2014   Pneumonia    PONV (postoperative nausea and vomiting)    no issues with short procdures, had one isntance of vomitus following a longer procedure    Sciatica    Thyroid disease      Family History  Problem Relation Age of Onset   Depression Mother    Mental illness Mother    Diabetes Brother    Drug abuse Brother    Early death Brother    Mental illness Brother    Breast cancer Maternal Grandmother 73   Cancer Maternal Grandfather        head neck    Diabetes Maternal Grandfather    Lung cancer Maternal Grandfather    Cancer  Paternal Grandmother        gallbladder   Colon cancer Paternal Grandfather 37   Diabetes Paternal Grandfather    Breast cancer Maternal Aunt 65     Past Surgical History:  Procedure Laterality Date   ABDOMINAL EXPOSURE N/A 09/21/2020   Procedure: LATERAL EXPOSURE FOR OBLIQUE LATERAL SPINE SURGERY;  Surgeon: Marty Heck, MD;  Location: [facility];  Service: Vascular;  Laterality: N/A;   ANTERIOR LATERAL LUMBAR FUSION WITH PERCUTANEOUS SCREW 1 LEVEL N/A 09/21/2020   Procedure: OBLIQUE LUMBAR INTERBODY FUSION LUMBAR FOUR THROUGH FIVE, POSTERIOR SPINAL FUSION INSTRUMENTATION [place];  Surgeon: Melina Schools, MD;  Location: [place];  Service: Orthopedics;  Laterality: N/A;   CESAREAN SECTION  x2   2000, 2005   COLONOSCOPY     03/2022   LAPAROSCOPY N/A 05/13/2014   Procedure: LAPAROSCOPY DIAGNOSTIC;  Surgeon: Cheri Fowler, MD;  Location: [facility];  Service: Gynecology;  Laterality: N/A;   LUMBAR LAMINECTOMY/DECOMPRESSION MICRODISCECTOMY Left 02/12/2017   Procedure: Microlumbar decompression L5-S1 left ;  Surgeon: Susa Day, MD;  Location: [facility];  Service: Orthopedics;  Laterality: Left;  90 mins   SALIVARY GLAND SURGERY  1986   UPPER GI ENDOSCOPY     WISDOM TOOTH EXTRACTION  Social History   Socioeconomic History   Marital status: Married    Spouse name: Not on file   Number of children: 3   Years of education: Not on file   Highest education level: Bachelor's degree (e.g., BA, AB, BS)  Occupational History   Not on file  Tobacco Use   Smoking status: Never   Smokeless tobacco: Never  Vaping Use   Vaping Use: Never used  Substance and Sexual Activity   Alcohol use: Yes    Comment: social   Drug use: No   Sexual activity: Yes    Partners: Male  Other Topics Concern   Not on file  Social History Narrative   Marital status/children/pets: Married, 3 children.    Education/employment: Dietitian, healthcare advocate   Safety:      -smoke  alarm in the home:Yes     - wears seatbelt: Yes     - Feels safe in their relationships: Yes   Right handed   No caffeine   One story home   Social Determinants of Health   Financial Resource Strain: Not on file  Food Insecurity: Not on file  Transportation Needs: Not on file  Physical Activity: Not on file  Stress: Not on file  Social Connections: Not on file  Intimate Partner Violence: Not on file     Allergies  Allergen Reactions   Almond (Diagnostic) Anaphylaxis   Codeine Other (See Comments)    Makes patient aggressive     Hydrocodone Other (See Comments)    Makes patient aggressive      Outpatient Medications Prior to Visit  Medication Sig Dispense Refill   albuterol (VENTOLIN HFA) 108 (90 Base) MCG/ACT inhaler Inhale 2 puffs into the lungs every 6 (six) hours as needed for wheezing or shortness of breath. 18 g 0   cetirizine (ZYRTEC) 10 MG tablet Take 1 tablet by mouth daily.     clonazePAM (KLONOPIN) 0.5 MG tablet Take 0.5 mg by mouth 3 (three) times daily as needed for anxiety.     esomeprazole (NEXIUM) 20 MG capsule Take 20 mg by mouth daily at 12 noon.     ferrous sulfate 325 (65 FE) MG tablet Take 325 mg by mouth daily with breakfast.     fluticasone (FLONASE) 50 MCG/ACT nasal spray Place into both nostrils daily.     gabapentin (NEURONTIN) 300 MG capsule Take 300 mg by mouth 3 (three) times daily.     lamoTRIgine (LAMICTAL) 200 MG tablet Take 200 mg by mouth daily.     levonorgestrel (MIRENA, 52 MG,) 20 MCG/DAY IUD Mirena 21 mcg/24 hours (8 yrs) 52 mg intrauterine device  Take 1 device by intrauterine route.     levothyroxine (SYNTHROID) 25 MCG tablet 1 tab daily p.o. on an empty stomach x6 days a week, 2 tabs p.o. 1 day a week. 102 tablet 3   lithium carbonate (LITHOBID) 300 MG CR tablet Take 600 mg by mouth daily.     Sertraline HCl 150 MG CAPS Take 100 mg by mouth daily.     SUMAtriptan (IMITREX) 100 MG tablet Take 1 tablet earliest onset of migraine.  May  repeat in 2 hours. Maximum 2 tablets in 24 hours. 10 tablet 5   traZODone (DESYREL) 100 MG tablet Take 100 mg by mouth at bedtime.     No facility-administered medications prior to visit.    Review of Systems  Constitutional:  Negative for chills, fever, malaise/fatigue and weight loss.  HENT:  Negative for  hearing loss, sore throat and tinnitus.   Eyes:  Negative for blurred vision and double vision.  Respiratory:  Positive for cough and shortness of breath. Negative for hemoptysis, sputum production, wheezing and stridor.        Associated chest tightness  Cardiovascular:  Negative for chest pain, palpitations, orthopnea, leg swelling and PND.  Gastrointestinal:  Negative for abdominal pain, constipation, diarrhea, heartburn, nausea and vomiting.  Genitourinary:  Negative for dysuria, hematuria and urgency.  Musculoskeletal:  Negative for joint pain and myalgias.  Skin:  Negative for itching and rash.  Neurological:  Negative for dizziness, tingling, weakness and headaches.  Endo/Heme/Allergies:  Negative for environmental allergies. Does not bruise/bleed easily.  Psychiatric/Behavioral:  Negative for depression. The patient is not nervous/anxious and does not have insomnia.   All other systems reviewed and are negative.    Objective:  Physical Exam Vitals reviewed.  Constitutional:      General: She is not in acute distress.    Appearance: She is well-developed. She is obese.  HENT:     Head: Normocephalic and atraumatic.  Eyes:     General: No scleral icterus.    Conjunctiva/sclera: Conjunctivae normal.     Pupils: Pupils are equal, round, and reactive to light.  Neck:     Vascular: No JVD.     Trachea: No tracheal deviation.  Cardiovascular:     Rate and Rhythm: Normal rate and regular rhythm.     Heart sounds: Normal heart sounds. No murmur heard. Pulmonary:     Effort: Pulmonary effort is normal. No tachypnea, accessory muscle usage or respiratory distress.      Breath sounds: No stridor. No wheezing, rhonchi or rales.  Abdominal:     General: There is no distension.     Palpations: Abdomen is soft.     Tenderness: There is no abdominal tenderness.  Musculoskeletal:        General: No tenderness.     Cervical back: Neck supple.  Lymphadenopathy:     Cervical: No cervical adenopathy.  Skin:    General: Skin is warm and dry.     Capillary Refill: Capillary refill takes less than 2 seconds.     Findings: No rash.  Neurological:     Mental Status: She is alert and oriented to person, place, and time.  Psychiatric:        Behavior: Behavior normal.      Vitals:   09/06/22 1102  BP: 100/70  Pulse: 77  SpO2: 98%  Weight: 209 lb 12.8 oz (95.2 kg)  Height: 5\' 4"  (1.626 m)   98% on RA BMI Readings from Last 3 Encounters:  09/06/22 36.01 kg/m  08/15/22 35.70 kg/m  06/10/22 35.36 kg/m   Wt Readings from Last 3 Encounters:  09/06/22 209 lb 12.8 oz (95.2 kg)  08/15/22 208 lb (94.3 kg)  06/10/22 206 lb (93.4 kg)     CBC    Component Value Date/Time   WBC 9.7 05/13/2022 1252   RBC 4.71 05/13/2022 1252   HGB 12.6 05/13/2022 1252   HCT 40.2 05/13/2022 1252   PLT 407 (H) 05/13/2022 1252   MCV 85.4 05/13/2022 1252   MCH 26.8 05/13/2022 1252   MCHC 31.3 05/13/2022 1252   RDW 15.2 05/13/2022 1252   LYMPHSABS 1.2 04/08/2022 1206   MONOABS 0.4 04/08/2022 1206   EOSABS 0.0 04/08/2022 1206   BASOSABS 0.0 04/08/2022 1206     Chest Imaging: October 2023 CT chest: Left lower lobe 9 mm pulmonary  nodule. The patient's images have been independently reviewed by me.    Pulmonary Functions Testing Results:     No data to display          FeNO:   Pathology:   Echocardiogram:   Heart Catheterization:     Assessment & Plan:     ICD-10-CM   1. Chest tightness  R07.89 CT Chest Wo Contrast    2. SOB (shortness of breath)  R06.02 CT Chest Wo Contrast    3. Lung nodule  R91.1 CT Chest Wo Contrast    4. Second hand smoke  exposure  Z77.22       Discussion:  This is a 46 year old female, incidentally found left lower lobe pulmonary nodule measuring 9 mm in size this was back in October 2023.  Patient also has some shortness of breath and occasional chest tightness.  She has significant secondhand smoke exposure related to family.  This was for several years while growing up.  Plan: Will recommend repeat noncontrast CT chest in 6 months which will be April 2024. She can follow-up with [place] after that CT is complete. As for her ongoing chest tightness shortness of breath symptoms I think this is possibly related to underlying diagnosis of asthma.  She has not had an asthma diagnosis in the past we will give her a trial of albuterol as well as Breo 100. See how she does with her new inhaler regimen in 6 weeks after her follow-up is complete with her CT. RTC in clinic with me or APP in April 2024.   Current Outpatient Medications:    albuterol (VENTOLIN HFA) 108 (90 Base) MCG/ACT inhaler, Inhale 2 puffs into the lungs every 6 (six) hours as needed for wheezing or shortness of breath., Disp: 18 g, Rfl: 0   albuterol (VENTOLIN HFA) 108 (90 Base) MCG/ACT inhaler, Inhale 2 puffs into the lungs every 6 (six) hours as needed for wheezing or shortness of breath., Disp: 8 g, Rfl: 6   cetirizine (ZYRTEC) 10 MG tablet, Take 1 tablet by mouth daily., Disp: , Rfl:    clonazePAM (KLONOPIN) 0.5 MG tablet, Take 0.5 mg by mouth 3 (three) times daily as needed for anxiety., Disp: , Rfl:    esomeprazole (NEXIUM) 20 MG capsule, Take 20 mg by mouth daily at 12 noon., Disp: , Rfl:    ferrous sulfate 325 (65 FE) MG tablet, Take 325 mg by mouth daily with breakfast., Disp: , Rfl:    fluticasone (FLONASE) 50 MCG/ACT nasal spray, Place into both nostrils daily., Disp: , Rfl:    fluticasone furoate-vilanterol (BREO ELLIPTA) 100-25 MCG/ACT AEPB, Inhale 1 puff into the lungs daily., Disp: 1 each, Rfl: 6   gabapentin (NEURONTIN) 300 MG capsule,  Take 300 mg by mouth 3 (three) times daily., Disp: , Rfl:    lamoTRIgine (LAMICTAL) 200 MG tablet, Take 200 mg by mouth daily., Disp: , Rfl:    levonorgestrel (MIRENA, 52 MG,) 20 MCG/DAY IUD, Mirena 21 mcg/24 hours (8 yrs) 52 mg intrauterine device  Take 1 device by intrauterine route., Disp: , Rfl:    levothyroxine (SYNTHROID) 25 MCG tablet, 1 tab daily p.o. on an empty stomach x6 days a week, 2 tabs p.o. 1 day a week., Disp: 102 tablet, Rfl: 3   lithium carbonate (LITHOBID) 300 MG CR tablet, Take 600 mg by mouth daily., Disp: , Rfl:    Sertraline HCl 150 MG CAPS, Take 100 mg by mouth daily., Disp: , Rfl:    SUMAtriptan (IMITREX) 100  MG tablet, Take 1 tablet earliest onset of migraine.  May repeat in 2 hours. Maximum 2 tablets in 24 hours., Disp: 10 tablet, Rfl: 5   traZODone (DESYREL) 100 MG tablet, Take 100 mg by mouth at bedtime., Disp: , Rfl:    Garner Nash, DO [facility] 09/06/2022 11:38 AM

## 2022-09-06 ENCOUNTER — Ambulatory Visit (INDEPENDENT_AMBULATORY_CARE_PROVIDER_SITE_OTHER): Payer: BC Managed Care – PPO | Admitting: Pulmonary Disease

## 2022-09-06 ENCOUNTER — Encounter: Payer: Self-pay | Admitting: Pulmonary Disease

## 2022-09-06 VITALS — BP 100/70 | HR 77 | Ht 64.0 in | Wt 209.8 lb

## 2022-09-06 DIAGNOSIS — Z7722 Contact with and (suspected) exposure to environmental tobacco smoke (acute) (chronic): Secondary | ICD-10-CM

## 2022-09-06 DIAGNOSIS — R0602 Shortness of breath: Secondary | ICD-10-CM | POA: Diagnosis not present

## 2022-09-06 DIAGNOSIS — R0789 Other chest pain: Secondary | ICD-10-CM

## 2022-09-06 DIAGNOSIS — R911 Solitary pulmonary nodule: Secondary | ICD-10-CM | POA: Diagnosis not present

## 2022-09-06 MED ORDER — ALBUTEROL SULFATE HFA 108 (90 BASE) MCG/ACT IN AERS: 2.0000 | INHALATION_SPRAY | Freq: Four times a day (QID) | RESPIRATORY_TRACT | 6 refills | Status: DC | PRN

## 2022-09-06 MED ORDER — FLUTICASONE FUROATE-VILANTEROL 100-25 MCG/ACT IN AEPB: 1.0000 | INHALATION_SPRAY | Freq: Every day | RESPIRATORY_TRACT | 6 refills | Status: DC

## 2022-09-06 NOTE — Patient Instructions (Signed)
Thank you for visiting Dr. Valeta Harms at [facility]. Today we recommend the following:  Orders Placed This Encounter  Procedures   CT Chest Wo Contrast   Meds ordered this encounter  Medications   albuterol (VENTOLIN HFA) 108 (90 Base) MCG/ACT inhaler    Sig: Inhale 2 puffs into the lungs every 6 (six) hours as needed for wheezing or shortness of breath.    Dispense:  8 g    Refill:  6   fluticasone furoate-vilanterol (BREO ELLIPTA) 100-25 MCG/ACT AEPB    Sig: Inhale 1 puff into the lungs daily.    Dispense:  1 each    Refill:  6   Return in about 6 weeks (around 10/18/2022) for with Eric Form, NP, or Dr. Valeta Harms.    Please do your part to reduce the spread of COVID-19.

## 2022-09-09 DIAGNOSIS — M5416 Radiculopathy, lumbar region: Secondary | ICD-10-CM | POA: Diagnosis not present

## 2022-09-13 ENCOUNTER — Telehealth: Payer: Self-pay | Admitting: Pulmonary Disease

## 2022-09-13 NOTE — Telephone Encounter (Signed)
Called PT to set up FU appt but CT needs to be set up so Dr. Lilian Coma review on ret visit. Notified PCC's via tel encounter.-TO

## 2022-09-25 NOTE — Telephone Encounter (Signed)
b

## 2022-09-30 HISTORY — PX: SPINAL CORD STIMULATOR REMOVAL: SHX2423

## 2022-10-01 DIAGNOSIS — F3132 Bipolar disorder, current episode depressed, moderate: Secondary | ICD-10-CM | POA: Diagnosis not present

## 2022-10-11 DIAGNOSIS — M5386 Other specified dorsopathies, lumbar region: Secondary | ICD-10-CM | POA: Diagnosis not present

## 2022-10-11 DIAGNOSIS — G894 Chronic pain syndrome: Secondary | ICD-10-CM | POA: Diagnosis not present

## 2022-10-11 DIAGNOSIS — M961 Postlaminectomy syndrome, not elsewhere classified: Secondary | ICD-10-CM | POA: Diagnosis not present

## 2022-10-11 DIAGNOSIS — M5416 Radiculopathy, lumbar region: Secondary | ICD-10-CM | POA: Diagnosis not present

## 2022-10-14 ENCOUNTER — Ambulatory Visit (HOSPITAL_BASED_OUTPATIENT_CLINIC_OR_DEPARTMENT_OTHER): Payer: BC Managed Care – PPO

## 2022-10-18 ENCOUNTER — Ambulatory Visit: Payer: BC Managed Care – PPO | Admitting: Acute Care

## 2022-10-23 DIAGNOSIS — Z20822 Contact with and (suspected) exposure to covid-19: Secondary | ICD-10-CM | POA: Diagnosis not present

## 2022-10-23 DIAGNOSIS — R051 Acute cough: Secondary | ICD-10-CM | POA: Diagnosis not present

## 2022-10-23 DIAGNOSIS — J069 Acute upper respiratory infection, unspecified: Secondary | ICD-10-CM | POA: Diagnosis not present

## 2022-10-23 DIAGNOSIS — R21 Rash and other nonspecific skin eruption: Secondary | ICD-10-CM | POA: Diagnosis not present

## 2022-10-24 ENCOUNTER — Ambulatory Visit (HOSPITAL_BASED_OUTPATIENT_CLINIC_OR_DEPARTMENT_OTHER)
Admission: RE | Admit: 2022-10-24 | Discharge: 2022-10-24 | Disposition: A | Discharge status: Home / Self Care | Payer: BC Managed Care – PPO | Source: Ambulatory Visit | Attending: Pulmonary Disease | Admitting: Pulmonary Disease

## 2022-10-24 DIAGNOSIS — R0789 Other chest pain: Secondary | ICD-10-CM | POA: Insufficient documentation

## 2022-10-24 DIAGNOSIS — R918 Other nonspecific abnormal finding of lung field: Secondary | ICD-10-CM | POA: Diagnosis not present

## 2022-10-24 DIAGNOSIS — R911 Solitary pulmonary nodule: Secondary | ICD-10-CM | POA: Diagnosis not present

## 2022-10-24 DIAGNOSIS — R0602 Shortness of breath: Secondary | ICD-10-CM | POA: Insufficient documentation

## 2022-10-24 DIAGNOSIS — F3111 Bipolar disorder, current episode manic without psychotic features, mild: Secondary | ICD-10-CM | POA: Diagnosis not present

## 2022-10-24 DIAGNOSIS — F411 Generalized anxiety disorder: Secondary | ICD-10-CM | POA: Diagnosis not present

## 2022-10-30 ENCOUNTER — Other Ambulatory Visit: Payer: Self-pay

## 2022-10-30 ENCOUNTER — Telehealth: Payer: Self-pay | Admitting: Anesthesiology

## 2022-10-30 MED ORDER — SUMATRIPTAN SUCCINATE 100 MG PO TABS: ORAL_TABLET | ORAL | 5 refills | Status: DC

## 2022-10-30 NOTE — Telephone Encounter (Signed)
Pt called requesting a refill on her medication  1. Which medications need refilled? Sumatriptan 100 mg   2. Which pharmacy/location is medication to be sent to? [facility] [place], [place]  3. Do they need a 30 day or 90 day supply? 30 day

## 2022-10-30 NOTE — Telephone Encounter (Signed)
Refills sent

## 2022-10-31 DIAGNOSIS — M546 Pain in thoracic spine: Secondary | ICD-10-CM | POA: Diagnosis not present

## 2022-11-02 NOTE — Progress Notes (Signed)
Sheila Mathews, seeing you in clinic this week.  Multiple pulmonary nodules appear stable.  Thanks,  BLI  Josephine Igo, DO [facility] 11/02/2022 5:33 PM

## 2022-11-05 ENCOUNTER — Encounter: Payer: Self-pay | Admitting: Acute Care

## 2022-11-05 ENCOUNTER — Ambulatory Visit (INDEPENDENT_AMBULATORY_CARE_PROVIDER_SITE_OTHER): Payer: BC Managed Care – PPO | Admitting: Acute Care

## 2022-11-05 VITALS — BP 122/76 | HR 75 | Temp 98.3°F | Ht 64.0 in | Wt 206.0 lb

## 2022-11-05 DIAGNOSIS — J4530 Mild persistent asthma, uncomplicated: Secondary | ICD-10-CM | POA: Diagnosis not present

## 2022-11-05 DIAGNOSIS — T7840XA Allergy, unspecified, initial encounter: Secondary | ICD-10-CM

## 2022-11-05 DIAGNOSIS — J45909 Unspecified asthma, uncomplicated: Secondary | ICD-10-CM

## 2022-11-05 DIAGNOSIS — R911 Solitary pulmonary nodule: Secondary | ICD-10-CM

## 2022-11-05 HISTORY — DX: Unspecified asthma, uncomplicated: J45.909

## 2022-11-05 MED ORDER — MOMETASONE FURO-FORMOTEROL FUM 100-5 MCG/ACT IN AERO: 2.0000 | INHALATION_SPRAY | Freq: Two times a day (BID) | RESPIRATORY_TRACT | 3 refills | Status: DC

## 2022-11-05 NOTE — Progress Notes (Signed)
History of Present Illness Sheila Mathews is a 47 y.o. female with past medical history of depression, gastroesophageal reflux and prior history of pneumonia, referred 08/2022 for evaluation of pulmonary nodule by Dr. Tonia Brooms. She is a never smoker but had significant second hand smoke exposure while growing up.  Synopsis 47 year old female, past medical history of depression, gastroesophageal reflux prior history of pneumonia.Patient was referred after having CT imaging of the chest on 04/08/2022. She had a ill-defined 8 x 9 mm posterior left lower lobe nodule that was slightly smaller in size in comparison to previous images. Patient also complains of on going intermittent chest tightness and shortness of breath. She has been using her daughters old albuterol inhaler that has improved her symptoms at times. She is never been told that she has asthma but has had some intermittent symptoms over the years. Dr. Tonia Brooms gave her a trial of Breo which she feels has helped her significantly, and she is using it daily with improvement of symptoms.She has had what we think is an allergic reaction to the [facility], she is going to take a break from all inhalers, and restart with [facility] in a few days.   11/05/2022 Pt. Presents for follow up after repeat CT Chest. She is doing well on the [facility] and breathing is better.She states she is now has hives and a very hoarse voice. She thinks they are a result of her Breo inhaler. I have asked her to stop the [facility]. The hives are on her back, stomach, and back of legs. We discussed a prednisone taper, but she recently had one through her PCP and she does not want another. She thinks this may be from the new inhaler. She is willing to try a different inhaler.  I will order Dulera, but I have asked her to wait a few days before starting the new inhaler so we can see if the hives improve.She has agreed to do this.   We reviewed her scan results.There has been some slight growth to the  nodule of concern. We discussed the option or PET scan now or  6 month follow up CT as surveillance. She wants to do the PET scan now as she wants to know what this is so she can stop worrying about it. I have ordered the PET scan. We will have her follow up after PET to review the results.  Test Results: 10/24/2022 CT Chest  Left lower lobe pulmonary nodule measures 10 x 9 mm, previously 9 x 8 mm on chest CT and 11 x 9 mm on abdominal CT. Margins are microlobulated. While morphology is suspicious for neoplasm, stability over the course of 9 months is reassuring. Recommend either 6 month chest CT follow-up or PET characterization. 2. An area of subpleural ground-glass in the dependent left lower lobe is slightly smaller than on prior exams. This may represent an area of scarring.     Latest Ref Rng & Units 05/13/2022   12:52 PM 04/08/2022   12:06 PM 01/28/2022    4:33 AM  CBC  WBC 4.0 - 10.5 K/uL 9.7  7.2  13.0   Hemoglobin 12.0 - 15.0 g/dL 16.1  09.6  04.5   Hematocrit 36.0 - 46.0 % 40.2  36.5  38.9   Platelets 150 - 400 K/uL 407  356.0  418        Latest Ref Rng & Units 05/13/2022   12:52 PM 04/08/2022   12:06 PM 01/28/2022    4:33 AM  BMP  Glucose 70 - 99 mg/dL 93  96  409   BUN 6 - 20 mg/dL 12  9  10    Creatinine 0.44 - 1.00 mg/dL 8.11  9.14  7.82   Sodium 135 - 145 mmol/L 137  138  137   Potassium 3.5 - 5.1 mmol/L 4.1  4.5  3.7   Chloride 98 - 111 mmol/L 104  105  105   CO2 22 - 32 mmol/L 21  25  22    Calcium 8.9 - 10.3 mg/dL 95.6  9.5  9.5     BNP No results found for: "BNP"  ProBNP No results found for: "PROBNP"  PFT No results found for: "FEV1PRE", "FEV1POST", "FVCPRE", "FVCPOST", "TLC", "DLCOUNC", "PREFEV1FVCRT", "PSTFEV1FVCRT"  CT Chest Wo Contrast  Result Date: 10/29/2022 CLINICAL DATA:  Chest tightness. Shortness of breath. Follow-up lung nodule. EXAM: CT CHEST WITHOUT CONTRAST TECHNIQUE: Multidetector CT imaging of the chest was performed following the  standard protocol without IV contrast. RADIATION DOSE REDUCTION: This exam was performed according to the departmental dose-optimization program which includes automated exposure control, adjustment of the mA and/or kV according to patient size and/or use of iterative reconstruction technique. COMPARISON:  Chest CT 02/06/2022, included lung bases from abdominal CT 09/31/2023 FINDINGS: Cardiovascular: Mild cardiomegaly. No pericardial effusion. Normal caliber thoracic aorta. Mediastinum/Nodes: No enlarged mediastinal lymph nodes. Patulous distal esophagus, no wall thickening. Heterogeneous thyroid gland but no discrete nodule. Lungs/Pleura: Left lower lobe nodule measures 10 x 9 mm, series 4, image 103, previously 9 x 8 mm on chest CT and 11 x 9 mm on abdominal CT. Margins are microlobulated. An area of subpleural ground-glass in the dependent left lower lobe series 4, image 112 is slightly smaller than on prior exams. No new or enlarging pulmonary nodules. No pleural fluid. Trachea and central airways are clear. Upper Abdomen: No acute findings. Vague area of decreased density within the central left hepatic lobe corresponding to hypodensity on prior abdominal CT, grossly stable on this unenhanced exam. Elongated spleen. Musculoskeletal: There are no acute or suspicious osseous abnormalities. IMPRESSION: 1. Left lower lobe pulmonary nodule measures 10 x 9 mm, previously 9 x 8 mm on chest CT and 11 x 9 mm on abdominal CT. Margins are microlobulated. While morphology is suspicious for neoplasm, stability over the course of 9 months is reassuring. Recommend either 6 month chest CT follow-up or PET characterization. 2. An area of subpleural ground-glass in the dependent left lower lobe is slightly smaller than on prior exams. This may represent an area of scarring. Electronically Signed   By: Narda Rutherford M.D.   On: 10/29/2022 17:11     Past medical hx Past Medical History:  Diagnosis Date   Abnormal cervical  Papanicolaou smear 04/20/2020   Allergies    Anxiety    Bipolar disorder (HCC)    Chicken pox    Chronic sinusitis 07/26/2016   Complication of anesthesia    pt doesn't like mask she has panic attack; childhood   Depression    Family history of adverse reaction to anesthesia    Mother PONV   GERD (gastroesophageal reflux disease)    History of anemia    during pregnancy   Migraines    Nonsmoker    Pelvic pain in female 05/13/2014   Pneumonia    PONV (postoperative nausea and vomiting)    no issues with short procdures, had one isntance of vomitus following a longer procedure    Sciatica    Thyroid disease  Social History   Tobacco Use   Smoking status: Never   Smokeless tobacco: Never  Vaping Use   Vaping Use: Never used  Substance Use Topics   Alcohol use: Yes    Comment: social   Drug use: No    Ms.Medlock reports that she has never smoked. She has never used smokeless tobacco. She reports current alcohol use. She reports that she does not use drugs.  Tobacco Cessation: Never smoker, but second Hand smoke as a child   Past surgical hx, Family hx, Social hx all reviewed.  Current Outpatient Medications on File Prior to Visit  Medication Sig   albuterol (VENTOLIN HFA) 108 (90 Base) MCG/ACT inhaler Inhale 2 puffs into the lungs every 6 (six) hours as needed for wheezing or shortness of breath.   cetirizine (ZYRTEC) 10 MG tablet Take 1 tablet by mouth daily.   clonazePAM (KLONOPIN) 0.5 MG tablet Take 0.5 mg by mouth 3 (three) times daily as needed for anxiety.   esomeprazole (NEXIUM) 20 MG capsule Take 20 mg by mouth daily at 12 noon.   ferrous sulfate 325 (65 FE) MG tablet Take 325 mg by mouth daily with breakfast.   fluticasone (FLONASE) 50 MCG/ACT nasal spray Place into both nostrils daily.   gabapentin (NEURONTIN) 300 MG capsule Take 300 mg by mouth 3 (three) times daily.   levonorgestrel (MIRENA, 52 MG,) 20 MCG/DAY IUD Mirena 21 mcg/24 hours (8 yrs) 52 mg  intrauterine device  Take 1 device by intrauterine route.   levothyroxine (SYNTHROID) 25 MCG tablet 1 tab daily p.o. on an empty stomach x6 days a week, 2 tabs p.o. 1 day a week.   lithium carbonate (LITHOBID) 300 MG CR tablet Take 600 mg by mouth daily.   Sertraline HCl 150 MG CAPS Take 100 mg by mouth daily.   SUMAtriptan (IMITREX) 100 MG tablet Take 1 tablet earliest onset of migraine.  May repeat in 2 hours. Maximum 2 tablets in 24 hours.   traZODone (DESYREL) 100 MG tablet Take 100 mg by mouth at bedtime.   No current facility-administered medications on file prior to visit.     Allergies  Allergen Reactions   Almond (Diagnostic) Anaphylaxis   Almond Oil Anaphylaxis and Other (See Comments)   Codeine Other (See Comments)    Makes patient aggressive     Hydrocodone Other (See Comments)    Makes patient aggressive     Review Of Systems:  Constitutional:   No  weight loss, night sweats,  Fevers, chills, fatigue, or  lassitude.  HEENT:   No headaches,  Difficulty swallowing,  Tooth/dental problems, or  Sore throat,                No sneezing, + itching, ear ache, +nasal congestion, post nasal drip,   CV:  No chest pain,  Orthopnea, PND, swelling in lower extremities, anasarca, dizziness, palpitations, syncope.   GI  No heartburn, indigestion, abdominal pain, nausea, vomiting, diarrhea, change in bowel habits, loss of appetite, bloody stools.   Resp: + shortness of breath with exertion none  at rest.  No excess mucus, no productive cough,  + non-productive cough,  No coughing up of blood.  No change in color of mucus.  + wheezing.  No chest wall deformity  Skin:  + hives to her stomach, back and upper legs  GU: no dysuria, change in color of urine, no urgency or frequency.  No flank pain, no hematuria   MS:  No joint pain or  swelling.  No decreased range of motion.  No back pain.  Psych:  No change in mood or affect. No depression or anxiety.  No memory loss.   Vital  Signs BP 122/76 (BP Location: Left Arm, Patient Position: Sitting, Cuff Size: Normal)   Pulse 75   Temp 98.3 F (36.8 C) (Oral)   Ht 5\' 4"  (1.626 m)   Wt 206 lb (93.4 kg)   SpO2 97%   BMI 35.36 kg/m    Physical Exam:  General- No distress,  A&Ox3, pleasant and appropriate ENT: No sinus tenderness, TM clear, pale nasal mucosa, no oral exudate,+ post nasal drip, no LAN, sinus congestion  Cardiac: S1, S2, regular rate and rhythm, no murmur Chest: No wheeze/ rales/ dullness; no accessory muscle use, no nasal flaring, no sternal retractions, non- productive cough Abd.: Soft Non-tender, ND, BS +, Body mass index is 35.36 kg/m.  Ext: No clubbing cyanosis, edema Neuro:  normal strength, MAE x 4, A&O x 3, appropriate Skin: No rashes, warm and dry, no lesions  Psych: normal mood and behavior   Assessment/Plan ill-defined 8 x 9 mm posterior left lower lobe nodule with some slow growth over the last 9 months since previous imaging( Now10 x 9 mm,) Plan I have ordered a PET scan to better evaluate your lung nodule as there has been some slight growth. You will get a call to get this scheduled. Follow up after PET scan with Maralyn Sago NP or Dr. Tonia Brooms. Please contact office for sooner follow up if symptoms do not improve or worsen or seek emergency care    Asthma with allergic reaction to [facility] and hoarse voice ? 2/2 powder  Plan Stop Breo Wait a few days to ensure hives are clearing, then start [facility] , 2 puffs twice daily Rinse mouth after use  I will give you a spacer to use. To prevent direct contact with the back of your throat in case this is contributing to hoarseness. Albuterol as needed for shortness of breath or wheezing. Stop drug if you re-develop hives and let us know Call me if you would like a prednisone taper. Add pepcid 20 mg daily to your Zyrtec for the itching as needed  Please contact office for sooner follow up if symptoms do not improve or worsen or  seek emergency care    I spent 35 minutes dedicated to the care of this patient on the date of this encounter to include pre-visit review of records, face-to-face time with the patient discussing conditions above, post visit ordering of testing, clinical documentation with the electronic health record, making appropriate referrals as documented, and communicating necessary information to the patient's healthcare team.   [name], NP 11/05/2022  2:53 PM

## 2022-11-05 NOTE — Patient Instructions (Addendum)
It is good to see you today. We will change your Breo to [facility] 2 puffs twice daily. Rinse mouth after use. Stop [facility]  I will give you a spacer to use. Albuterol as needed for shortness of breath or wheezing. I have ordered a PET scan to better evaluate your lung nodule as there has been some slight growth. You will get a call to get this scheduled. Continue Zyrtec and consider adding pepcid 20 mg daily for itching.  Follow up after PET scan with Maralyn Sago NP or Dr. Tonia Brooms. Please contact office for sooner follow up if symptoms do not improve or worsen or seek emergency care

## 2022-11-08 DIAGNOSIS — Z4889 Encounter for other specified surgical aftercare: Secondary | ICD-10-CM | POA: Diagnosis not present

## 2022-11-08 DIAGNOSIS — M961 Postlaminectomy syndrome, not elsewhere classified: Secondary | ICD-10-CM | POA: Diagnosis not present

## 2022-11-11 DIAGNOSIS — F411 Generalized anxiety disorder: Secondary | ICD-10-CM | POA: Diagnosis not present

## 2022-11-11 DIAGNOSIS — F3111 Bipolar disorder, current episode manic without psychotic features, mild: Secondary | ICD-10-CM | POA: Diagnosis not present

## 2022-11-14 ENCOUNTER — Ambulatory Visit (HOSPITAL_COMMUNITY): Payer: Self-pay | Admitting: Orthopedic Surgery

## 2022-11-14 DIAGNOSIS — F3132 Bipolar disorder, current episode depressed, moderate: Secondary | ICD-10-CM | POA: Diagnosis not present

## 2022-11-15 DIAGNOSIS — E039 Hypothyroidism, unspecified: Secondary | ICD-10-CM | POA: Diagnosis not present

## 2022-11-15 DIAGNOSIS — E538 Deficiency of other specified B group vitamins: Secondary | ICD-10-CM | POA: Diagnosis not present

## 2022-11-15 DIAGNOSIS — Z5181 Encounter for therapeutic drug level monitoring: Secondary | ICD-10-CM | POA: Diagnosis not present

## 2022-11-15 DIAGNOSIS — F3111 Bipolar disorder, current episode manic without psychotic features, mild: Secondary | ICD-10-CM | POA: Diagnosis not present

## 2022-11-15 DIAGNOSIS — E611 Iron deficiency: Secondary | ICD-10-CM | POA: Diagnosis not present

## 2022-11-15 DIAGNOSIS — F411 Generalized anxiety disorder: Secondary | ICD-10-CM | POA: Diagnosis not present

## 2022-11-15 DIAGNOSIS — E559 Vitamin D deficiency, unspecified: Secondary | ICD-10-CM | POA: Diagnosis not present

## 2022-11-19 DIAGNOSIS — J45901 Unspecified asthma with (acute) exacerbation: Secondary | ICD-10-CM | POA: Diagnosis not present

## 2022-11-19 DIAGNOSIS — R051 Acute cough: Secondary | ICD-10-CM | POA: Diagnosis not present

## 2022-11-19 DIAGNOSIS — J029 Acute pharyngitis, unspecified: Secondary | ICD-10-CM | POA: Diagnosis not present

## 2022-11-20 ENCOUNTER — Encounter (HOSPITAL_COMMUNITY)
Admission: RE | Admit: 2022-11-20 | Discharge: 2022-11-20 | Disposition: A | Discharge status: Home / Self Care | Payer: BC Managed Care – PPO | Source: Ambulatory Visit | Attending: Acute Care | Admitting: Acute Care

## 2022-11-20 DIAGNOSIS — R911 Solitary pulmonary nodule: Secondary | ICD-10-CM | POA: Diagnosis not present

## 2022-11-20 LAB — GLUCOSE, CAPILLARY: Glucose-Capillary: 104 mg/dL — ABNORMAL HIGH (ref 70–99)

## 2022-11-20 MED ORDER — FLUDEOXYGLUCOSE F - 18 (FDG) INJECTION
10.0000 | Freq: Once | INTRAVENOUS | Status: AC
  Administered 2022-11-20: 10.2 via INTRAVENOUS

## 2022-11-26 ENCOUNTER — Ambulatory Visit (INDEPENDENT_AMBULATORY_CARE_PROVIDER_SITE_OTHER): Payer: BC Managed Care – PPO | Admitting: Acute Care

## 2022-11-26 ENCOUNTER — Encounter: Payer: Self-pay | Admitting: Acute Care

## 2022-11-26 VITALS — BP 118/74 | HR 70 | Temp 98.4°F | Ht 63.0 in | Wt 207.0 lb

## 2022-11-26 DIAGNOSIS — Z7722 Contact with and (suspected) exposure to environmental tobacco smoke (acute) (chronic): Secondary | ICD-10-CM | POA: Diagnosis not present

## 2022-11-26 DIAGNOSIS — R911 Solitary pulmonary nodule: Secondary | ICD-10-CM | POA: Diagnosis not present

## 2022-11-26 DIAGNOSIS — Z789 Other specified health status: Secondary | ICD-10-CM | POA: Diagnosis not present

## 2022-11-26 DIAGNOSIS — L509 Urticaria, unspecified: Secondary | ICD-10-CM

## 2022-11-26 DIAGNOSIS — R0600 Dyspnea, unspecified: Secondary | ICD-10-CM

## 2022-11-26 MED ORDER — EPINEPHRINE 0.3 MG/0.3ML IJ SOAJ
0.3000 mg | Freq: Once | INTRAMUSCULAR | 0 refills | Status: AC
Start: 2022-11-26 — End: 2022-11-26

## 2022-11-26 NOTE — Patient Instructions (Addendum)
It is good to see you today. Your PET scan does show that the nodule of concern is mildly hypermetabolic, and does raise a small concern for lung cancer. Plan will be for a bronchoscopy and possible one time anesthesia combo case  if the biopsy does result as cancer.  You will get a call to get PFT's scheduled.  Once PFT's have been scheduled , we will send a consult to be seen by Dr. Cliffton Asters . After both of the above occur, we will get you scheduled for the procedure.  As you do not like Dulera, and you have hives despite changing from [place], if you prefer to use the [facility], that is fine. Stop use if any airway swelling, tingling, and seek emergency care. Ambulatory referral to Allergy for hives I will prescribe an Epi Pen for emergency use . Inject into muscle if needed for life threatening allergic reaction  and call 911 right away. Good luck with your back surgery 12/12/2022. We will plan this after you have recovered from you back surgery. Call if you need [place] for anything

## 2022-11-26 NOTE — Progress Notes (Signed)
History of Present Illness Sheila Mathews is a 47 year old female, incidentally found left lower lobe pulmonary nodule measuring 9 mm in size this was back in October 2023. Patient also has some shortness of breath and occasional chest tightness., ? Asthma. She has significant secondhand smoke exposure related to family. She was referred to see Dr. Tonia Brooms 08/2022   11/26/2022 Pt. Presents for follow up after PET scan. 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 Plan will be for a possible combo case with Dr. Tonia Brooms and Dr. Cliffton Asters.  Patient will need PFT's first to ensure she is a surgical candidate. She is in agreement with this plan. She is very anxious and wants to know what this nodule is. She has surgery scheduled for  12/12/2022 for back stimulator insertion. She will need 4 weeks to recover  . We will refer to surgery for evaluation, and get PFT's to ensure she is a surgical candidate.  I have no reason to believe she will not be a surgical candidate as she is a never smoker. Plan will be for bronchoscopy and possible combination case , singular anesthetic event surgery after she recovers from her back stimulator insertion. Patient is in agreement with the above plan.  Patient was given Dulera at last visit as she had developed hives around the time of initiating Breo for her asthma.  She states she does not like the [facility] as much as she liked the [facility] and she is still having hives so she no longer feels it is related to her inhaler.  Patient states she would prefer to restart the [facility].  I have told her this is fine, however have referred her to allergy for evaluation.  Additionally I have prescribed her an EpiPen to keep with her at all times.  Test Results: PET scan 11/20/2022 Indeterminate hypermetabolism but raising concern for small primary bronchogenic  carcinoma.  10/24/2022 CT Chest Left lower lobe pulmonary nodule measures 10 x 9 mm, previously 9 x 8 mm on chest CT and 11 x 9 mm on abdominal CT. Margins are microlobulated. While morphology is suspicious for neoplasm, stability over the course of 9 months is reassuring. Recommend either 6 month chest CT follow-up or PET characterization. 2. An area of subpleural ground-glass in the dependent left lower lobe is slightly smaller than on prior exams. This may represent an area of scarring.     Latest Ref Rng & Units 05/13/2022   12:52 PM 04/08/2022   12:06 PM 01/28/2022    4:33 AM  CBC  WBC 4.0 - 10.5 K/uL 9.7  7.2  13.0   Hemoglobin 12.0 - 15.0 g/dL 40.9  81.1  91.4   Hematocrit 36.0 - 46.0 % 40.2  36.5  38.9   Platelets 150 - 400 K/uL 407  356.0  418        Latest Ref Rng & Units 05/13/2022   12:52 PM 04/08/2022   12:06 PM 01/28/2022    4:33 AM  BMP  Glucose 70 - 99 mg/dL 93  96  782   BUN 6 - 20 mg/dL 12  9  10    Creatinine 0.44 - 1.00 mg/dL 9.56  2.13  0.86   Sodium 135 - 145 mmol/L 137  138  137   Potassium 3.5 - 5.1 mmol/L 4.1  4.5  3.7   Chloride 98 - 111 mmol/L 104  105  105  CO2 22 - 32 mmol/L 21  25  22    Calcium 8.9 - 10.3 mg/dL 16.1  9.5  9.5     BNP No results found for: "BNP"  ProBNP No results found for: "PROBNP"  PFT No results found for: "FEV1PRE", "FEV1POST", "FVCPRE", "FVCPOST", "TLC", "DLCOUNC", "PREFEV1FVCRT", "PSTFEV1FVCRT"  NM PET Image Initial (PI) Skull Base To Thigh  Result Date: 11/26/2022 CLINICAL DATA:  Initial treatment strategy for left lower lobe pulmonary nodule. EXAM: NUCLEAR MEDICINE PET SKULL BASE TO THIGH TECHNIQUE: 10.2 mCi F-18 FDG was injected intravenously. Full-ring PET imaging was performed from the skull base to thigh after the radiotracer. CT data was obtained and used for attenuation correction and anatomic localization. Fasting blood glucose: 104 mg/dl COMPARISON:  CT chest dated 10/24/2022 FINDINGS: Mediastinal blood pool  activity: SUV max 2.7 Liver activity: SUV max NA NECK: No hypermetabolic thoracic lymphadenopathy. Incidental CT findings: None. CHEST: 10 x 9 mm irregular nodule at the left lung base (series 7/image 87), max SUV 2.3. This appearance is indeterminate but raises concern for small primary bronchogenic carcinoma. No suspicious mediastinal lymphadenopathy. 9 mm short axis left axillary node with preservation of the normal fatty hilum (series 4/image 87), max SUV 1.8, favored to be reactive. Incidental CT findings: None. ABDOMEN/PELVIS: No abnormal hypermetabolism in the liver, spleen, pancreas, or adrenal glands. No hypermetabolic abdominopelvic lymphadenopathy. Incidental CT findings: IUD in satisfactory position. SKELETON: No focal hypermetabolic activity to suggest skeletal metastasis. Incidental CT findings: Postsurgical changes involving the lower lumbar spine and left sacroiliac joint. IMPRESSION: 10 mm irregular nodule at the left lung base demonstrates mild hypermetabolism, indeterminate but raising concern for small primary bronchogenic carcinoma. No findings suspicious for metastatic disease. Electronically Signed   By: Charline Bills M.D.   On: 11/26/2022 02:25     Past medical hx Past Medical History:  Diagnosis Date   Abnormal cervical Papanicolaou smear 04/20/2020   Allergies    Anxiety    Bipolar disorder (HCC)    Chicken pox    Chronic sinusitis 07/26/2016   Complication of anesthesia    pt doesn't like mask she has panic attack; childhood   Depression    Family history of adverse reaction to anesthesia    Mother PONV   GERD (gastroesophageal reflux disease)    History of anemia    during pregnancy   Migraines    Nonsmoker    Pelvic pain in female 05/13/2014   Pneumonia    PONV (postoperative nausea and vomiting)    no issues with short procdures, had one isntance of vomitus following a longer procedure    Sciatica    Thyroid disease      Social History   Tobacco Use    Smoking status: Never   Smokeless tobacco: Never  Vaping Use   Vaping Use: Never used  Substance Use Topics   Alcohol use: Yes    Comment: social   Drug use: No    Ms.Bos reports that she has never smoked. She has never used smokeless tobacco. She reports current alcohol use. She reports that she does not use drugs.  Tobacco Cessation: Never Smoker   Past surgical hx, Family hx, Social hx all reviewed.  Current Outpatient Medications on File Prior to Visit  Medication Sig   albuterol (VENTOLIN HFA) 108 (90 Base) MCG/ACT inhaler Inhale 2 puffs into the lungs every 6 (six) hours as needed for wheezing or shortness of breath.   cetirizine (ZYRTEC) 10 MG tablet Take 1 tablet by mouth  daily.   clonazePAM (KLONOPIN) 0.5 MG tablet Take 0.5 mg by mouth 3 (three) times daily as needed for anxiety.   esomeprazole (NEXIUM) 20 MG capsule Take 20 mg by mouth daily at 12 noon.   ferrous sulfate 325 (65 FE) MG tablet Take 325 mg by mouth daily with breakfast.   fluticasone (FLONASE) 50 MCG/ACT nasal spray Place into both nostrils daily.   gabapentin (NEURONTIN) 300 MG capsule Take 300 mg by mouth 3 (three) times daily.   levonorgestrel (MIRENA, 52 MG,) 20 MCG/DAY IUD Mirena 21 mcg/24 hours (8 yrs) 52 mg intrauterine device  Take 1 device by intrauterine route.   levothyroxine (SYNTHROID) 25 MCG tablet 1 tab daily p.o. on an empty stomach x6 days a week, 2 tabs p.o. 1 day a week.   lithium 600 MG capsule Take 1 capsule every day by oral route.   mometasone-formoterol (DULERA) 100-5 MCG/ACT AERO Inhale 2 puffs into the lungs in the morning and at bedtime.   Sertraline HCl 150 MG CAPS Take 100 mg by mouth daily.   SUMAtriptan (IMITREX) 100 MG tablet Take 1 tablet earliest onset of migraine.  May repeat in 2 hours. Maximum 2 tablets in 24 hours.   traZODone (DESYREL) 100 MG tablet Take 100 mg by mouth at bedtime.   lithium carbonate (LITHOBID) 300 MG CR tablet Take 600 mg by mouth daily.  (Patient not taking: Reported on 11/26/2022)   No current facility-administered medications on file prior to visit.     Allergies  Allergen Reactions   Almond (Diagnostic) Anaphylaxis   Almond Oil Anaphylaxis and Other (See Comments)   Codeine Other (See Comments)    Makes patient aggressive     Hydrocodone Other (See Comments)    Makes patient aggressive    Fluticasone Furoate-Vilanterol Hives    Review Of Systems:  Constitutional:   No  weight loss, night sweats,  Fevers, chills, fatigue, or  lassitude.  HEENT:   No headaches,  Difficulty swallowing,  Tooth/dental problems, or  Sore throat,                No sneezing, itching, ear ache, nasal congestion, post nasal drip,   CV:  No chest pain,  Orthopnea, PND, swelling in lower extremities, anasarca, dizziness, palpitations, syncope.   GI  No heartburn, indigestion, abdominal pain, nausea, vomiting, diarrhea, change in bowel habits, loss of appetite, bloody stools.   Resp: No shortness of breath with exertion or at rest.  No excess mucus, no productive cough,  No non-productive cough,  No coughing up of blood.  No change in color of mucus.  No wheezing.  No chest wall deformity  Skin: Positive hives to ankles legs  GU: no dysuria, change in color of urine, no urgency or frequency.  No flank pain, no hematuria   MS:  No joint pain or swelling.  No decreased range of motion.  + Chronic back pain.  Psych:  No change in mood or affect. No depression or anxiety.  No memory loss.   Vital Signs BP 118/74 (BP Location: Left Arm, Patient Position: Sitting, Cuff Size: Normal)   Pulse 70   Temp 98.4 F (36.9 C) (Oral)   Ht 5\' 3"  (1.6 m)   Wt 207 lb (93.9 kg)   SpO2 96%   BMI 36.67 kg/m    Physical Exam:  General- No distress,  A&Ox3, pleasant ENT: No sinus tenderness, TM clear, pale nasal mucosa, no oral exudate,no post nasal drip, no LAN Cardiac: S1,  S2, regular rate and rhythm, no murmur Chest: No wheeze/ rales/  dullness; no accessory muscle use, no nasal flaring, no sternal retractions Abd.: Soft Non-tender, nondistended, bowel sounds positive,Body mass index is 36.67 kg/m.  Ext: No clubbing cyanosis, edema Neuro:  normal strength, moving all extremities x 4, alert and oriented x 3 Skin: No rashes, warm and dry, hives as noted earlier Psych: normal mood and behavior   Assessment/Plan 9 mm left lower lobe pulmonary nodule and never smoker Heavy secondhand smoke exposure as a child Mild hypermetabolic activity on PET scan raising concern for lung cancer Plan Your PET scan does show that the nodule of concern is mildly hypermetabolic, and does raise a small concern for lung cancer. Plan will be for a bronchoscopy and possible one time anesthesia combo case  if the biopsy does result as cancer.  You will get a call to get PFT's scheduled.  Once PFT's have been scheduled , we will send a consult to be seen by Dr. Cliffton Asters . After both of the above occur, we will get you scheduled for the procedure.  As you do not like Dulera, and you have hives despite changing from [place], if you prefer to use the [facility], that is fine. Stop use if any airway swelling, tingling, and seek emergency care. Ambulatory referral to Allergy for hives I will prescribe an Epi Pen for emergency use . Inject into muscle if needed for life threatening allergic reaction  and call 911 right away. Good luck with your back surgery 12/12/2022. We will plan this after you have recovered from you back surgery. Call if you need [place] for anything  Shortness of breath/chest tightness Improved on Breo however patient developed hives and suspected related to inhaler Tried on [facility] however patient does not feel same benefit and still has hives Plan As you do not like Dulera, and you have hives despite changing from [place], if you prefer to use the [place], that is fine.  Stop use if any airway swelling, tingling, and seek emergency  care. Ambulatory referral to Allergy for hives I will prescribe an Epi Pen for emergency use . Inject into muscle if needed for life threatening allergic reaction  and call 911 right away. Good luck with your back surgery 12/12/2022. We will plan this after you have recovered from you back surgery. Call if you need [place] for anything  I spent 35 minutes dedicated to the care of this patient on the date of this encounter to include pre-visit review of records, face-to-face time with the patient discussing conditions above, post visit ordering of testing, clinical documentation with the electronic health record, making appropriate referrals as documented, and communicating necessary information to the patient's healthcare team.   [name], NP 11/26/2022  6:01 PM

## 2022-11-28 DIAGNOSIS — F3132 Bipolar disorder, current episode depressed, moderate: Secondary | ICD-10-CM | POA: Diagnosis not present

## 2022-11-29 ENCOUNTER — Telehealth: Payer: Self-pay | Admitting: Pharmacy Technician

## 2022-11-29 ENCOUNTER — Other Ambulatory Visit (HOSPITAL_COMMUNITY): Payer: Self-pay

## 2022-11-29 NOTE — Telephone Encounter (Unsigned)
Patient Advocate Encounter  Received notification from [facility] that prior authorization for [facility] 120MG  (LOADING) is required.   PA submitted on 5.31.24 Key [id number] Status is pending

## 2022-12-02 ENCOUNTER — Ambulatory Visit (HOSPITAL_COMMUNITY)
Admission: RE | Admit: 2022-12-02 | Discharge: 2022-12-02 | Disposition: A | Discharge status: Home / Self Care | Payer: BC Managed Care – PPO | Source: Ambulatory Visit | Attending: Acute Care | Admitting: Acute Care

## 2022-12-02 ENCOUNTER — Other Ambulatory Visit (HOSPITAL_COMMUNITY): Payer: Self-pay

## 2022-12-02 DIAGNOSIS — R911 Solitary pulmonary nodule: Secondary | ICD-10-CM | POA: Insufficient documentation

## 2022-12-02 LAB — PULMONARY FUNCTION TEST
DL/VA % pred: 82 %
DL/VA: 3.65 ml/min/mmHg/L
DLCO cor % pred: 88 %
DLCO cor: 18.37 ml/min/mmHg
DLCO unc % pred: 86 %
DLCO unc: 17.96 ml/min/mmHg
FEF 25-75 Post: 3.14 L/sec
FEF 25-75 Pre: 2.71 L/sec
FEF2575-%Change-Post: 15 %
FEF2575-%Pred-Post: 107 %
FEF2575-%Pred-Pre: 92 %
FEV1-%Change-Post: 4 %
FEV1-%Pred-Post: 112 %
FEV1-%Pred-Pre: 107 %
FEV1-Post: 3.21 L
FEV1-Pre: 3.06 L
FEV1FVC-%Change-Post: 3 %
FEV1FVC-%Pred-Pre: 94 %
FEV6-%Change-Post: 1 %
FEV6-%Pred-Post: 115 %
FEV6-%Pred-Pre: 114 %
FEV6-Post: 3.99 L
FEV6-Pre: 3.94 L
FEV6FVC-%Change-Post: 0 %
FEV6FVC-%Pred-Post: 102 %
FEV6FVC-%Pred-Pre: 102 %
FVC-%Change-Post: 1 %
FVC-%Pred-Post: 113 %
FVC-%Pred-Pre: 112 %
FVC-Post: 4 L
FVC-Pre: 3.95 L
Post FEV1/FVC ratio: 80 %
Post FEV6/FVC ratio: 100 %
Pre FEV1/FVC ratio: 77 %
Pre FEV6/FVC Ratio: 100 %
RV % pred: 101 %
RV: 1.64 L
TLC % pred: 116 %
TLC: 5.7 L

## 2022-12-02 MED ORDER — ALBUTEROL SULFATE (2.5 MG/3ML) 0.083% IN NEBU
2.5000 mg | INHALATION_SOLUTION | Freq: Once | RESPIRATORY_TRACT | Status: AC
  Administered 2022-12-02: 2.5 mg via RESPIRATORY_TRACT

## 2022-12-03 NOTE — Progress Notes (Signed)
     Your surgery and Pre-Admission testing visit will be at [facility] located at [address], [place], [place] [address].  Please let all your doctors (i.e., Primary Care Physician, Cardiologist, Endocrinologist, Pulmonologist) know you are having surgery. You may need clearance for surgery. If you are on blood thinners, notify your surgeon and ask the doctor who prescribed them how long to hold them before surgery.  If you have had a heart test, such as an EKG, stress test, heart ultrasound, etc., or lab work performed outside of [place], please bring copies of these tests to your Pre-Admission testing, if possible.  These departments may contact you before the day of surgery:  Pre-Service Center - insurance/ billing: [phone] Pharmacy- to review your medications: [phone] Pre-Admission Testing- to set an appointment for your visit: [phone]  (Often, these numbers show up as "SPAM" on your phone)  The Pre-Admission Testing (PAT) visit focuses on Anesthesia for your upcoming surgery.  You do NOT need to fast; take your medications as usual. Please arrive 30 minutes early to allow for parking and admitting.  The visit may last up to an hour. Bring a photo ID and medical insurance card. Reschedule if you are sick. ([phone]) and please, NO children under age 16 at the visit.  During the PAT visit:  We will review your medical and surgical history.   You will receive pre-operative instructions, including the time of arrival at the hospital and surgical start time.  We will review what medication(s) you can take on the day of surgery.  After speaking with the nurse, you will have blood drawn and, if needed, a chest x-ray and EKG.  Most lab results from your doctor are good for 30 days, Hemoglobin A1C is good for 60 days. If you cannot talk to the Pharmacy, bring your medications or a list of them to the PST visit.   Infection control for the [facility] requires: All fingernail and toenail products should be removed before the day of surgery.  (SNS, Acrylic, Gel, Polish, Stickers, Press on, and Poly gel nails.)   Parking information:  Address: [facility] - [address], [place], [place] [address]  Please look for signs for entrance A off of [address]. Free valet parking is available Monday-Friday 05:30am-06:00pm     

## 2022-12-04 ENCOUNTER — Encounter (HOSPITAL_COMMUNITY): Payer: Self-pay

## 2022-12-04 NOTE — Progress Notes (Signed)
Chest x-ray - 07/08/22 CE EKG - 05/16/22 Stress Test - n/a ECHO - n/a Cardiac Cath - n/a  ICD Pacemaker/Loop - n/a  Sleep Study -  n/a CPAP - none  Diabetes Type - n/a  ERAS: Clear liquids til 4:30 DOS.  Please complete your PRE-SURGERY ENSURE that was provided to you by 4:30 AM the morning of surgery.  Please, if able, drink it in one setting. DO NOT SIP.  CBC on 5/217/24.  STOP now taking any Aspirin (unless otherwise instructed by your surgeon), Aleve, Naproxen, Ibuprofen, Motrin, Advil, Goody's, BC's, all herbal medications, fish oil, and all vitamins.   Coronavirus Screening Do you have any of the following symptoms:  Cough occasional  Fever (>100.55F)  yes/no: No Runny nose yes/no: No Sore throat yes/no: No Difficulty breathing/shortness of breath  yes/no: No  Have you traveled in the last 14 days and where? [place] and [facility].  Patient verbalized understanding of instructions that were given to them at the PAT appointment. Patient was also instructed that they will need to review over the PAT instructions again at home before surgery.

## 2022-12-04 NOTE — Telephone Encounter (Signed)
Patient Advocate Encounter  Prior Authorization for [employer] 120MG /ML auto-injectors (migraine) has been approved through [employer].    Key: [id number]  Effective: 11-29-2022 to 02-21-2023

## 2022-12-04 NOTE — Pre-Procedure Instructions (Signed)
Surgical Instructions    Your procedure is scheduled on December 12, 2022.  Report to [facility] Main Entrance "A" at 5:30 A.M., then check in with the Admitting office.  Call this number if you have problems the morning of surgery:  [phone]  If you have any questions prior to your surgery date call [phone]: Open Monday-Friday 8am-4pm If you experience any cold or flu symptoms such as cough, fever, chills, shortness of breath, etc. between now and your scheduled surgery, please notify us at the above number.     Remember:  Do not eat after midnight the night before your surgery  You may drink clear liquids until 4:30 AM the morning of your surgery.   Clear liquids allowed are: Water, Non-Citrus Juices (without pulp), Carbonated Beverages, Clear Tea, Black Coffee Only (NO MILK, CREAM OR POWDERED CREAMER of any kind), and Gatorade.     Take these medicines the morning of surgery with A SIP OF WATER:  BREO ELLIPTA   cetirizine (ZYRTEC)   fluticasone (FLONASE) nasal spray   gabapentin (NEURONTIN)   levothyroxine (SYNTHROID)   sertraline (ZOLOFT)    May take these medicines IF NEEDED:  albuterol (VENTOLIN HFA) inhaler   clonazePAM (KLONOPIN)   naphazoline-pheniramine (NAPHCON-A) eye drops  SUMAtriptan (IMITREX)    As of today, STOP taking any Aspirin (unless otherwise instructed by your surgeon) Aleve, Naproxen, Ibuprofen, Motrin, Advil, Goody's, BC's, all herbal medications, fish oil, and all vitamins.                     Do NOT Smoke (Tobacco/Vaping) for 24 hours prior to your procedure.  If you use a CPAP at night, you may bring your mask/headgear for your overnight stay.   Contacts, glasses, piercing's, hearing aid's, dentures or partials may not be worn into surgery, please bring cases for these belongings.    For patients admitted to the hospital, discharge time will be determined by your treatment team.   Patients discharged the day of surgery will not be allowed  to drive home, and someone needs to stay with them for 24 hours.  SURGICAL WAITING ROOM VISITATION Patients having surgery or a procedure may have no more than 2 support people in the waiting area - these visitors may rotate.   Children under the age of 58 must have an adult with them who is not the patient. If the patient needs to stay at the hospital during part of their recovery, the visitor guidelines for inpatient rooms apply. Pre-op nurse will coordinate an appropriate time for 1 support person to accompany patient in pre-op.  This support person may not rotate.   Please refer to the [facility] website for the visitor guidelines for Inpatients (after your surgery is over and you are in a regular room).   If you received a COVID test during your pre-op visit  it is requested that you wear a mask when out in public, stay away from anyone that may not be feeling well and notify your surgeon if you develop symptoms. If you have been in contact with anyone that has tested positive in the last 10 days please notify you surgeon.    Pre-operative 5 CHG Bath Instructions   You can play a key role in reducing the risk of infection after surgery. Your skin needs to be as free of germs as possible. You can reduce the number of germs on your skin by washing with CHG (chlorhexidine gluconate) soap before surgery. CHG is  an antiseptic soap that kills germs and continues to kill germs even after washing.   DO NOT use if you have an allergy to chlorhexidine/CHG or antibacterial soaps. If your skin becomes reddened or irritated, stop using the CHG and notify one of our RNs at [phone].   Please shower with the CHG soap starting 4 days before surgery using the following schedule:     Please keep in mind the following:  DO NOT shave, including legs and underarms, starting the day of your first shower.   You may shave your face at any point before/day of surgery.  Place clean sheets on your bed the  day you start using CHG soap. Use a clean washcloth (not used since being washed) for each shower. DO NOT sleep with pets once you start using the CHG.   CHG Shower Instructions:  If you choose to wash your hair and private area, wash first with your normal shampoo/soap.  After you use shampoo/soap, rinse your hair and body thoroughly to remove shampoo/soap residue.  Turn the water OFF and apply about 3 tablespoons (45 ml) of CHG soap to a CLEAN washcloth.  Apply CHG soap ONLY FROM YOUR NECK DOWN TO YOUR TOES (washing for 3-5 minutes)  DO NOT use CHG soap on face, private areas, open wounds, or sores.  Pay special attention to the area where your surgery is being performed.  If you are having back surgery, having someone wash your back for you may be helpful. Wait 2 minutes after CHG soap is applied, then you may rinse off the CHG soap.  Pat dry with a clean towel  Put on clean clothes/pajamas   If you choose to wear lotion, please use ONLY the CHG-compatible lotions on the back of this paper.     Additional instructions for the day of surgery: DO NOT APPLY any lotions, deodorants, cologne, or perfumes.   Do not wear jewelry or makeup Do not wear nail polish, gel polish, artificial nails, or any other type of covering on natural nails (fingers and toes) Do not bring valuables to the hospital. [facility] is not responsible for any belongings or valuables. Put on clean/comfortable clothes.  Brush your teeth.  Ask your nurse before applying any prescription medications to the skin.      CHG Compatible Lotions   Aveeno Moisturizing lotion  Cetaphil Moisturizing Cream  Cetaphil Moisturizing Lotion  Clairol Herbal Essence Moisturizing Lotion, Dry Skin  Clairol Herbal Essence Moisturizing Lotion, Extra Dry Skin  Clairol Herbal Essence Moisturizing Lotion, Normal Skin  Curel Age Defying Therapeutic Moisturizing Lotion with Alpha Hydroxy  Curel Extreme Care Body Lotion  Curel Soothing  Hands Moisturizing Hand Lotion  Curel Therapeutic Moisturizing Cream, Fragrance-Free  Curel Therapeutic Moisturizing Lotion, Fragrance-Free  Curel Therapeutic Moisturizing Lotion, Original Formula  Eucerin Daily Replenishing Lotion  Eucerin Dry Skin Therapy Plus Alpha Hydroxy Crme  Eucerin Dry Skin Therapy Plus Alpha Hydroxy Lotion  Eucerin Original Crme  Eucerin Original Lotion  Eucerin Plus Crme Eucerin Plus Lotion  Eucerin TriLipid Replenishing Lotion  Keri Anti-Bacterial Hand Lotion  Keri Deep Conditioning Original Lotion Dry Skin Formula Softly Scented  Keri Deep Conditioning Original Lotion, Fragrance Free Sensitive Skin Formula  Keri Lotion Fast Absorbing Fragrance Free Sensitive Skin Formula  Keri Lotion Fast Absorbing Softly Scented Dry Skin Formula  Keri Original Lotion  Keri Skin Renewal Lotion Keri Silky Smooth Lotion  Keri Silky Smooth Sensitive Skin Lotion  Nivea Body Creamy Conditioning Oil  Nivea Body Extra  Enriched Teacher, adult education Moisturizing Lotion Nivea Crme  Nivea Skin Firming Lotion  NutraDerm 30 Skin Lotion  NutraDerm Skin Lotion  NutraDerm Therapeutic Skin Cream  NutraDerm Therapeutic Skin Lotion  ProShield Protective Hand Cream  Provon moisturizing lotion   Please read over the following fact sheets that you were given.

## 2022-12-05 ENCOUNTER — Encounter (HOSPITAL_COMMUNITY): Payer: Self-pay

## 2022-12-05 ENCOUNTER — Encounter (HOSPITAL_COMMUNITY)
Admission: RE | Admit: 2022-12-05 | Discharge: 2022-12-05 | Disposition: A | Discharge status: Home / Self Care | Payer: BC Managed Care – PPO | Source: Ambulatory Visit | Attending: Orthopedic Surgery | Admitting: Orthopedic Surgery

## 2022-12-05 ENCOUNTER — Other Ambulatory Visit: Payer: Self-pay

## 2022-12-05 VITALS — BP 96/65 | HR 64 | Temp 98.4°F | Resp 18 | Ht 63.0 in | Wt 209.0 lb

## 2022-12-05 DIAGNOSIS — Z01818 Encounter for other preprocedural examination: Secondary | ICD-10-CM

## 2022-12-05 DIAGNOSIS — F411 Generalized anxiety disorder: Secondary | ICD-10-CM | POA: Diagnosis not present

## 2022-12-05 DIAGNOSIS — Z01812 Encounter for preprocedural laboratory examination: Secondary | ICD-10-CM | POA: Insufficient documentation

## 2022-12-05 DIAGNOSIS — F3111 Bipolar disorder, current episode manic without psychotic features, mild: Secondary | ICD-10-CM | POA: Diagnosis not present

## 2022-12-05 HISTORY — DX: Hypothyroidism, unspecified: E03.9

## 2022-12-05 HISTORY — DX: Anemia, unspecified: D64.9

## 2022-12-05 HISTORY — DX: Dyspnea, unspecified: R06.00

## 2022-12-05 LAB — SURGICAL PCR SCREEN

## 2022-12-06 ENCOUNTER — Institutional Professional Consult (permissible substitution) (INDEPENDENT_AMBULATORY_CARE_PROVIDER_SITE_OTHER): Payer: BC Managed Care – PPO | Admitting: Thoracic Surgery (Cardiothoracic Vascular Surgery)

## 2022-12-06 ENCOUNTER — Encounter: Payer: Self-pay | Admitting: *Deleted

## 2022-12-06 ENCOUNTER — Other Ambulatory Visit: Payer: Self-pay | Admitting: *Deleted

## 2022-12-06 VITALS — BP 121/78 | HR 93 | Resp 20 | Ht 63.0 in | Wt 209.0 lb

## 2022-12-06 DIAGNOSIS — R911 Solitary pulmonary nodule: Secondary | ICD-10-CM | POA: Diagnosis not present

## 2022-12-06 NOTE — Progress Notes (Signed)
Surgical PCR screen invalid at PAT appointment.  Will repeat DOS.

## 2022-12-06 NOTE — H&P (View-Only) (Signed)
    [address]       [place] [address]             [phone]                    [facility] Medical Record #[id number] Date of Birth: 12/27/1975  Referring: Groce, Sarah F, NP Primary Care: James, Natalie W, PA-C Primary Cardiologist: None  Chief Complaint:    Chief Complaint  Patient presents with   Lung Lesion    PET 5/22, Chest CT 4/25, PFT's 6/3    History of Present Illness:    Sheila Mathews 46 y.o. female presents for surgical evaluation of a 1 cm left lower lobe pulmonary nodule that was found incidentally.  She is a lifelong non-smoker but was exposed to secondhand smoke most of her life until the age of 21.  She does have history of multiple back surgeries chronic back pain.  She was scheduled to undergo spinal stimulator implantation.    Smoking Hx: Lifelong non-smoker   Zubrod Score: At the time of surgery this patient's most appropriate activity status/level should be described as: []    0    Normal activity, no symptoms [x]    1    Restricted in physical strenuous activity but ambulatory, able to do out light work []    2    Ambulatory and capable of self care, unable to do work activities, up and about               >50 % of waking hours                              []    3    Only limited self care, in bed greater than 50% of waking hours []    4    Completely disabled, no self care, confined to bed or chair []    5    Moribund   Past Medical History:  Diagnosis Date   Abnormal cervical Papanicolaou smear 04/20/2020   Allergies    Anemia    with pregnancy , resolved after delivery   Anxiety    Bipolar disorder (HCC)    Chicken pox    Chronic sinusitis 07/26/2016   no current problems as of 12/05/22   Complication of anesthesia    pt doesn't like mask she has panic attack; childhood   Depression    Dyspnea    with intermittent chest tightness at times, has used daughter's albuterol inhaler which has helped per  patient 12/05/22   Family history of adverse reaction to anesthesia    Mother PONV   GERD (gastroesophageal reflux disease)    Hypothyroidism    Migraines    Nonsmoker    Pelvic pain in female 05/13/2014   resolved, no current problem as of 12/05/22   Pneumonia    x 1   PONV (postoperative nausea and vomiting)    no issues with short procdures, had one isntance of vomitus following a longer procedure    Sciatica    Thyroid disease     Past Surgical History:  Procedure Laterality Date   ABDOMINAL EXPOSURE N/A 09/21/2020   Procedure: LATERAL EXPOSURE FOR OBLIQUE LATERAL SPINE SURGERY;  Surgeon: Clark, Christopher J, MD;  Location: [facility];  Service: Vascular;  Laterality: N/A;   ANTERIOR LATERAL LUMBAR FUSION WITH PERCUTANEOUS SCREW 1   LEVEL N/A 09/21/2020   Procedure: OBLIQUE LUMBAR INTERBODY FUSION LUMBAR FOUR THROUGH FIVE, POSTERIOR SPINAL FUSION INSTRUMENTATION LUMAR FOUR THROUGH FIVE;  Surgeon: Brooks, Dahari, MD;  Location: [facility];  Service: Orthopedics;  Laterality: N/A;   CESAREAN SECTION  x2   2000, 2005   COLONOSCOPY     03/2022   LAPAROSCOPY N/A 05/13/2014   Procedure: LAPAROSCOPY DIAGNOSTIC;  Surgeon: Todd Meisinger, MD;  Location: [facility];  Service: Gynecology;  Laterality: N/A;   LUMBAR DISC SURGERY  2023   SI joint   LUMBAR LAMINECTOMY/DECOMPRESSION MICRODISCECTOMY Left 02/12/2017   Procedure: Microlumbar decompression L5-S1 left ;  Surgeon: Beane, Jeffrey, MD;  Location: [facility];  Service: Orthopedics;  Laterality: Left;  90 mins   SALIVARY GLAND SURGERY  1986   SPINAL CORD STIMULATOR REMOVAL  09/2022   UPPER GI ENDOSCOPY     WISDOM TOOTH EXTRACTION      Family History  Problem Relation Age of Onset   Depression Mother    Mental illness Mother    Diabetes Brother    Drug abuse Brother    Early death Brother    Mental illness Brother    Breast cancer Maternal Grandmother 30   Cancer Maternal Grandfather        head neck    Diabetes Maternal Grandfather    Lung  cancer Maternal Grandfather    Cancer Paternal Grandmother        gallbladder   Colon cancer Paternal Grandfather 80   Diabetes Paternal Grandfather    Breast cancer Maternal Aunt 65     Social History   Tobacco Use  Smoking Status Never  Smokeless Tobacco Never    Social History   Substance and Sexual Activity  Alcohol Use Yes   Comment: social     Allergies  Allergen Reactions   Almond (Diagnostic) Anaphylaxis   Almond Oil Anaphylaxis and Other (See Comments)   Codeine Other (See Comments)    Makes patient aggressive     Hydrocodone Other (See Comments)    Makes patient aggressive    Fluticasone Furoate-Vilanterol Hives    Pt is currently using this inhaler, pt is awaiting allergy testing for further clarification    Current Outpatient Medications  Medication Sig Dispense Refill   albuterol (VENTOLIN HFA) 108 (90 Base) MCG/ACT inhaler Inhale 2 puffs into the lungs every 6 (six) hours as needed for wheezing or shortness of breath. 8 g 6   BREO ELLIPTA 100-25 MCG/ACT AEPB Inhale 1 puff into the lungs daily.     cetirizine (ZYRTEC) 10 MG tablet Take 10 mg by mouth daily.     clonazePAM (KLONOPIN) 0.5 MG tablet Take 0.5 mg by mouth 3 (three) times daily as needed for anxiety.     EMGALITY 120 MG/ML SOAJ Inject 120 mg into the skin every 28 (twenty-eight) days. Last injection was on Sunday, 12/01/22     esomeprazole (NEXIUM) 20 MG capsule Take 20 mg by mouth daily at 12 noon.     ferrous sulfate 325 (65 FE) MG tablet Take 325 mg by mouth daily with breakfast.     fluticasone (FLONASE) 50 MCG/ACT nasal spray Place 2 sprays into both nostrils daily.     gabapentin (NEURONTIN) 300 MG capsule Take 300 mg by mouth 3 (three) times daily.     hydrocortisone cream 1 % Apply 1 Application topically as needed for itching.     lamoTRIgine (LAMICTAL) 25 MG tablet Take 50 mg by mouth at bedtime.       levonorgestrel (MIRENA, 52 MG,) 20 MCG/DAY IUD Mirena 21 mcg/24 hours (8 yrs) 52 mg  intrauterine device  Take 1 device by intrauterine route.     levothyroxine (SYNTHROID) 25 MCG tablet 1 tab daily p.o. on an empty stomach x6 days a week, 2 tabs p.o. 1 day a week. 102 tablet 3   lithium carbonate (LITHOBID) 300 MG ER tablet Take 900 mg by mouth every evening.     sertraline (ZOLOFT) 100 MG tablet Take 150 mg by mouth daily.     SUMAtriptan (IMITREX) 100 MG tablet Take 1 tablet earliest onset of migraine.  May repeat in 2 hours. Maximum 2 tablets in 24 hours. 10 tablet 5   traZODone (DESYREL) 100 MG tablet Take 100 mg by mouth at bedtime.     mometasone-formoterol (DULERA) 100-5 MCG/ACT AERO Inhale 2 puffs into the lungs in the morning and at bedtime. (Patient not taking: Reported on 12/03/2022) 1 each 3   naphazoline-pheniramine (NAPHCON-A) 0.025-0.3 % ophthalmic solution Place 1 drop into both eyes 4 (four) times daily as needed for allergies (dry eyes).     No current facility-administered medications for this visit.    Review of Systems  Constitutional:  Negative for malaise/fatigue.  Respiratory:  Negative for cough and shortness of breath.   Cardiovascular:  Negative for chest pain.  Musculoskeletal:  Positive for back pain and myalgias.  Neurological: Negative.      PHYSICAL EXAMINATION: BP 121/78   Pulse 93   Resp 20   Ht 5' 3" (1.6 m)   Wt 209 lb (94.8 kg)   SpO2 97% Comment: RA  BMI 37.02 kg/m  Physical Exam Constitutional:      General: She is not in acute distress.    Appearance: She is not ill-appearing.  HENT:     Head: Normocephalic and atraumatic.  Eyes:     Extraocular Movements: Extraocular movements intact.  Cardiovascular:     Rate and Rhythm: Normal rate.  Pulmonary:     Effort: Pulmonary effort is normal. No respiratory distress.  Abdominal:     General: There is no distension.  Musculoskeletal:        General: Normal range of motion.     Cervical back: Normal range of motion.  Skin:    General: Skin is warm and dry.   Neurological:     General: No focal deficit present.     Mental Status: She is alert and oriented to person, place, and time.     Diagnostic Studies & Laboratory data:     Recent Radiology Findings:   NM PET Image Initial (PI) Skull Base To Thigh  Result Date: 11/26/2022 CLINICAL DATA:  Initial treatment strategy for left lower lobe pulmonary nodule. EXAM: NUCLEAR MEDICINE PET SKULL BASE TO THIGH TECHNIQUE: 10.2 mCi F-18 FDG was injected intravenously. Full-ring PET imaging was performed from the skull base to thigh after the radiotracer. CT data was obtained and used for attenuation correction and anatomic localization. Fasting blood glucose: 104 mg/dl COMPARISON:  CT chest dated 10/24/2022 FINDINGS: Mediastinal blood pool activity: SUV max 2.7 Liver activity: SUV max NA NECK: No hypermetabolic thoracic lymphadenopathy. Incidental CT findings: None. CHEST: 10 x 9 mm irregular nodule at the left lung base (series 7/image 87), max SUV 2.3. This appearance is indeterminate but raises concern for small primary bronchogenic carcinoma. No suspicious mediastinal lymphadenopathy. 9 mm short axis left axillary node with preservation of the normal fatty hilum (series 4/image 87), max SUV 1.8, favored to be reactive.   Incidental CT findings: None. ABDOMEN/PELVIS: No abnormal hypermetabolism in the liver, spleen, pancreas, or adrenal glands. No hypermetabolic abdominopelvic lymphadenopathy. Incidental CT findings: IUD in satisfactory position. SKELETON: No focal hypermetabolic activity to suggest skeletal metastasis. Incidental CT findings: Postsurgical changes involving the lower lumbar spine and left sacroiliac joint. IMPRESSION: 10 mm irregular nodule at the left lung base demonstrates mild hypermetabolism, indeterminate but raising concern for small primary bronchogenic carcinoma. No findings suspicious for metastatic disease. Electronically Signed   By: Sriyesh  Krishnan M.D.   On: 11/26/2022 02:25       I  have independently reviewed the above radiology studies  and reviewed the findings with the patient.   Recent Lab Findings: Lab Results  Component Value Date   WBC 9.7 05/13/2022   HGB 12.6 05/13/2022   HCT 40.2 05/13/2022   PLT 407 (H) 05/13/2022   GLUCOSE 93 05/13/2022   CHOL 218 (H) 02/05/2021   TRIG 156.0 (H) 02/05/2021   HDL 47.00 02/05/2021   LDLCALC 140 (H) 02/05/2021   ALT 11 01/28/2022   AST 13 (L) 01/28/2022   NA 137 05/13/2022   K 4.1 05/13/2022   CL 104 05/13/2022   CREATININE 0.82 05/13/2022   BUN 12 05/13/2022   CO2 21 (L) 05/13/2022   TSH 2.68 03/13/2022   INR 1.0 09/19/2020   HGBA1C 5.4 02/05/2021     PFTs:  - FVC: 112% - FEV1: 107% -DLCO: 88%    Assessment / Plan:   46-year-old female with left lower lobe nodule.  It measures 10 mm on recent PET/CT with an SUV uptake of 2.3.  There is no increased hilar and mediastinal lymphadenopathy.  She is a lifelong non-smoker but has had exposure to secondhand smoke.  She is concerned that this may be a malignancy.  We discussed the risks and benefits of combination procedure with Dr. Icard which will include a navigational bronchoscopy with biopsy and marking followed by a left robotic assisted thoracoscopy with left lower lobe wedge resection and possible lobectomy.  Following recovery from this she will undergo spinal stimulator implantation.     I  spent 40 minutes with  the patient face to face in counseling and coordination of care.    Jaanvi Fizer O Tara Wich 12/06/2022 3:42 PM        

## 2022-12-06 NOTE — Progress Notes (Signed)
[address]       [place] [address]             [phone]                    Sheila Mathews [facility] Medical Record #[id number] Date of Birth: Aug 19, 1975  Referring: [name], NP Primary Care: Clemetine Marker Primary Cardiologist: None  Chief Complaint:    Chief Complaint  Patient presents with   Lung Lesion    PET 5/22, Chest CT 4/25, PFT's 6/3    History of Present Illness:    Sheila Mathews 47 y.o. female presents for surgical evaluation of a 1 cm left lower lobe pulmonary nodule that was found incidentally.  She is a lifelong non-smoker but was exposed to secondhand smoke most of her life until the age of 55.  She does have history of multiple back surgeries chronic back pain.  She was scheduled to undergo spinal stimulator implantation.    Smoking Hx: Lifelong non-smoker   Zubrod Score: At the time of surgery this patient's most appropriate activity status/level should be described as: []     0    Normal activity, no symptoms [x]     1    Restricted in physical strenuous activity but ambulatory, able to do out light work []     2    Ambulatory and capable of self care, unable to do work activities, up and about               >50 % of waking hours                              []     3    Only limited self care, in bed greater than 50% of waking hours []     4    Completely disabled, no self care, confined to bed or chair []     5    Moribund   Past Medical History:  Diagnosis Date   Abnormal cervical Papanicolaou smear 04/20/2020   Allergies    Anemia    with pregnancy , resolved after delivery   Anxiety    Bipolar disorder (HCC)    Chicken pox    Chronic sinusitis 07/26/2016   no current problems as of 12/05/22   Complication of anesthesia    pt doesn't like mask she has panic attack; childhood   Depression    Dyspnea    with intermittent chest tightness at times, has used daughter's albuterol inhaler which has helped per  patient 12/05/22   Family history of adverse reaction to anesthesia    Mother PONV   GERD (gastroesophageal reflux disease)    Hypothyroidism    Migraines    Nonsmoker    Pelvic pain in female 05/13/2014   resolved, no current problem as of 12/05/22   Pneumonia    x 1   PONV (postoperative nausea and vomiting)    no issues with short procdures, had one isntance of vomitus following a longer procedure    Sciatica    Thyroid disease     Past Surgical History:  Procedure Laterality Date   ABDOMINAL EXPOSURE N/A 09/21/2020   Procedure: LATERAL EXPOSURE FOR OBLIQUE LATERAL SPINE SURGERY;  Surgeon: Cephus Shelling, MD;  Location: [facility];  Service: Vascular;  Laterality: N/A;   ANTERIOR LATERAL LUMBAR FUSION WITH PERCUTANEOUS SCREW 1  LEVEL N/A 09/21/2020   Procedure: OBLIQUE LUMBAR INTERBODY FUSION LUMBAR FOUR THROUGH FIVE, POSTERIOR SPINAL FUSION INSTRUMENTATION LUMAR FOUR THROUGH FIVE;  Surgeon: Venita Lick, MD;  Location: [facility];  Service: Orthopedics;  Laterality: N/A;   CESAREAN SECTION  x2   2000, 2005   COLONOSCOPY     03/2022   LAPAROSCOPY N/A 05/13/2014   Procedure: LAPAROSCOPY DIAGNOSTIC;  Surgeon: Lavina Hamman, MD;  Location: [facility];  Service: Gynecology;  Laterality: N/A;   LUMBAR DISC SURGERY  2023   SI joint   LUMBAR LAMINECTOMY/DECOMPRESSION MICRODISCECTOMY Left 02/12/2017   Procedure: Microlumbar decompression L5-S1 left ;  Surgeon: Jene Every, MD;  Location: [facility];  Service: Orthopedics;  Laterality: Left;  90 mins   SALIVARY GLAND SURGERY  1986   SPINAL CORD STIMULATOR REMOVAL  09/2022   UPPER GI ENDOSCOPY     WISDOM TOOTH EXTRACTION      Family History  Problem Relation Age of Onset   Depression Mother    Mental illness Mother    Diabetes Brother    Drug abuse Brother    Early death Brother    Mental illness Brother    Breast cancer Maternal Grandmother 30   Cancer Maternal Grandfather        head neck    Diabetes Maternal Grandfather    Lung  cancer Maternal Grandfather    Cancer Paternal Grandmother        gallbladder   Colon cancer Paternal Grandfather 25   Diabetes Paternal Grandfather    Breast cancer Maternal Aunt 45     Social History   Tobacco Use  Smoking Status Never  Smokeless Tobacco Never    Social History   Substance and Sexual Activity  Alcohol Use Yes   Comment: social     Allergies  Allergen Reactions   Almond (Diagnostic) Anaphylaxis   Almond Oil Anaphylaxis and Other (See Comments)   Codeine Other (See Comments)    Makes patient aggressive     Hydrocodone Other (See Comments)    Makes patient aggressive    Fluticasone Furoate-Vilanterol Hives    Pt is currently using this inhaler, pt is awaiting allergy testing for further clarification    Current Outpatient Medications  Medication Sig Dispense Refill   albuterol (VENTOLIN HFA) 108 (90 Base) MCG/ACT inhaler Inhale 2 puffs into the lungs every 6 (six) hours as needed for wheezing or shortness of breath. 8 g 6   BREO ELLIPTA 100-25 MCG/ACT AEPB Inhale 1 puff into the lungs daily.     cetirizine (ZYRTEC) 10 MG tablet Take 10 mg by mouth daily.     clonazePAM (KLONOPIN) 0.5 MG tablet Take 0.5 mg by mouth 3 (three) times daily as needed for anxiety.     EMGALITY 120 MG/ML SOAJ Inject 120 mg into the skin every 28 (twenty-eight) days. Last injection was on Sunday, 12/01/22     esomeprazole (NEXIUM) 20 MG capsule Take 20 mg by mouth daily at 12 noon.     ferrous sulfate 325 (65 FE) MG tablet Take 325 mg by mouth daily with breakfast.     fluticasone (FLONASE) 50 MCG/ACT nasal spray Place 2 sprays into both nostrils daily.     gabapentin (NEURONTIN) 300 MG capsule Take 300 mg by mouth 3 (three) times daily.     hydrocortisone cream 1 % Apply 1 Application topically as needed for itching.     lamoTRIgine (LAMICTAL) 25 MG tablet Take 50 mg by mouth at bedtime.  levonorgestrel (MIRENA, 52 MG,) 20 MCG/DAY IUD Mirena 21 mcg/24 hours (8 yrs) 52 mg  intrauterine device  Take 1 device by intrauterine route.     levothyroxine (SYNTHROID) 25 MCG tablet 1 tab daily p.o. on an empty stomach x6 days a week, 2 tabs p.o. 1 day a week. 102 tablet 3   lithium carbonate (LITHOBID) 300 MG ER tablet Take 900 mg by mouth every evening.     sertraline (ZOLOFT) 100 MG tablet Take 150 mg by mouth daily.     SUMAtriptan (IMITREX) 100 MG tablet Take 1 tablet earliest onset of migraine.  May repeat in 2 hours. Maximum 2 tablets in 24 hours. 10 tablet 5   traZODone (DESYREL) 100 MG tablet Take 100 mg by mouth at bedtime.     mometasone-formoterol (DULERA) 100-5 MCG/ACT AERO Inhale 2 puffs into the lungs in the morning and at bedtime. (Patient not taking: Reported on 12/03/2022) 1 each 3   naphazoline-pheniramine (NAPHCON-A) 0.025-0.3 % ophthalmic solution Place 1 drop into both eyes 4 (four) times daily as needed for allergies (dry eyes).     No current facility-administered medications for this visit.    Review of Systems  Constitutional:  Negative for malaise/fatigue.  Respiratory:  Negative for cough and shortness of breath.   Cardiovascular:  Negative for chest pain.  Musculoskeletal:  Positive for back pain and myalgias.  Neurological: Negative.      PHYSICAL EXAMINATION: BP 121/78   Pulse 93   Resp 20   Ht 5\' 3"  (1.6 m)   Wt 209 lb (94.8 kg)   SpO2 97% Comment: RA  BMI 37.02 kg/m  Physical Exam Constitutional:      General: She is not in acute distress.    Appearance: She is not ill-appearing.  HENT:     Head: Normocephalic and atraumatic.  Eyes:     Extraocular Movements: Extraocular movements intact.  Cardiovascular:     Rate and Rhythm: Normal rate.  Pulmonary:     Effort: Pulmonary effort is normal. No respiratory distress.  Abdominal:     General: There is no distension.  Musculoskeletal:        General: Normal range of motion.     Cervical back: Normal range of motion.  Skin:    General: Skin is warm and dry.   Neurological:     General: No focal deficit present.     Mental Status: She is alert and oriented to person, place, and time.     Diagnostic Studies & Laboratory data:     Recent Radiology Findings:   NM PET Image Initial (PI) Skull Base To Thigh  Result Date: 11/26/2022 CLINICAL DATA:  Initial treatment strategy for left lower lobe pulmonary nodule. EXAM: NUCLEAR MEDICINE PET SKULL BASE TO THIGH TECHNIQUE: 10.2 mCi F-18 FDG was injected intravenously. Full-ring PET imaging was performed from the skull base to thigh after the radiotracer. CT data was obtained and used for attenuation correction and anatomic localization. Fasting blood glucose: 104 mg/dl COMPARISON:  CT chest dated 10/24/2022 FINDINGS: Mediastinal blood pool activity: SUV max 2.7 Liver activity: SUV max NA NECK: No hypermetabolic thoracic lymphadenopathy. Incidental CT findings: None. CHEST: 10 x 9 mm irregular nodule at the left lung base (series 7/image 87), max SUV 2.3. This appearance is indeterminate but raises concern for small primary bronchogenic carcinoma. No suspicious mediastinal lymphadenopathy. 9 mm short axis left axillary node with preservation of the normal fatty hilum (series 4/image 87), max SUV 1.8, favored to be reactive.  Incidental CT findings: None. ABDOMEN/PELVIS: No abnormal hypermetabolism in the liver, spleen, pancreas, or adrenal glands. No hypermetabolic abdominopelvic lymphadenopathy. Incidental CT findings: IUD in satisfactory position. SKELETON: No focal hypermetabolic activity to suggest skeletal metastasis. Incidental CT findings: Postsurgical changes involving the lower lumbar spine and left sacroiliac joint. IMPRESSION: 10 mm irregular nodule at the left lung base demonstrates mild hypermetabolism, indeterminate but raising concern for small primary bronchogenic carcinoma. No findings suspicious for metastatic disease. Electronically Signed   By: Charline Bills M.D.   On: 11/26/2022 02:25       I  have independently reviewed the above radiology studies  and reviewed the findings with the patient.   Recent Lab Findings: Lab Results  Component Value Date   WBC 9.7 05/13/2022   HGB 12.6 05/13/2022   HCT 40.2 05/13/2022   PLT 407 (H) 05/13/2022   GLUCOSE 93 05/13/2022   CHOL 218 (H) 02/05/2021   TRIG 156.0 (H) 02/05/2021   HDL 47.00 02/05/2021   LDLCALC 140 (H) 02/05/2021   ALT 11 01/28/2022   AST 13 (L) 01/28/2022   NA 137 05/13/2022   K 4.1 05/13/2022   CL 104 05/13/2022   CREATININE 0.82 05/13/2022   BUN 12 05/13/2022   CO2 21 (L) 05/13/2022   TSH 2.68 03/13/2022   INR 1.0 09/19/2020   HGBA1C 5.4 02/05/2021     PFTs:  - FVC: 112% - FEV1: 107% -DLCO: 88%    Assessment / Plan:   47 year old female with left lower lobe nodule.  It measures 10 mm on recent PET/CT with an SUV uptake of 2.3.  There is no increased hilar and mediastinal lymphadenopathy.  She is a lifelong non-smoker but has had exposure to secondhand smoke.  She is concerned that this may be a malignancy.  We discussed the risks and benefits of combination procedure with Dr. Tonia Brooms which will include a navigational bronchoscopy with biopsy and marking followed by a left robotic assisted thoracoscopy with left lower lobe wedge resection and possible lobectomy.  Following recovery from this she will undergo spinal stimulator implantation.     I  spent 40 minutes with  the patient face to face in counseling and coordination of care.    Corliss Skains 12/06/2022 3:42 PM

## 2022-12-10 ENCOUNTER — Telehealth: Payer: Self-pay

## 2022-12-10 NOTE — Telephone Encounter (Signed)
FMLA/STD form completed and mailed to pt's home address Beginning LOA 12/30/22 through 03/03/23./ DOS 12/30/22

## 2022-12-12 ENCOUNTER — Ambulatory Visit (HOSPITAL_COMMUNITY)
Admission: RE | Admit: 2022-12-12 | Payer: BC Managed Care – PPO | Source: Home / Self Care | Admitting: Orthopedic Surgery

## 2022-12-12 ENCOUNTER — Encounter (HOSPITAL_COMMUNITY): Admission: RE | Payer: Self-pay | Source: Home / Self Care

## 2022-12-12 DIAGNOSIS — Z01818 Encounter for other preprocedural examination: Secondary | ICD-10-CM

## 2022-12-12 SURGERY — INSERTION, SPINAL CORD STIMULATOR, LUMBAR
Anesthesia: General

## 2022-12-15 IMAGING — CT CT BIOPSY
4 of 7 series · 12 of 32 positions shown, 17 images · non-contrast
Comparison: none

CLINICAL DATA: Chronic left SI joint pain. Positive response to an
anesthetic only SI joint injection on 12/28/2020.

[Series 2: needle -guided injection · axial · 0.80mm/px · z∈[-146,-98]mm · 6 of 34 slices shown, 11 images (1 of 4)]
[im 5/34  soft-tissue]
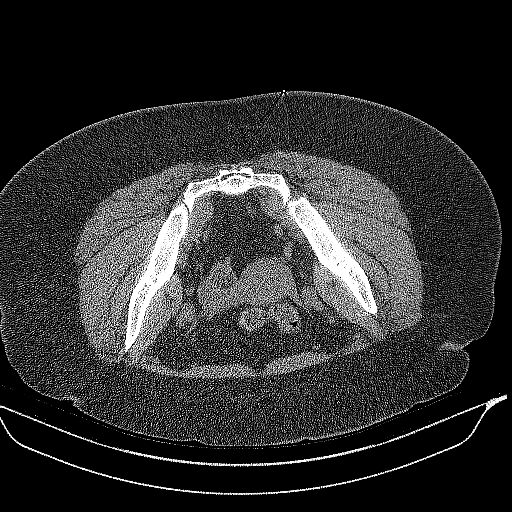
[im 5/34  bone]
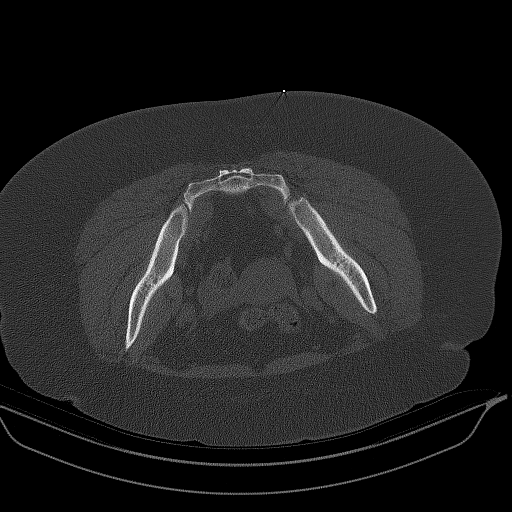
[im 10/34  soft-tissue]
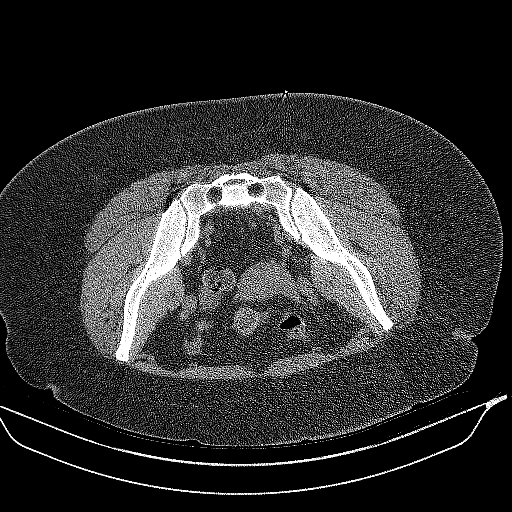
[im 15/34  soft-tissue]
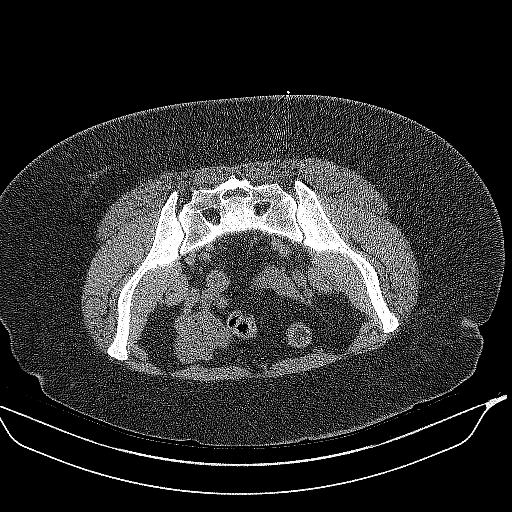
[im 15/34  lung]
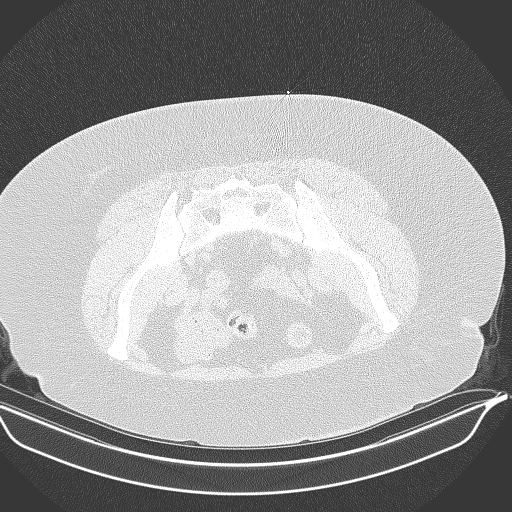
[im 19/34  soft-tissue]
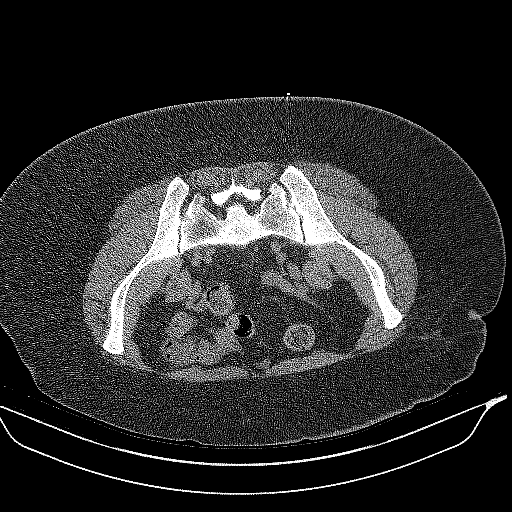
[im 19/34  lung]
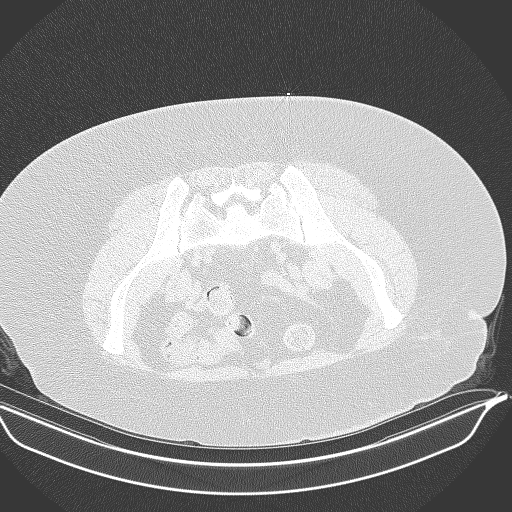
[im 24/34  soft-tissue]
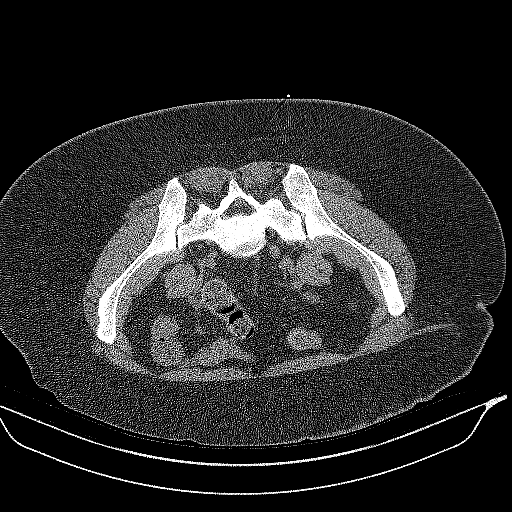
[im 24/34  lung]
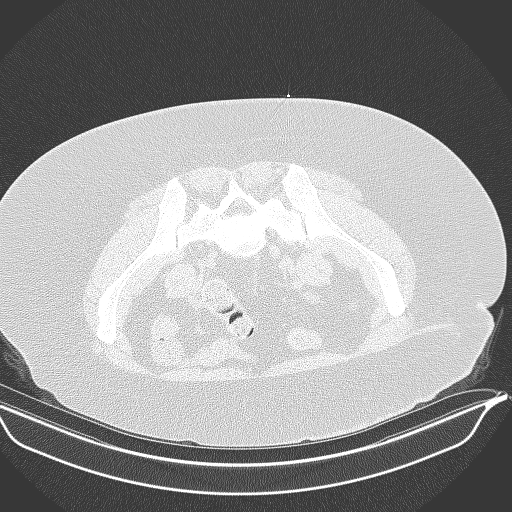
[im 29/34  soft-tissue]
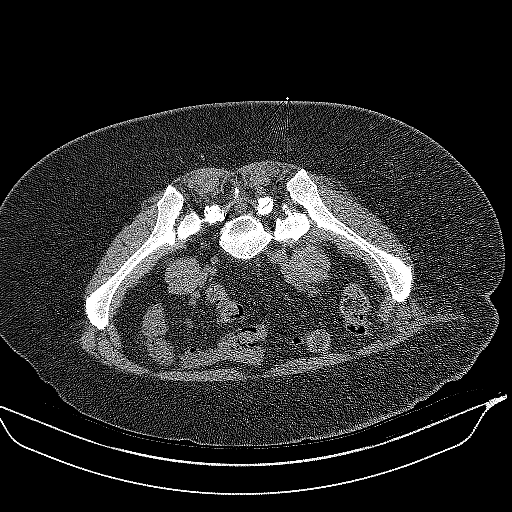
[im 29/34  lung]
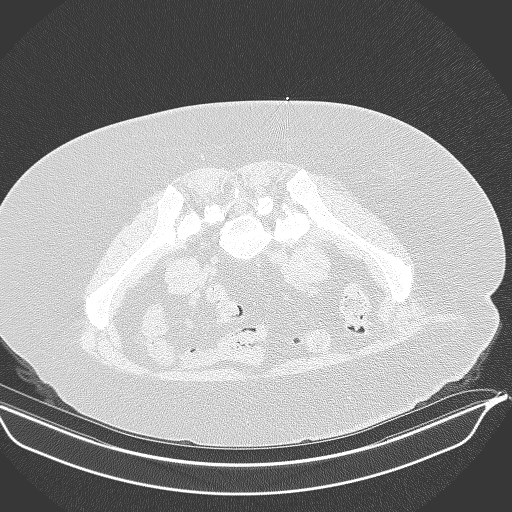

[Series 3: needle -guided injection · axial · 0.80mm/px · z∈[-137,-125]mm · 2 of 20 slices shown (2 of 4)]
[im 7/20  soft-tissue]
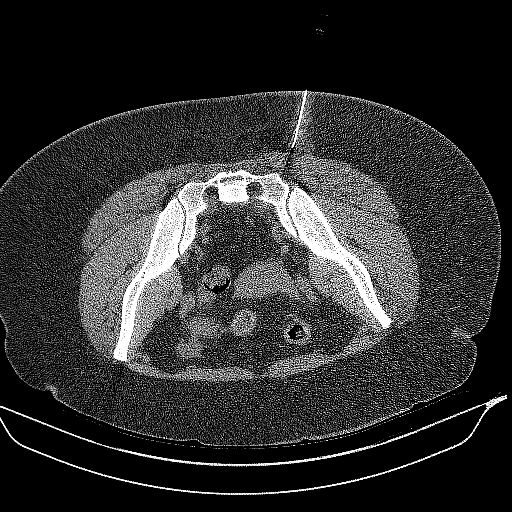
[im 13/20  soft-tissue]
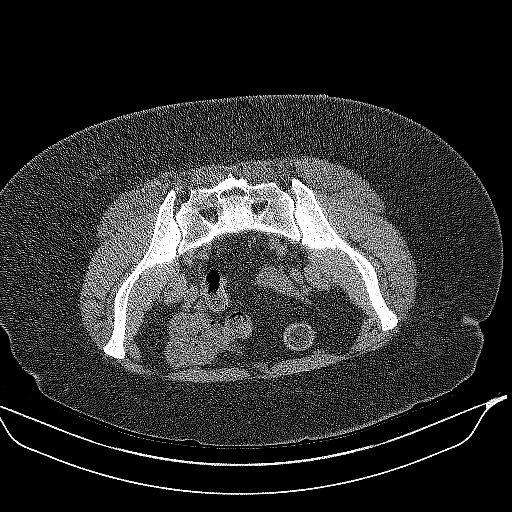

[Series 4: needle -guided injection · axial · 0.80mm/px · z∈[-145,-133]mm · 2 of 18 slices shown (3 of 4)]
[im 6/18  soft-tissue]
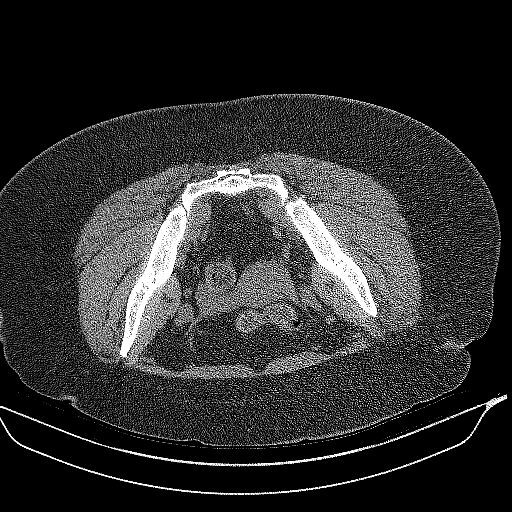
[im 12/18  soft-tissue]
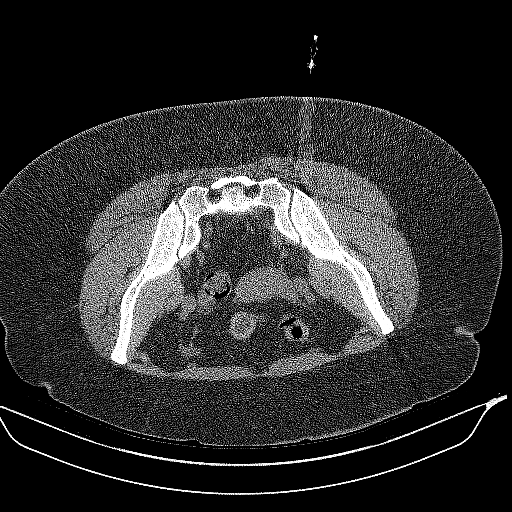

[Series 5: needle -guided injection · axial · 0.80mm/px · z∈[-145,-133]mm · 2 of 18 slices shown (4 of 4)]
[im 6/18  soft-tissue]
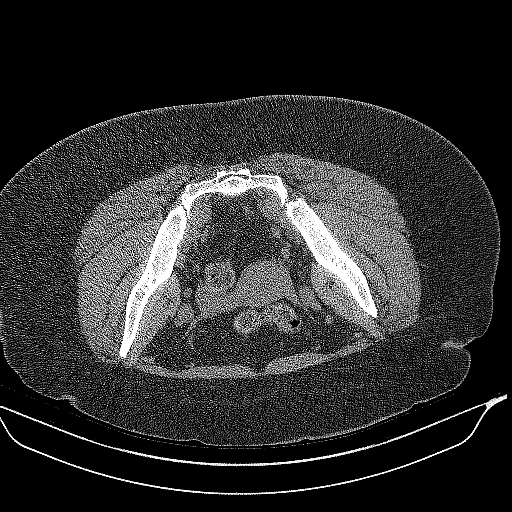
[im 12/18  soft-tissue]
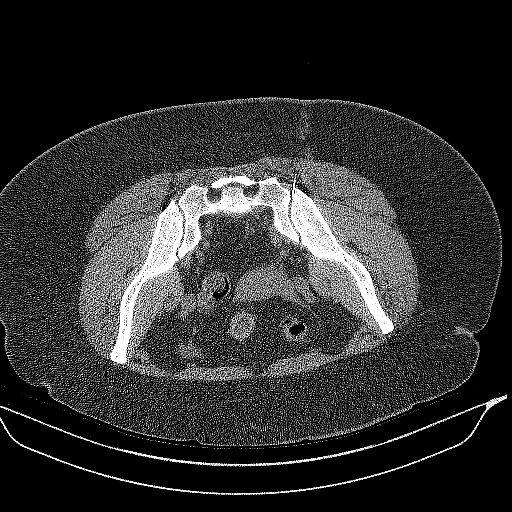

[12 of 32 positions shown; findings below may reference images not displayed]

EXAM:
CT GUIDED LEFT SI JOINT INJECTION

PROCEDURE:
After a thorough discussion of risks and benefits of the procedure,
including bleeding, infection, injury to nerves, blood vessels, and
adjacent structures, verbal and written consent was obtained. The
patient was placed prone on the CT table and localization was
performed over the sacrum. Target site marked using CT guidance. The
skin was prepped and draped in the usual sterile fashion using
Betadine soap.

After local anesthesia with 1% lidocaine without epinephrine and
subsequent deep anesthesia, a 3.5 inch 22 gauge spinal needle was
advanced into the left SI joint under intermittent CT guidance.

Injection of a small amount of Isovue-M 200 demonstrated
intra-articular contrast spread. Subsequently, 80 mg of Depo-Medrol
mixed with 1 mL 0.5% bupivacaine were injected into the left SI
joint. The needle was removed and a sterile dressing applied.

No complications were observed.
IMPRESSION: Successful CT-guided left SI joint injection.

## 2022-12-25 ENCOUNTER — Other Ambulatory Visit (HOSPITAL_COMMUNITY): Payer: Self-pay | Admitting: *Deleted

## 2022-12-25 ENCOUNTER — Telehealth: Payer: Self-pay | Admitting: Pulmonary Disease

## 2022-12-25 ENCOUNTER — Telehealth: Payer: Self-pay | Admitting: Neurology

## 2022-12-25 MED ORDER — EMGALITY 120 MG/ML ~~LOC~~ SOAJ: 120.0000 mg | SUBCUTANEOUS | 5 refills | Status: DC

## 2022-12-25 NOTE — Telephone Encounter (Signed)
PT is having  pre-procedure instructions at [facility] tomorrow and they need Informed Consent form sent in.  Any questions call  Edgardo Roys [phone]

## 2022-12-25 NOTE — Telephone Encounter (Signed)
Refill sent.

## 2022-12-25 NOTE — Telephone Encounter (Signed)
Pt is calling in stating that the pharmacy is stating that the provider has not authorized the Rx Emgality and she should have gotten it two days ago and would like to know what she should do.  Pt would like to have a call back when it has been called in.

## 2022-12-25 NOTE — Telephone Encounter (Signed)
Called and spoke with Greta.  Edgardo Roys stated informed consent normally placed in chart by Dr. Tonia Brooms is needed.  Dr. Tonia Brooms is out of the office and due to return at the hospital 12/27/22.  Greta stated Dr. Tonia Brooms can place order the day of procedure. Nothing further needed at this time.

## 2022-12-25 NOTE — Pre-Procedure Instructions (Signed)
Surgical Instructions    Your procedure is scheduled on Monday, July 1.   Report to [facility] Main Entrance "A" at 0530 A.M., then check in with the Admitting office.  Call this number if you have problems the morning of surgery:  [phone]  If you have any questions prior to your surgery date call [phone]: Open Monday-Friday 8am-4pm If you experience any cold or flu symptoms such as cough, fever, chills, shortness of breath, etc. between now and your scheduled surgery, please notify us at the above number.     Remember:  Do not eat after midnight the night before your surgery   Take these medicines the morning of surgery with A SIP OF WATER               BREO ELLIPTA              cetirizine (ZYRTEC)              fluticasone (FLONASE) nasal spray              gabapentin (NEURONTIN)              levothyroxine (SYNTHROID)              sertraline (ZOLOFT)                May take these medicines IF NEEDED:             albuterol (VENTOLIN HFA) inhaler              clonazePAM (KLONOPIN)              naphazoline-pheniramine (NAPHCON-A) eye drops             SUMAtriptan (IMITREX)   As of today, STOP taking any Aspirin (unless otherwise instructed by your surgeon) Aleve, Naproxen, Ibuprofen, Motrin, Advil, Goody's, BC's, all herbal medications, fish oil, and all vitamins.                     Do NOT Smoke (Tobacco/Vaping) for 24 hours prior to your procedure.  If you use a CPAP at night, you may bring your mask/headgear for your overnight stay.   Contacts, glasses, piercing's, hearing aid's, dentures or partials may not be worn into surgery, please bring cases for these belongings.    For patients admitted to the hospital, discharge time will be determined by your treatment team.   Patients discharged the day of surgery will not be allowed to drive home, and someone needs to stay with them for 24 hours.  SURGICAL WAITING ROOM VISITATION Patients having surgery or a  procedure may have no more than 2 support people in the waiting area - these visitors may rotate.   Children under the age of 64 must have an adult with them who is not the patient. If the patient needs to stay at the hospital during part of their recovery, the visitor guidelines for inpatient rooms apply. Pre-op nurse will coordinate an appropriate time for 1 support person to accompany patient in pre-op.  This support person may not rotate.   Please refer to the [facility] website for the visitor guidelines for Inpatients (after your surgery is over and you are in a regular room).    Special instructions:   [place]- Preparing For Surgery  Before surgery, you can play an important role. Because skin is not sterile, your skin needs to be as free of germs as possible. You can reduce the  number of germs on your skin by washing with CHG (chlorahexidine gluconate) Soap before surgery.  CHG is an antiseptic cleaner which kills germs and bonds with the skin to continue killing germs even after washing.    Oral Hygiene is also important to reduce your risk of infection.  Remember - BRUSH YOUR TEETH THE MORNING OF SURGERY WITH YOUR REGULAR TOOTHPASTE  Please do not use if you have an allergy to CHG or antibacterial soaps. If your skin becomes reddened/irritated stop using the CHG.  Do not shave (including legs and underarms) for at least 48 hours prior to first CHG shower. It is OK to shave your face.  Please follow these instructions carefully.   Shower the NIGHT BEFORE SURGERY and the MORNING OF SURGERY  If you chose to wash your hair, wash your hair first as usual with your normal shampoo.  After you shampoo, rinse your hair and body thoroughly to remove the shampoo.  Use CHG Soap as you would any other liquid soap. You can apply CHG directly to the skin and wash gently with a scrungie or a clean washcloth.   Apply the CHG Soap to your body ONLY FROM THE NECK DOWN.  Do not use on open  wounds or open sores. Avoid contact with your eyes, ears, mouth and genitals (private parts). Wash Face and genitals (private parts)  with your normal soap.   Wash thoroughly, paying special attention to the area where your surgery will be performed.  Thoroughly rinse your body with warm water from the neck down.  DO NOT shower/wash with your normal soap after using and rinsing off the CHG Soap.  Pat yourself dry with a CLEAN TOWEL.  Wear CLEAN PAJAMAS to bed the night before surgery  Place CLEAN SHEETS on your bed the night before your surgery  DO NOT SLEEP WITH PETS.   Day of Surgery: Take a shower with CHG soap. Do not wear jewelry or makeup Do not wear lotions, powders, perfumes/colognes, or deodorant. Do not shave 48 hours prior to surgery.  Men may shave face and neck. Do not bring valuables to the hospital.  [facility] is not responsible for any belongings or valuables. Do not wear nail polish, gel polish, artificial nails, or any other type of covering on natural nails (fingers and toes) If you have artificial nails or gel coating that need to be removed by a nail salon, please have this removed prior to surgery. Artificial nails or gel coating may interfere with anesthesia's ability to adequately monitor your vital signs. Wear Clean/Comfortable clothing the morning of surgery Remember to brush your teeth WITH YOUR REGULAR TOOTHPASTE.   Please read over the following fact sheets that you were given.    If you received a COVID test during your pre-op visit  it is requested that you wear a mask when out in public, stay away from anyone that may not be feeling well and notify your surgeon if you develop symptoms. If you have been in contact with anyone that has tested positive in the last 10 days please notify you surgeon.

## 2022-12-26 ENCOUNTER — Ambulatory Visit (HOSPITAL_COMMUNITY)
Admission: RE | Admit: 2022-12-26 | Discharge: 2022-12-26 | Disposition: A | Discharge status: Home / Self Care | Payer: BC Managed Care – PPO | Source: Ambulatory Visit | Attending: Thoracic Surgery (Cardiothoracic Vascular Surgery) | Admitting: Thoracic Surgery (Cardiothoracic Vascular Surgery)

## 2022-12-26 ENCOUNTER — Other Ambulatory Visit: Payer: Self-pay

## 2022-12-26 ENCOUNTER — Encounter (HOSPITAL_COMMUNITY)
Admission: RE | Admit: 2022-12-26 | Discharge: 2022-12-26 | Disposition: A | Discharge status: Home / Self Care | Payer: BC Managed Care – PPO | Source: Ambulatory Visit | Attending: Pulmonary Disease | Admitting: Pulmonary Disease

## 2022-12-26 ENCOUNTER — Encounter (HOSPITAL_COMMUNITY): Payer: Self-pay

## 2022-12-26 VITALS — BP 106/72 | HR 74 | Temp 98.2°F | Resp 18 | Ht 63.0 in | Wt 205.3 lb

## 2022-12-26 DIAGNOSIS — R9431 Abnormal electrocardiogram [ECG] [EKG]: Secondary | ICD-10-CM | POA: Diagnosis not present

## 2022-12-26 DIAGNOSIS — Z7951 Long term (current) use of inhaled steroids: Secondary | ICD-10-CM | POA: Insufficient documentation

## 2022-12-26 DIAGNOSIS — Z01818 Encounter for other preprocedural examination: Secondary | ICD-10-CM | POA: Diagnosis not present

## 2022-12-26 DIAGNOSIS — Z01811 Encounter for preprocedural respiratory examination: Secondary | ICD-10-CM | POA: Diagnosis not present

## 2022-12-26 DIAGNOSIS — R911 Solitary pulmonary nodule: Secondary | ICD-10-CM | POA: Diagnosis not present

## 2022-12-26 DIAGNOSIS — Z793 Long term (current) use of hormonal contraceptives: Secondary | ICD-10-CM | POA: Diagnosis not present

## 2022-12-26 LAB — SURGICAL PCR SCREEN
MRSA, PCR: NEGATIVE
Staphylococcus aureus: NEGATIVE

## 2022-12-26 LAB — CBC
HCT: 40.7 % (ref 36.0–46.0)
Hemoglobin: 12.6 g/dL (ref 12.0–15.0)
MCH: 27.6 pg (ref 26.0–34.0)
MCHC: 31 g/dL (ref 30.0–36.0)
MCV: 89.3 fL (ref 80.0–100.0)
Platelets: 349 10*3/uL (ref 150–400)
RBC: 4.56 MIL/uL (ref 3.87–5.11)
RDW: 15.6 % — ABNORMAL HIGH (ref 11.5–15.5)
WBC: 11.6 10*3/uL — ABNORMAL HIGH (ref 4.0–10.5)
nRBC: 0 % (ref 0.0–0.2)

## 2022-12-26 LAB — URINALYSIS, ROUTINE W REFLEX MICROSCOPIC
Bilirubin Urine: NEGATIVE
Glucose, UA: NEGATIVE mg/dL
Hgb urine dipstick: NEGATIVE
Ketones, ur: NEGATIVE mg/dL
Leukocytes,Ua: NEGATIVE
Nitrite: NEGATIVE
Protein, ur: NEGATIVE mg/dL
Specific Gravity, Urine: 1.005 (ref 1.005–1.030)
pH: 6 (ref 5.0–8.0)

## 2022-12-26 LAB — COMPREHENSIVE METABOLIC PANEL
ALT: 14 U/L (ref 0–44)
AST: 16 U/L (ref 15–41)
Albumin: 4.1 g/dL (ref 3.5–5.0)
Alkaline Phosphatase: 65 U/L (ref 38–126)
Anion gap: 13 (ref 5–15)
BUN: 9 mg/dL (ref 6–20)
CO2: 23 mmol/L (ref 22–32)
Calcium: 9.3 mg/dL (ref 8.9–10.3)
Chloride: 101 mmol/L (ref 98–111)
Creatinine, Ser: 0.73 mg/dL (ref 0.44–1.00)
GFR, Estimated: >60 mL/min (ref >60)
Glucose, Bld: 101 mg/dL — ABNORMAL HIGH (ref 70–99)
Potassium: 4 mmol/L (ref 3.5–5.1)
Sodium: 137 mmol/L (ref 135–145)
Total Bilirubin: 0.7 mg/dL (ref 0.3–1.2)
Total Protein: 7.4 g/dL (ref 6.5–8.1)

## 2022-12-26 LAB — PROTIME-INR
INR: 1.1 (ref 0.8–1.2)
Prothrombin Time: 14.3 seconds (ref 11.4–15.2)

## 2022-12-26 LAB — TYPE AND SCREEN
ABO/RH(D): A POS
Antibody Screen: NEGATIVE

## 2022-12-26 LAB — APTT: aPTT: 28 seconds (ref 24–36)

## 2022-12-26 NOTE — Pre-Procedure Instructions (Signed)
Surgical Instructions    Your procedure is scheduled on Monday, July 1st, 2024.   Report to [facility] Main Entrance "A" at 0530 A.M., then check in with the Admitting office.  Call this number if you have problems the morning of surgery:  [phone]  If you have any questions prior to your surgery date call [phone]: Open Monday-Friday 8am-4pm If you experience any cold or flu symptoms such as cough, fever, chills, shortness of breath, etc. between now and your scheduled surgery, please notify us at the above number.     Remember:  Do not eat after midnight the night before your surgery   Take these medicines the morning of surgery with A SIP OF WATER               BREO ELLIPTA              cetirizine (ZYRTEC)              fluticasone (FLONASE) nasal spray              gabapentin (NEURONTIN)              levothyroxine (SYNTHROID)              sertraline (ZOLOFT)                May take these medicines IF NEEDED:             albuterol (VENTOLIN HFA) inhaler              clonazePAM (KLONOPIN)              naphazoline-pheniramine (NAPHCON-A) eye drops             SUMAtriptan (IMITREX)   Please bring all inhalers with you the day of surgery.    As of today, STOP taking any Aspirin (unless otherwise instructed by your surgeon) Aleve, Naproxen, Ibuprofen, Motrin, Advil, Goody's, BC's, all herbal medications, fish oil, and all vitamins.                     Do NOT Smoke (Tobacco/Vaping) for 24 hours prior to your procedure.  If you use a CPAP at night, you may bring your mask/headgear for your overnight stay.   Contacts, glasses, piercing's, hearing aid's, dentures or partials may not be worn into surgery, please bring cases for these belongings.    For patients admitted to the hospital, discharge time will be determined by your treatment team.   Patients discharged the day of surgery will not be allowed to drive home, and someone needs to stay with them for 24  hours.  SURGICAL WAITING ROOM VISITATION Patients having surgery or a procedure may have no more than 2 support people in the waiting area - these visitors may rotate.   Children under the age of 55 must have an adult with them who is not the patient. If the patient needs to stay at the hospital during part of their recovery, the visitor guidelines for inpatient rooms apply. Pre-op nurse will coordinate an appropriate time for 1 support person to accompany patient in pre-op.  This support person may not rotate.   Please refer to the [facility] website for the visitor guidelines for Inpatients (after your surgery is over and you are in a regular room).    Special instructions:   [place]- Preparing For Surgery  Before surgery, you can play an important role. Because skin is not sterile, your  skin needs to be as free of germs as possible. You can reduce the number of germs on your skin by washing with CHG (chlorahexidine gluconate) Soap before surgery.  CHG is an antiseptic cleaner which kills germs and bonds with the skin to continue killing germs even after washing.    Oral Hygiene is also important to reduce your risk of infection.  Remember - BRUSH YOUR TEETH THE MORNING OF SURGERY WITH YOUR REGULAR TOOTHPASTE  Please do not use if you have an allergy to CHG or antibacterial soaps. If your skin becomes reddened/irritated stop using the CHG.  Do not shave (including legs and underarms) for at least 48 hours prior to first CHG shower. It is OK to shave your face.  Please follow these instructions carefully.   Shower the NIGHT BEFORE SURGERY and the MORNING OF SURGERY  If you chose to wash your hair, wash your hair first as usual with your normal shampoo.  After you shampoo, rinse your hair and body thoroughly to remove the shampoo.  Use CHG Soap as you would any other liquid soap. You can apply CHG directly to the skin and wash gently with a scrungie or a clean washcloth.   Apply  the CHG Soap to your body ONLY FROM THE NECK DOWN.  Do not use on open wounds or open sores. Avoid contact with your eyes, ears, mouth and genitals (private parts). Wash Face and genitals (private parts)  with your normal soap.   Wash thoroughly, paying special attention to the area where your surgery will be performed.  Thoroughly rinse your body with warm water from the neck down.  DO NOT shower/wash with your normal soap after using and rinsing off the CHG Soap.  Pat yourself dry with a CLEAN TOWEL.  Wear CLEAN PAJAMAS to bed the night before surgery  Place CLEAN SHEETS on your bed the night before your surgery  DO NOT SLEEP WITH PETS.   Day of Surgery: Take a shower with CHG soap. Do not wear jewelry or makeup Do not wear lotions, powders, perfumes, or deodorant. Do not shave 48 hours prior to surgery.   Do not bring valuables to the hospital.  [facility] is not responsible for any belongings or valuables. Do not wear nail polish, gel polish, artificial nails, or any other type of covering on natural nails (fingers and toes) If you have artificial nails or gel coating that need to be removed by a nail salon, please have this removed prior to surgery. Artificial nails or gel coating may interfere with anesthesia's ability to adequately monitor your vital signs. Wear Clean/Comfortable clothing the morning of surgery Remember to brush your teeth WITH YOUR REGULAR TOOTHPASTE.   Please read over the following fact sheets that you were given.    If you received a COVID test during your pre-op visit  it is requested that you wear a mask when out in public, stay away from anyone that may not be feeling well and notify your surgeon if you develop symptoms. If you have been in contact with anyone that has tested positive in the last 10 days please notify you surgeon.

## 2022-12-26 NOTE — Progress Notes (Addendum)
PCP - Morrie Sheldon, PA-C Cardiologist - denies  PPM/ICD - denies Device Orders - n/a Rep Notified - n/a  Chest x-ray - 12/26/22 EKG - 12/26/22 Stress Test - denies ECHO - denies Cardiac Cath - denies  Sleep Study - denies CPAP - n/a  Fasting Blood Sugar - n/a  Blood Thinner Instructions - n/a Patient was instructed: As of today, STOP taking any Aspirin (unless otherwise instructed by your surgeon) Aleve, Naproxen, Ibuprofen, Motrin, Advil, Goody's, BC's, all herbal medications, fish oil, and all vitamins.   ERAS Protcol - n/a  COVID TEST- n/a   Anesthesia review: yes - abnormal EKG in PAT  Patient denies shortness of breath, fever, cough and chest pain at PAT appointment   All instructions explained to the patient, with a verbal understanding of the material. Patient agrees to go over the instructions while at home for a better understanding. Patient also instructed to self quarantine after being tested for COVID-19. The opportunity to ask questions was provided.

## 2022-12-27 DIAGNOSIS — F3132 Bipolar disorder, current episode depressed, moderate: Secondary | ICD-10-CM | POA: Diagnosis not present

## 2022-12-27 NOTE — Progress Notes (Signed)
Anesthesia Chart Review:  Case: [id number] Date/Time: 12/30/22 0815   Procedure: XI ROBOTIC ASSISTED THORACOSCOPY-LEFT LOWER LOBE WEDGE RESECTION, possible lobectomy (Left: Chest)   Anesthesia type: General   Pre-op diagnosis: LLL PULMONARY NODULE   [facility] ROOM 10 / [facility] OR   Surgeons: Corliss Skains, MD       DISCUSSION: Patient is a 47 year old female scheduled for the above procedure. Incidental LLL lung nodule found on 04/08/22 CTA. Mildly hypermetabolic on 11/20/22 PET Scan. She was evaluated by pulmonology and CT surgery. Per Dr. Lucilla Lame 12/06/22 progress note, "We discussed the risks and benefits of combination procedure with Dr. Tonia Brooms which will include a navigational bronchoscopy with biopsy and marking followed by a left robotic assisted thoracoscopy with left lower lobe wedge resection and possible lobectomy.".   History includes never smoker (+ second hand smoke until age 40), post-operative N/V, Bipolar disorder, anxiety, migraines, dyspnea, hypothyroidism, GERD, spinal surgery (L5-S1 microdiscectomy 02/12/17; L405 OLIF, PSF. Reported panic attack with oxygen mask in the past. She is being evaluated for spinal cord stimulator insertion.   Dr. Cliffton Asters classified her Zubrod Score as 1: Restricted in physical strenuous activity but ambulatory, able to do out light work   Anesthesia team to evaluate on the day of surgery. 12/26/22 CXR is still in process.  VS: BP 106/72   Pulse 74   Temp 36.8 C   Resp 18   Ht 5\' 3"  (1.6 m)   Wt 93.1 kg   SpO2 97%   BMI 36.37 kg/m   PROVIDERS: Odis Luster, PA-C is PCP  Audie Box, DO is pulmonologist  LABS: Labs reviewed: Acceptable for surgery. She need pregnancy test on the day of surgery.  (all labs ordered are listed, but only abnormal results are displayed)  Labs Reviewed  CBC - Abnormal; Notable for the following components:      Result Value   WBC 11.6 (*)    RDW 15.6 (*)    All other components within  normal limits  COMPREHENSIVE METABOLIC PANEL - Abnormal; Notable for the following components:   Glucose, Bld 101 (*)    All other components within normal limits  URINALYSIS, ROUTINE W REFLEX MICROSCOPIC - Abnormal; Notable for the following components:   Color, Urine STRAW (*)    All other components within normal limits  SURGICAL PCR SCREEN  PROTIME-INR  APTT  TYPE AND SCREEN    PFTS 12/02/22: FVC 3.95 (112%), post 4.00 (113%). FEV1 3.06 (107%), post 3.21 (112%). DLCO unc 17.96 (86%), cor 18.37 (88%).   IMAGES: CXR 12/26/22: In process.  PET Scan 11/20/22: IMPRESSION: - 10 mm irregular nodule at the left lung base demonstrates mild hypermetabolism, indeterminate but raising concern for small primary bronchogenic carcinoma. - No findings suspicious for metastatic disease.  CT Chest 10/24/22: IMPRESSION: 1. Left lower lobe pulmonary nodule measures 10 x 9 mm, previously 9 x 8 mm on chest CT and 11 x 9 mm on abdominal CT. Margins are microlobulated. While morphology is suspicious for neoplasm, stability over the course of 9 months is reassuring. Recommend either 6 month chest CT follow-up or PET characterization. 2. An area of subpleural ground-glass in the dependent left lower lobe is slightly smaller than on prior exams. This may represent an area of scarring.    EKG: 12/26/22: Normal sinus rhythm Nonspecific T wave abnormality Abnormal ECG When compared with ECG of 13-May-2022 12:46, PREVIOUS ECG IS PRESENT No significant change since last tracing Confirmed by Arvilla Meres [phone]) on 12/26/2022  10:39:02 PM  CV: N/A  Past Medical History:  Diagnosis Date   Abnormal cervical Papanicolaou smear 04/20/2020   Allergies    Anemia    with pregnancy , resolved after delivery   Anxiety    Bipolar disorder (HCC)    Chicken pox    Chronic sinusitis 07/26/2016   no current problems as of 12/05/22   Complication of anesthesia    pt doesn't like mask she has panic  attack; childhood   Depression    Dyspnea    with intermittent chest tightness at times, has used daughter's albuterol inhaler which has helped per patient 12/05/22   Family history of adverse reaction to anesthesia    Mother PONV   GERD (gastroesophageal reflux disease)    Hypothyroidism    Migraines    Nonsmoker    Pelvic pain in female 05/13/2014   resolved, no current problem as of 12/05/22   Pneumonia    x 1   PONV (postoperative nausea and vomiting)    no issues with short procdures, had one isntance of vomitus following a longer procedure    Sciatica    Thyroid disease     Past Surgical History:  Procedure Laterality Date   ABDOMINAL EXPOSURE N/A 09/21/2020   Procedure: LATERAL EXPOSURE FOR OBLIQUE LATERAL SPINE SURGERY;  Surgeon: Cephus Shelling, MD;  Location: [facility] OR;  Service: Vascular;  Laterality: N/A;   ANTERIOR LATERAL LUMBAR FUSION WITH PERCUTANEOUS SCREW 1 LEVEL N/A 09/21/2020   Procedure: OBLIQUE LUMBAR INTERBODY FUSION LUMBAR FOUR THROUGH FIVE, POSTERIOR SPINAL FUSION INSTRUMENTATION LUMAR FOUR THROUGH FIVE;  Surgeon: Venita Lick, MD;  Location: [facility] OR;  Service: Orthopedics;  Laterality: N/A;   BACK SURGERY     CESAREAN SECTION  x2   2000, 2005   COLONOSCOPY     03/2022   LAPAROSCOPY N/A 05/13/2014   Procedure: LAPAROSCOPY DIAGNOSTIC;  Surgeon: Lavina Hamman, MD;  Location: [facility] ORS;  Service: Gynecology;  Laterality: N/A;   LUMBAR DISC SURGERY  2023   SI joint   LUMBAR LAMINECTOMY/DECOMPRESSION MICRODISCECTOMY Left 02/12/2017   Procedure: Microlumbar decompression L5-S1 left ;  Surgeon: Jene Every, MD;  Location: [facility] ORS;  Service: Orthopedics;  Laterality: Left;  90 mins   SALIVARY GLAND SURGERY  1986   SPINAL CORD STIMULATOR REMOVAL  09/2022   UPPER GI ENDOSCOPY     WISDOM TOOTH EXTRACTION      MEDICATIONS:  albuterol (VENTOLIN HFA) 108 (90 Base) MCG/ACT inhaler   BREO ELLIPTA 100-25 MCG/ACT AEPB   cetirizine (ZYRTEC) 10 MG tablet   clonazePAM  (KLONOPIN) 0.5 MG tablet   EMGALITY 120 MG/ML SOAJ   esomeprazole (NEXIUM) 20 MG capsule   ferrous sulfate 325 (65 FE) MG tablet   fluticasone (FLONASE) 50 MCG/ACT nasal spray   gabapentin (NEURONTIN) 300 MG capsule   hydrocortisone cream 1 %   lamoTRIgine (LAMICTAL) 25 MG tablet   levonorgestrel (MIRENA, 52 MG,) 20 MCG/DAY IUD   levothyroxine (SYNTHROID) 25 MCG tablet   lithium carbonate (LITHOBID) 300 MG ER tablet   mometasone-formoterol (DULERA) 100-5 MCG/ACT AERO   naphazoline-pheniramine (NAPHCON-A) 0.025-0.3 % ophthalmic solution   sertraline (ZOLOFT) 100 MG tablet   SUMAtriptan (IMITREX) 100 MG tablet   traZODone (DESYREL) 100 MG tablet   No current facility-administered medications for this encounter.    Shonna Chock, PA-C Surgical Short Stay/Anesthesiology [facility] Phone [phone] [facility] Phone [phone] 12/27/2022 10:18 AM

## 2022-12-27 NOTE — Anesthesia Preprocedure Evaluation (Signed)
Anesthesia Evaluation  Patient identified by MRN, date of birth, ID band Patient awake    Reviewed: Allergy & Precautions, NPO status , Patient's Chart, lab work & pertinent test results  History of Anesthesia Complications (+) PONV and history of anesthetic complications  Airway Mallampati: I  TM Distance: >3 FB Neck ROM: Full    Dental  (+) Teeth Intact, Dental Advisory Given   Pulmonary    breath sounds clear to auscultation       Cardiovascular negative cardio ROS  Rhythm:Regular Rate:Normal     Neuro/Psych  Headaches PSYCHIATRIC DISORDERS Anxiety Depression Bipolar Disorder    Neuromuscular disease    GI/Hepatic Neg liver ROS,GERD  ,,  Endo/Other  Hypothyroidism    Renal/GU negative Renal ROS     Musculoskeletal  (+) Arthritis ,    Abdominal   Peds  Hematology   Anesthesia Other Findings   Reproductive/Obstetrics                             Anesthesia Physical Anesthesia Plan  ASA: 2  Anesthesia Plan: General   Post-op Pain Management: Tylenol PO (pre-op)* and Toradol IV (intra-op)*   Induction: Intravenous  PONV Risk Score and Plan: 4 or greater and Ondansetron, Dexamethasone, Midazolam and Scopolamine patch - Pre-op  Airway Management Planned: Double Lumen EBT  Additional Equipment: ClearSight  Intra-op Plan:   Post-operative Plan: Extubation in OR  Informed Consent: I have reviewed the patients History and Physical, chart, labs and discussed the procedure including the risks, benefits and alternatives for the proposed anesthesia with the patient or authorized representative who has indicated his/her understanding and acceptance.     Dental advisory given  Plan Discussed with: CRNA  Anesthesia Plan Comments: (PAT note written 12/27/2022 by Shonna Chock, PA-C.  )       Anesthesia Quick Evaluation

## 2022-12-30 ENCOUNTER — Encounter (HOSPITAL_COMMUNITY): Payer: Self-pay | Admitting: Pulmonary Disease

## 2022-12-30 ENCOUNTER — Inpatient Hospital Stay (HOSPITAL_COMMUNITY): Payer: BC Managed Care – PPO

## 2022-12-30 ENCOUNTER — Ambulatory Visit (HOSPITAL_COMMUNITY): Payer: BC Managed Care – PPO

## 2022-12-30 ENCOUNTER — Encounter (HOSPITAL_COMMUNITY)
Admission: RE | Disposition: A | Discharge status: Home / Self Care | Payer: Self-pay | Source: Home / Self Care | Attending: Thoracic Surgery (Cardiothoracic Vascular Surgery)

## 2022-12-30 ENCOUNTER — Ambulatory Visit (HOSPITAL_COMMUNITY): Payer: BC Managed Care – PPO | Admitting: Registered Nurse

## 2022-12-30 ENCOUNTER — Other Ambulatory Visit: Payer: Self-pay

## 2022-12-30 ENCOUNTER — Inpatient Hospital Stay (HOSPITAL_COMMUNITY)
Admission: RE | Admit: 2022-12-30 | Discharge: 2022-12-31 | DRG: 165 | Disposition: A | Discharge status: Home / Self Care | Payer: BC Managed Care – PPO | Attending: Thoracic Surgery (Cardiothoracic Vascular Surgery) | Admitting: Thoracic Surgery (Cardiothoracic Vascular Surgery)

## 2022-12-30 ENCOUNTER — Ambulatory Visit (HOSPITAL_COMMUNITY): Payer: BC Managed Care – PPO | Admitting: Vascular Surgery

## 2022-12-30 DIAGNOSIS — M5136 Other intervertebral disc degeneration, lumbar region: Secondary | ICD-10-CM | POA: Diagnosis present

## 2022-12-30 DIAGNOSIS — Z981 Arthrodesis status: Secondary | ICD-10-CM | POA: Diagnosis not present

## 2022-12-30 DIAGNOSIS — G8929 Other chronic pain: Secondary | ICD-10-CM | POA: Diagnosis not present

## 2022-12-30 DIAGNOSIS — R846 Abnormal cytological findings in specimens from respiratory organs and thorax: Secondary | ICD-10-CM | POA: Diagnosis not present

## 2022-12-30 DIAGNOSIS — E039 Hypothyroidism, unspecified: Secondary | ICD-10-CM | POA: Diagnosis present

## 2022-12-30 DIAGNOSIS — Z79890 Hormone replacement therapy (postmenopausal): Secondary | ICD-10-CM

## 2022-12-30 DIAGNOSIS — J45909 Unspecified asthma, uncomplicated: Secondary | ICD-10-CM | POA: Diagnosis present

## 2022-12-30 DIAGNOSIS — Z801 Family history of malignant neoplasm of trachea, bronchus and lung: Secondary | ICD-10-CM

## 2022-12-30 DIAGNOSIS — Z888 Allergy status to other drugs, medicaments and biological substances status: Secondary | ICD-10-CM

## 2022-12-30 DIAGNOSIS — F319 Bipolar disorder, unspecified: Secondary | ICD-10-CM | POA: Diagnosis not present

## 2022-12-30 DIAGNOSIS — Z818 Family history of other mental and behavioral disorders: Secondary | ICD-10-CM | POA: Diagnosis not present

## 2022-12-30 DIAGNOSIS — Z803 Family history of malignant neoplasm of breast: Secondary | ICD-10-CM | POA: Diagnosis not present

## 2022-12-30 DIAGNOSIS — R079 Chest pain, unspecified: Secondary | ICD-10-CM | POA: Diagnosis not present

## 2022-12-30 DIAGNOSIS — Z1152 Encounter for screening for COVID-19: Secondary | ICD-10-CM | POA: Diagnosis not present

## 2022-12-30 DIAGNOSIS — Z800 Family history of malignant neoplasm of digestive organs: Secondary | ICD-10-CM | POA: Diagnosis not present

## 2022-12-30 DIAGNOSIS — K219 Gastro-esophageal reflux disease without esophagitis: Secondary | ICD-10-CM | POA: Diagnosis not present

## 2022-12-30 DIAGNOSIS — R911 Solitary pulmonary nodule: Principal | ICD-10-CM | POA: Diagnosis present

## 2022-12-30 DIAGNOSIS — Z4682 Encounter for fitting and adjustment of non-vascular catheter: Secondary | ICD-10-CM | POA: Diagnosis not present

## 2022-12-30 DIAGNOSIS — Z833 Family history of diabetes mellitus: Secondary | ICD-10-CM

## 2022-12-30 DIAGNOSIS — D1432 Benign neoplasm of left bronchus and lung: Secondary | ICD-10-CM | POA: Diagnosis not present

## 2022-12-30 DIAGNOSIS — Z79899 Other long term (current) drug therapy: Secondary | ICD-10-CM

## 2022-12-30 DIAGNOSIS — Z7722 Contact with and (suspected) exposure to environmental tobacco smoke (acute) (chronic): Secondary | ICD-10-CM | POA: Diagnosis present

## 2022-12-30 DIAGNOSIS — Z7951 Long term (current) use of inhaled steroids: Secondary | ICD-10-CM

## 2022-12-30 DIAGNOSIS — R918 Other nonspecific abnormal finding of lung field: Principal | ICD-10-CM | POA: Diagnosis present

## 2022-12-30 DIAGNOSIS — Z885 Allergy status to narcotic agent status: Secondary | ICD-10-CM

## 2022-12-30 DIAGNOSIS — T797XXA Traumatic subcutaneous emphysema, initial encounter: Secondary | ICD-10-CM | POA: Diagnosis not present

## 2022-12-30 DIAGNOSIS — J939 Pneumothorax, unspecified: Secondary | ICD-10-CM | POA: Diagnosis not present

## 2022-12-30 DIAGNOSIS — Z87891 Personal history of nicotine dependence: Secondary | ICD-10-CM | POA: Diagnosis not present

## 2022-12-30 DIAGNOSIS — M549 Dorsalgia, unspecified: Secondary | ICD-10-CM | POA: Diagnosis present

## 2022-12-30 DIAGNOSIS — J9811 Atelectasis: Secondary | ICD-10-CM | POA: Diagnosis not present

## 2022-12-30 HISTORY — PX: FIDUCIAL MARKER PLACEMENT: SHX6858

## 2022-12-30 HISTORY — PX: LYMPH NODE DISSECTION: SHX5087

## 2022-12-30 HISTORY — PX: BIOPSY: SHX5522

## 2022-12-30 LAB — POCT PREGNANCY, URINE: Preg Test, Ur: NEGATIVE

## 2022-12-30 LAB — SARS CORONAVIRUS 2 BY RT PCR: SARS Coronavirus 2 by RT PCR: NEGATIVE

## 2022-12-30 SURGERY — BRONCHOSCOPY, WITH BIOPSY USING ELECTROMAGNETIC NAVIGATION
Anesthesia: General

## 2022-12-30 SURGERY — WEDGE RESECTION, LUNG, ROBOT-ASSISTED, THORACOSCOPIC
Anesthesia: General | Site: Chest | Laterality: Left

## 2022-12-30 MED ORDER — SERTRALINE HCL 50 MG PO TABS: 150.0000 mg | ORAL_TABLET | Freq: Every day | ORAL | Status: DC

## 2022-12-30 MED ORDER — ACETAMINOPHEN 160 MG/5ML PO SOLN: 325.0000 mg | ORAL | Status: DC | PRN

## 2022-12-30 MED ORDER — OXYCODONE HCL 5 MG/5ML PO SOLN: 5.0000 mg | Freq: Once | ORAL | Status: DC | PRN

## 2022-12-30 MED ORDER — LITHIUM CARBONATE ER 450 MG PO TBCR
900.0000 mg | EXTENDED_RELEASE_TABLET | Freq: Every evening | ORAL | Status: DC
  Administered 2022-12-30: 900 mg via ORAL
  Filled 2022-12-30 (×2): qty 2

## 2022-12-30 MED ORDER — FENTANYL CITRATE (PF) 250 MCG/5ML IJ SOLN
INTRAMUSCULAR | Status: AC
  Filled 2022-12-30: qty 5

## 2022-12-30 MED ORDER — INDOCYANINE GREEN 25 MG IV SOLR
INTRAVENOUS | Status: AC
  Filled 2022-12-30: qty 10

## 2022-12-30 MED ORDER — OXYCODONE HCL 5 MG PO TABS: 5.0000 mg | ORAL_TABLET | Freq: Once | ORAL | Status: DC | PRN

## 2022-12-30 MED ORDER — SODIUM CHLORIDE FLUSH 0.9 % IV SOLN
INTRAVENOUS | Status: DC | PRN
  Administered 2022-12-30: 100 mL

## 2022-12-30 MED ORDER — METHYLENE BLUE 1 % INJ SOLN
INTRAVENOUS | Status: AC
  Filled 2022-12-30: qty 10

## 2022-12-30 MED ORDER — ACETAMINOPHEN 10 MG/ML IV SOLN
INTRAVENOUS | Status: AC
  Filled 2022-12-30: qty 100

## 2022-12-30 MED ORDER — ORAL CARE MOUTH RINSE: 15.0000 mL | OROMUCOSAL | Status: DC | PRN

## 2022-12-30 MED ORDER — KETOROLAC TROMETHAMINE 15 MG/ML IJ SOLN
15.0000 mg | Freq: Four times a day (QID) | INTRAMUSCULAR | Status: DC
  Administered 2022-12-30 – 2022-12-31 (×4): 15 mg via INTRAVENOUS
  Filled 2022-12-30 (×4): qty 1

## 2022-12-30 MED ORDER — CLONAZEPAM 0.5 MG PO TABS
0.5000 mg | ORAL_TABLET | Freq: Three times a day (TID) | ORAL | Status: DC | PRN
  Administered 2022-12-30: 0.5 mg via ORAL
  Filled 2022-12-30 (×2): qty 1

## 2022-12-30 MED ORDER — PHENYLEPHRINE HCL-NACL 20-0.9 MG/250ML-% IV SOLN
INTRAVENOUS | Status: DC | PRN
  Administered 2022-12-30: 20 ug/min via INTRAVENOUS

## 2022-12-30 MED ORDER — SCOPOLAMINE 1 MG/3DAYS TD PT72
MEDICATED_PATCH | TRANSDERMAL | Status: AC
  Filled 2022-12-30: qty 1

## 2022-12-30 MED ORDER — ALBUTEROL SULFATE (2.5 MG/3ML) 0.083% IN NEBU: 2.5000 mg | INHALATION_SOLUTION | RESPIRATORY_TRACT | Status: DC

## 2022-12-30 MED ORDER — SENNOSIDES-DOCUSATE SODIUM 8.6-50 MG PO TABS
1.0000 | ORAL_TABLET | Freq: Every day | ORAL | Status: DC
  Administered 2022-12-30: 1 via ORAL
  Filled 2022-12-30: qty 1

## 2022-12-30 MED ORDER — LACTATED RINGERS IV SOLN: INTRAVENOUS | Status: DC | PRN

## 2022-12-30 MED ORDER — FLUTICASONE FUROATE-VILANTEROL 100-25 MCG/ACT IN AEPB
1.0000 | INHALATION_SPRAY | Freq: Every day | RESPIRATORY_TRACT | Status: DC
  Administered 2022-12-31: 1 via RESPIRATORY_TRACT
  Filled 2022-12-30: qty 28

## 2022-12-30 MED ORDER — FENTANYL CITRATE (PF) 250 MCG/5ML IJ SOLN
INTRAMUSCULAR | Status: DC | PRN
  Administered 2022-12-30: 100 ug via INTRAVENOUS
  Administered 2022-12-30: 50 ug via INTRAVENOUS

## 2022-12-30 MED ORDER — TRAZODONE HCL 50 MG PO TABS
100.0000 mg | ORAL_TABLET | Freq: Every day | ORAL | Status: DC
  Administered 2022-12-30: 100 mg via ORAL
  Filled 2022-12-30: qty 2

## 2022-12-30 MED ORDER — CEFAZOLIN SODIUM-DEXTROSE 2-4 GM/100ML-% IV SOLN
2.0000 g | INTRAVENOUS | Status: AC
  Administered 2022-12-30: 2 g via INTRAVENOUS
  Filled 2022-12-30: qty 100

## 2022-12-30 MED ORDER — PROMETHAZINE HCL 25 MG/ML IJ SOLN: 6.2500 mg | INTRAMUSCULAR | Status: DC | PRN

## 2022-12-30 MED ORDER — FLUTICASONE PROPIONATE 50 MCG/ACT NA SUSP
2.0000 | Freq: Every day | NASAL | Status: DC
  Filled 2022-12-30: qty 16

## 2022-12-30 MED ORDER — ACETAMINOPHEN 10 MG/ML IV SOLN: 1000.0000 mg | Freq: Once | INTRAVENOUS | Status: DC | PRN

## 2022-12-30 MED ORDER — ALUM & MAG HYDROXIDE-SIMETH 200-200-20 MG/5ML PO SUSP: 30.0000 mL | ORAL | Status: DC | PRN

## 2022-12-30 MED ORDER — PANTOPRAZOLE SODIUM 40 MG PO TBEC
40.0000 mg | DELAYED_RELEASE_TABLET | Freq: Every day | ORAL | Status: DC
  Administered 2022-12-31: 40 mg via ORAL
  Filled 2022-12-30: qty 1

## 2022-12-30 MED ORDER — LEVOTHYROXINE SODIUM 25 MCG PO TABS
25.0000 ug | ORAL_TABLET | Freq: Every day | ORAL | Status: DC
  Administered 2022-12-31: 25 ug via ORAL
  Filled 2022-12-30: qty 1

## 2022-12-30 MED ORDER — ROCURONIUM BROMIDE 10 MG/ML (PF) SYRINGE
PREFILLED_SYRINGE | INTRAVENOUS | Status: DC | PRN
  Administered 2022-12-30: 30 mg via INTRAVENOUS
  Administered 2022-12-30: 40 mg via INTRAVENOUS
  Administered 2022-12-30: 60 mg via INTRAVENOUS
  Administered 2022-12-30: 50 mg via INTRAVENOUS

## 2022-12-30 MED ORDER — SERTRALINE HCL 25 MG PO TABS
150.0000 mg | ORAL_TABLET | Freq: Every day | ORAL | Status: DC
  Administered 2022-12-30: 150 mg via ORAL
  Filled 2022-12-30: qty 2

## 2022-12-30 MED ORDER — ACETAMINOPHEN 325 MG PO TABS: 325.0000 mg | ORAL_TABLET | ORAL | Status: DC | PRN

## 2022-12-30 MED ORDER — GABAPENTIN 300 MG PO CAPS
300.0000 mg | ORAL_CAPSULE | Freq: Three times a day (TID) | ORAL | Status: DC
  Administered 2022-12-30 – 2022-12-31 (×3): 300 mg via ORAL
  Filled 2022-12-30 (×3): qty 1

## 2022-12-30 MED ORDER — CEFAZOLIN SODIUM-DEXTROSE 2-4 GM/100ML-% IV SOLN
2.0000 g | Freq: Three times a day (TID) | INTRAVENOUS | Status: AC
  Administered 2022-12-30 (×2): 2 g via INTRAVENOUS
  Filled 2022-12-30 (×2): qty 100

## 2022-12-30 MED ORDER — ENOXAPARIN SODIUM 40 MG/0.4ML IJ SOSY
40.0000 mg | PREFILLED_SYRINGE | Freq: Every day | INTRAMUSCULAR | Status: DC
  Administered 2022-12-30 – 2022-12-31 (×2): 40 mg via SUBCUTANEOUS
  Filled 2022-12-30 (×2): qty 0.4

## 2022-12-30 MED ORDER — SUGAMMADEX SODIUM 200 MG/2ML IV SOLN
INTRAVENOUS | Status: DC | PRN
  Administered 2022-12-30: 200 mg via INTRAVENOUS

## 2022-12-30 MED ORDER — DEXAMETHASONE SODIUM PHOSPHATE 10 MG/ML IJ SOLN
INTRAMUSCULAR | Status: AC
  Filled 2022-12-30: qty 1

## 2022-12-30 MED ORDER — LORATADINE 10 MG PO TABS: 10.0000 mg | ORAL_TABLET | Freq: Every day | ORAL | Status: DC | PRN

## 2022-12-30 MED ORDER — PROPOFOL 10 MG/ML IV BOLUS
INTRAVENOUS | Status: AC
  Filled 2022-12-30: qty 20

## 2022-12-30 MED ORDER — SUMATRIPTAN SUCCINATE 100 MG PO TABS
100.0000 mg | ORAL_TABLET | ORAL | Status: DC | PRN
  Filled 2022-12-30: qty 1

## 2022-12-30 MED ORDER — BUPIVACAINE HCL (PF) 0.5 % IJ SOLN
INTRAMUSCULAR | Status: AC
  Filled 2022-12-30: qty 30

## 2022-12-30 MED ORDER — LAMOTRIGINE 100 MG PO TABS
150.0000 mg | ORAL_TABLET | Freq: Every day | ORAL | Status: DC
  Administered 2022-12-30: 150 mg via ORAL
  Filled 2022-12-30: qty 1
  Filled 2022-12-30: qty 2

## 2022-12-30 MED ORDER — HEMOSTATIC AGENTS (NO CHARGE) OPTIME
TOPICAL | Status: DC | PRN
  Administered 2022-12-30: 1 via TOPICAL

## 2022-12-30 MED ORDER — LUNG SURGERY BOOK
Freq: Once | Status: AC
  Administered 2022-12-30: 1
  Filled 2022-12-30: qty 1

## 2022-12-30 MED ORDER — OXYCODONE HCL 5 MG PO TABS
5.0000 mg | ORAL_TABLET | ORAL | Status: DC | PRN
  Administered 2022-12-30: 10 mg via ORAL
  Administered 2022-12-30: 5 mg via ORAL
  Filled 2022-12-30: qty 1
  Filled 2022-12-30: qty 2

## 2022-12-30 MED ORDER — 0.9 % SODIUM CHLORIDE (POUR BTL) OPTIME
TOPICAL | Status: DC | PRN
  Administered 2022-12-30: 2000 mL

## 2022-12-30 MED ORDER — ACETAMINOPHEN 500 MG PO TABS
1000.0000 mg | ORAL_TABLET | Freq: Once | ORAL | Status: AC
  Administered 2022-12-30: 1000 mg via ORAL

## 2022-12-30 MED ORDER — BISACODYL 5 MG PO TBEC
10.0000 mg | DELAYED_RELEASE_TABLET | Freq: Every day | ORAL | Status: DC
  Administered 2022-12-30 – 2022-12-31 (×2): 10 mg via ORAL
  Filled 2022-12-30 (×2): qty 2

## 2022-12-30 MED ORDER — MIDAZOLAM HCL 2 MG/2ML IJ SOLN
INTRAMUSCULAR | Status: DC | PRN
  Administered 2022-12-30: 2 mg via INTRAVENOUS

## 2022-12-30 MED ORDER — ONDANSETRON HCL 4 MG/2ML IJ SOLN
INTRAMUSCULAR | Status: AC
  Filled 2022-12-30: qty 2

## 2022-12-30 MED ORDER — BUPIVACAINE LIPOSOME 1.3 % IJ SUSP
INTRAMUSCULAR | Status: AC
  Filled 2022-12-30: qty 20

## 2022-12-30 MED ORDER — ALBUTEROL SULFATE (2.5 MG/3ML) 0.083% IN NEBU
2.5000 mg | INHALATION_SOLUTION | Freq: Four times a day (QID) | RESPIRATORY_TRACT | Status: DC
  Filled 2022-12-30: qty 3

## 2022-12-30 MED ORDER — CHLORHEXIDINE GLUCONATE CLOTH 2 % EX PADS
6.0000 | MEDICATED_PAD | Freq: Every day | CUTANEOUS | Status: DC
  Administered 2022-12-30 – 2022-12-31 (×2): 6 via TOPICAL

## 2022-12-30 MED ORDER — DEXAMETHASONE SODIUM PHOSPHATE 10 MG/ML IJ SOLN
INTRAMUSCULAR | Status: DC | PRN
  Administered 2022-12-30: 10 mg via INTRAVENOUS

## 2022-12-30 MED ORDER — LIDOCAINE 2% (20 MG/ML) 5 ML SYRINGE
INTRAMUSCULAR | Status: DC | PRN
  Administered 2022-12-30: 40 mg via INTRAVENOUS

## 2022-12-30 MED ORDER — ACETAMINOPHEN 160 MG/5ML PO SOLN: 1000.0000 mg | Freq: Four times a day (QID) | ORAL | Status: DC

## 2022-12-30 MED ORDER — TRAMADOL HCL 50 MG PO TABS
50.0000 mg | ORAL_TABLET | Freq: Four times a day (QID) | ORAL | Status: DC | PRN
  Administered 2022-12-30: 100 mg via ORAL
  Filled 2022-12-30: qty 2

## 2022-12-30 MED ORDER — FERROUS SULFATE 325 (65 FE) MG PO TABS
325.0000 mg | ORAL_TABLET | Freq: Every day | ORAL | Status: DC
  Administered 2022-12-31: 325 mg via ORAL
  Filled 2022-12-30: qty 1

## 2022-12-30 MED ORDER — ESMOLOL HCL 100 MG/10ML IV SOLN
INTRAVENOUS | Status: AC
  Filled 2022-12-30: qty 10

## 2022-12-30 MED ORDER — ALBUTEROL SULFATE (2.5 MG/3ML) 0.083% IN NEBU: 2.5000 mg | INHALATION_SOLUTION | RESPIRATORY_TRACT | Status: DC | PRN

## 2022-12-30 MED ORDER — CHLORHEXIDINE GLUCONATE 0.12 % MT SOLN
OROMUCOSAL | Status: AC
  Administered 2022-12-30: 15 mL
  Filled 2022-12-30: qty 15

## 2022-12-30 MED ORDER — HYDROCORTISONE 1 % EX CREA
1.0000 | TOPICAL_CREAM | CUTANEOUS | Status: DC | PRN
  Filled 2022-12-30: qty 28

## 2022-12-30 MED ORDER — ACETAMINOPHEN 500 MG PO TABS
1000.0000 mg | ORAL_TABLET | Freq: Four times a day (QID) | ORAL | Status: DC
  Administered 2022-12-30 – 2022-12-31 (×4): 1000 mg via ORAL
  Filled 2022-12-30 (×5): qty 2

## 2022-12-30 MED ORDER — PROPOFOL 500 MG/50ML IV EMUL
INTRAVENOUS | Status: DC | PRN
  Administered 2022-12-30: 125 ug/kg/min via INTRAVENOUS
  Administered 2022-12-30: 105 ug/kg/min via INTRAVENOUS
  Administered 2022-12-30: 125 ug/kg/min via INTRAVENOUS

## 2022-12-30 MED ORDER — PHENYLEPHRINE 80 MCG/ML (10ML) SYRINGE FOR IV PUSH (FOR BLOOD PRESSURE SUPPORT)
PREFILLED_SYRINGE | INTRAVENOUS | Status: DC | PRN
  Administered 2022-12-30: 80 ug via INTRAVENOUS

## 2022-12-30 MED ORDER — PROPOFOL 10 MG/ML IV BOLUS
INTRAVENOUS | Status: DC | PRN
  Administered 2022-12-30: 160 mg via INTRAVENOUS

## 2022-12-30 MED ORDER — ONDANSETRON HCL 4 MG/2ML IJ SOLN
INTRAMUSCULAR | Status: DC | PRN
  Administered 2022-12-30: 4 mg via INTRAVENOUS

## 2022-12-30 MED ORDER — SCOPOLAMINE 1 MG/3DAYS TD PT72
1.0000 | MEDICATED_PATCH | TRANSDERMAL | Status: DC
  Administered 2022-12-30: 1 via TRANSDERMAL

## 2022-12-30 MED ORDER — ONDANSETRON HCL 4 MG/2ML IJ SOLN: 4.0000 mg | Freq: Four times a day (QID) | INTRAMUSCULAR | Status: DC | PRN

## 2022-12-30 MED ORDER — AMISULPRIDE (ANTIEMETIC) 5 MG/2ML IV SOLN: 10.0000 mg | Freq: Once | INTRAVENOUS | Status: DC | PRN

## 2022-12-30 MED ORDER — FENTANYL CITRATE (PF) 100 MCG/2ML IJ SOLN
25.0000 ug | INTRAMUSCULAR | Status: DC | PRN
  Administered 2022-12-30: 50 ug via INTRAVENOUS

## 2022-12-30 MED ORDER — FENTANYL CITRATE (PF) 100 MCG/2ML IJ SOLN
INTRAMUSCULAR | Status: AC
  Filled 2022-12-30: qty 2

## 2022-12-30 MED ORDER — LACTATED RINGERS IV SOLN: INTRAVENOUS | Status: DC

## 2022-12-30 MED ORDER — MIDAZOLAM HCL 2 MG/2ML IJ SOLN
INTRAMUSCULAR | Status: AC
  Filled 2022-12-30: qty 2

## 2022-12-30 MED ORDER — PROPOFOL 1000 MG/100ML IV EMUL
INTRAVENOUS | Status: AC
  Filled 2022-12-30: qty 200

## 2022-12-30 SURGICAL SUPPLY — 87 items
ADH SKN CLS APL DERMABOND .7 (GAUZE/BANDAGES/DRESSINGS) ×2
APL PRP STRL LF DISP 70% ISPRP (MISCELLANEOUS) ×2
BAG TISS RTRVL C300 12X14 (MISCELLANEOUS) ×2
BLADE CLIPPER SURG (BLADE) ×2 IMPLANT
CANISTER SUCT 3000ML PPV (MISCELLANEOUS) ×4 IMPLANT
CANNULA REDUCER 12-8 DVNC XI (CANNULA) ×4 IMPLANT
CATH THORACIC 28FR (CATHETERS) ×2 IMPLANT
CHLORAPREP W/TINT 26 (MISCELLANEOUS) ×2 IMPLANT
CLIP TI MEDIUM 6 (CLIP) IMPLANT
CNTNR URN SCR LID CUP LEK RST (MISCELLANEOUS) ×10 IMPLANT
CONN ST 1/4X3/8 BEN (MISCELLANEOUS) IMPLANT
CONT SPEC 4OZ STRL OR WHT (MISCELLANEOUS) ×10
DEFOGGER SCOPE WARMER CLEARIFY (MISCELLANEOUS) ×2 IMPLANT
DERMABOND ADVANCED .7 DNX12 (GAUZE/BANDAGES/DRESSINGS) ×2 IMPLANT
DRAIN CHANNEL 28F RND 3/8 FF (WOUND CARE) IMPLANT
DRAIN CHANNEL 32F RND 10.7 FF (WOUND CARE) IMPLANT
DRAPE ARM DVNC X/XI (DISPOSABLE) ×8 IMPLANT
DRAPE COLUMN DVNC XI (DISPOSABLE) ×2 IMPLANT
DRAPE CV SPLIT W-CLR ANES SCRN (DRAPES) ×2 IMPLANT
DRAPE HALF SHEET 40X57 (DRAPES) ×2 IMPLANT
DRAPE ORTHO SPLIT 77X108 STRL (DRAPES) ×2
DRAPE SURG ORHT 6 SPLT 77X108 (DRAPES) ×2 IMPLANT
ELECT BLADE 6.5 EXT (BLADE) IMPLANT
ELECT REM PT RETURN 9FT ADLT (ELECTROSURGICAL) ×2
ELECTRODE REM PT RTRN 9FT ADLT (ELECTROSURGICAL) ×2 IMPLANT
FORCEPS BPLR LNG DVNC XI (INSTRUMENTS) IMPLANT
FORCEPS CADIERE DVNC XI (FORCEP) IMPLANT
GAUZE KITTNER 4X5 RF (MISCELLANEOUS) ×2 IMPLANT
GAUZE SPONGE 4X4 12PLY STRL (GAUZE/BANDAGES/DRESSINGS) ×2 IMPLANT
GLOVE BIO SURGEON STRL SZ7.5 (GLOVE) ×4 IMPLANT
GLOVE SURG SS PI 8.0 STRL IVOR (GLOVE) ×2 IMPLANT
GOWN STRL REUS W/ TWL LRG LVL3 (GOWN DISPOSABLE) ×4 IMPLANT
GOWN STRL REUS W/ TWL XL LVL3 (GOWN DISPOSABLE) ×4 IMPLANT
GOWN STRL REUS W/TWL 2XL LVL3 (GOWN DISPOSABLE) ×2 IMPLANT
GOWN STRL REUS W/TWL LRG LVL3 (GOWN DISPOSABLE) ×4
GOWN STRL REUS W/TWL XL LVL3 (GOWN DISPOSABLE) ×4
GRASPER TIP-UP FEN DVNC XI (INSTRUMENTS) IMPLANT
HEMOSTAT SURGICEL 2X14 (HEMOSTASIS) ×6 IMPLANT
IRRIGATOR SUCT 8 DISP DVNC XI (IRRIGATION / IRRIGATOR) IMPLANT
KIT BASIN OR (CUSTOM PROCEDURE TRAY) ×2 IMPLANT
KIT TURNOVER KIT B (KITS) ×2 IMPLANT
NDL 22X1.5 STRL (OR ONLY) (MISCELLANEOUS) ×2 IMPLANT
NEEDLE 22X1.5 STRL (OR ONLY) (MISCELLANEOUS) ×2 IMPLANT
NS IRRIG 1000ML POUR BTL (IV SOLUTION) ×6 IMPLANT
PACK CHEST (CUSTOM PROCEDURE TRAY) ×2 IMPLANT
PAD ARMBOARD 7.5X6 YLW CONV (MISCELLANEOUS) ×10 IMPLANT
PORT ACCESS TROCAR AIRSEAL 12 (TROCAR) ×2 IMPLANT
RELOAD STAPLE 45 3.5 BLU DVNC (STAPLE) IMPLANT
RELOAD STAPLE 45 4.3 GRN DVNC (STAPLE) IMPLANT
RELOAD STAPLE 45 4.6 BLK DVNC (STAPLE) IMPLANT
RELOAD STAPLER 3.5X45 BLU DVNC (STAPLE) ×4 IMPLANT
RELOAD STAPLER 4.3X45 GRN DVNC (STAPLE) ×4 IMPLANT
RELOAD STAPLER 45 4.6 BLK DVNC (STAPLE) ×2 IMPLANT
SEAL UNIV 5-12 XI (MISCELLANEOUS) ×8 IMPLANT
SET TRI-LUMEN FLTR TB AIRSEAL (TUBING) ×2 IMPLANT
SOL ELECTROSURG ANTI STICK (MISCELLANEOUS) ×2
SOLUTION ELECTROSURG ANTI STCK (MISCELLANEOUS) ×2 IMPLANT
STAPLER 45 SUREFORM CVD DVNC (STAPLE) IMPLANT
STAPLER RELOAD 3.5X45 BLU DVNC (STAPLE) ×4
STAPLER RELOAD 4.3X45 GRN DVNC (STAPLE) ×4
STAPLER RELOAD 45 4.6 BLK DVNC (STAPLE) ×2
STOPCOCK 4 WAY LG BORE MALE ST (IV SETS) ×2 IMPLANT
SUT PDS AB 1 CTX 36 (SUTURE) IMPLANT
SUT PROLENE 4 0 RB 1 (SUTURE)
SUT PROLENE 4-0 RB1 .5 CRCL 36 (SUTURE) IMPLANT
SUT SILK 1 MH (SUTURE) ×2 IMPLANT
SUT SILK 2 0 SH (SUTURE) IMPLANT
SUT SILK 2 0SH CR/8 30 (SUTURE) IMPLANT
SUT VIC AB 1 CTX 36 (SUTURE)
SUT VIC AB 1 CTX36XBRD ANBCTR (SUTURE) IMPLANT
SUT VIC AB 2-0 CT1 27 (SUTURE) ×2
SUT VIC AB 2-0 CT1 TAPERPNT 27 (SUTURE) ×2 IMPLANT
SUT VIC AB 3-0 SH 27 (SUTURE) ×6
SUT VIC AB 3-0 SH 27X BRD (SUTURE) ×6 IMPLANT
SUT VICRYL 0 TIES 12 18 (SUTURE) ×2 IMPLANT
SUT VICRYL 0 UR6 27IN ABS (SUTURE) ×4 IMPLANT
SYR 10ML LL (SYRINGE) ×2 IMPLANT
SYR 20ML LL LF (SYRINGE) ×2 IMPLANT
SYR 50ML LL SCALE MARK (SYRINGE) ×2 IMPLANT
SYSTEM RETRIEVAL ANCHOR 12 (MISCELLANEOUS) IMPLANT
SYSTEM SAHARA CHEST DRAIN ATS (WOUND CARE) ×2 IMPLANT
TAPE CLOTH 4X10 WHT NS (GAUZE/BANDAGES/DRESSINGS) ×2 IMPLANT
TAPE CLOTH SURG 4X10 WHT LF (GAUZE/BANDAGES/DRESSINGS) IMPLANT
TOWEL GREEN STERILE (TOWEL DISPOSABLE) ×2 IMPLANT
TRAY FOLEY MTR SLVR 16FR STAT (SET/KITS/TRAYS/PACK) ×2 IMPLANT
TUBING EXTENTION W/L.L. (IV SETS) ×2 IMPLANT
WATER STERILE IRR 1000ML POUR (IV SOLUTION) ×2 IMPLANT

## 2022-12-30 NOTE — Anesthesia Postprocedure Evaluation (Signed)
Anesthesia Post Note  Patient: Sheila Mathews  Procedure(s) Performed: XI ROBOTIC ASSISTED THORACOSCOPY-LEFT LOWER LOBE WEDGE RESECTION (Left: Chest) LYMPH NODE DISSECTION (Left)     Patient location during evaluation: PACU Anesthesia Type: General Level of consciousness: awake and alert Pain management: pain level controlled Vital Signs Assessment: post-procedure vital signs reviewed and stable Respiratory status: spontaneous breathing, nonlabored ventilation, respiratory function stable and patient connected to nasal cannula oxygen Cardiovascular status: blood pressure returned to baseline and stable Postop Assessment: no apparent nausea or vomiting Anesthetic complications: no  No notable events documented.  Last Vitals:  Vitals:   12/30/22 1330 12/30/22 1400  BP: 110/72 (!) 133/97  Pulse: 70 74  Resp: 17 13  Temp:    SpO2: 98% 98%    Last Pain:  Vitals:   12/30/22 1230  TempSrc:   PainSc: 0-No pain                 Shelton Silvas

## 2022-12-30 NOTE — Transfer of Care (Signed)
Immediate Anesthesia Transfer of Care Note  Patient: Sheila Mathews  Procedure(s) Performed: XI ROBOTIC ASSISTED THORACOSCOPY-LEFT LOWER LOBE WEDGE RESECTION (Left: Chest) LYMPH NODE DISSECTION (Left)  Patient Location: PACU  Anesthesia Type:General  Level of Consciousness: awake, drowsy, and patient cooperative  Airway & Oxygen Therapy: Patient Spontanous Breathing  Post-op Assessment: Report given to RN and Post -op Vital signs reviewed and stable  Post vital signs: Reviewed and stable  Last Vitals:  Vitals Value Taken Time  BP 113/71 12/30/22 1108  Temp    Pulse 85 12/30/22 1111  Resp 17 12/30/22 1111  SpO2 98 % 12/30/22 1111  Vitals shown include unvalidated device data.  Last Pain:  Vitals:   12/30/22 0623  TempSrc:   PainSc: 0-No pain         Complications: No notable events documented.

## 2022-12-30 NOTE — Discharge Summary (Signed)
Physician Discharge Summary       [address] [place]       [name] [address]             [phone]    Patient ID: Sheila Mathews MRN: [id number] DOB/AGE: [birth year] 47 y.o.  Admit date: 12/30/2022 Discharge date: 12/31/2022   Admission Diagnoses:  Pulmonary nodule left lower lobe Degenerative disc disease lumbar spine Bipolar disorder  Discharge Diagnoses:   Pulmonary nodule left lower lobe Degenerative disc disease lumbar spine Bipolar disorder   Consults: None  PATH: Pending  Treatment: Surgery  Procedure:  12/30/2022   Patient:  Sheila Mathews Pre-Op Dx: Left lower lobe pulmonary nodule   Post-op Dx:  same Procedure: - Robotic assisted left video thoracoscopy - Wedge resection of the left lower lobe - Intercostal nerve block   Surgeon and Role:      * Lightfoot, Eliezer Lofts, MD - Primary    Webb Laws, PA-C - assisting Anesthesia  general EBL:  Blood Administration: none Specimen:  left lower lobe wedge.  Level 9 lymph node.   Drains: 92 F argyle chest tube in left chest Counts: correct     Indications: 47 year old female with left lower lobe nodule. It measures 10 mm on recent PET/CT with an SUV uptake of 2.3. There is no increased hilar and mediastinal lymphadenopathy. She is a lifelong non-smoker but has had exposure to secondhand smoke. She is concerned that this may be a malignancy. We discussed the risks and benefits of combination procedure with Dr. Tonia Brooms which will include a navigational bronchoscopy with biopsy and marking followed by a left robotic assisted thoracoscopy with left lower lobe wedge resection and possible lobectomy. Following recovery from this she will undergo spinal stimulator implantation.    Findings: 2 pulm nodules removed in 1 wedge resection.  Neither were consisted with carcinoma on frozen  Referring: [name], NP Primary Care: Odis Luster, PA-C Primary Cardiologist: None   History of  Present Illness:   At time of CT surgical consultation Sheila Mathews 47 y.o. female presents for surgical evaluation of a 1 cm left lower lobe pulmonary nodule that was found incidentally.  She is a lifelong non-smoker but was exposed to secondhand smoke most of her life until the age of 19.  She does have history of multiple back surgeries chronic back pain.  She was scheduled to undergo spinal stimulator implantation.  Assessment: 47 year old female with left lower lobe nodule. It measures 10 mm on recent PET/CT with an SUV uptake of 2.3. There is no increased hilar and mediastinal lymphadenopathy. She is a lifelong non-smoker but has had exposure to secondhand smoke. She is concerned that this may be a malignancy. We discussed the risks and benefits of combination procedure with Dr. Tonia Brooms which will include a navigational bronchoscopy with biopsy and marking followed by a left robotic assisted thoracoscopy with left lower lobe wedge resection and possible lobectomy. Following recovery from this she will undergo spinal stimulator implantation.    Hospital Course:  The patient was admitted electively and on 12/30/2022 taken to the operating room at which time she underwent separate procedures including navigational bronchoscopy with biopsy and marking by Dr. Tonia Brooms followed by left robotic assisted video-assisted thoracoscopy for left lower lobe wedge resection and lymph node sampling.  She tolerated the procedure well.  Initial frozen section pathology showed no evidence of definite malignancy but some changes possibly consistent with lymphoma or chronic inflammatory process.  She was taken to the postanesthesia care unit in stable condition and later transferred to [facility] progressive care.  Her respiratory status remained stable and pain control adequate.  She was mobilized and diet advanced without difficulty.  There is no airleak on postop day 1.  The chest tube was clamped and chest x-ray repeated about 4   hours later and again showed no significant pneumothorax.  The chest tube was removed routinely.  She was ready for discharge to home.    Latest Vital Signs: Blood pressure 96/60, pulse (!) 52, temperature 98.6 F (37 C), temperature source Oral, resp. rate 14, height 5\' 3"  (1.6 m), weight 93.1 kg, SpO2 97 %.  Physical Exam: Cardiovascular: RRR Pulmonary: Clear to auscultation bilaterally Abdomen: Soft, non tender, bowel sounds present. Extremities: No lower extremity edema. Wounds: Clean and dry.  No erythema or signs of infection.  Discharge Condition: Stable  Recent laboratory studies:  Lab Results  Component Value Date   WBC 16.2 (H) 12/31/2022   HGB 10.1 (L) 12/31/2022   HCT 31.9 (L) 12/31/2022   MCV 91.4 12/31/2022   PLT 310 12/31/2022   Lab Results  Component Value Date   NA 138 12/31/2022   K 4.3 12/31/2022   CL 105 12/31/2022   CO2 26 12/31/2022   CREATININE 0.66 12/31/2022   GLUCOSE 114 (H) 12/31/2022      Diagnostic Studies: DG Chest 2 View  Result Date: 12/31/2022 CLINICAL DATA:  Preoperative respiratory examination. Left lower lobe pulmonary nodule. EXAM: CHEST - 2 VIEW COMPARISON:  Radiographs 05/13/2022, CT 10/24/2022 and PET-CT 11/20/2022. FINDINGS: The heart size and mediastinal contours are stable. The known left lower lobe nodule is not well seen radiographically. The lungs appear clear. There is no pleural effusion or pneumothorax. No acute osseous findings are demonstrated. IMPRESSION: No evidence of acute cardiopulmonary process. Known left lower lobe nodule is not well seen radiographically. Electronically Signed   By: Carey Bullocks M.D.   On: 12/31/2022 09:50   DG Chest Port 1 View  Result Date: 12/31/2022 CLINICAL DATA:  Postop thoracotomy for pulmonary nodule and lymph node dissection. EXAM: PORTABLE CHEST 1 VIEW COMPARISON:  Radiographs 12/30/2022 and 12/26/2022.  CT 10/24/2022. FINDINGS: 0515 hours. Left chest tube is unchanged in position. The  heart size and mediastinal contours are stable. No residual pneumothorax identified. There is improved aeration of both lung bases. No significant pleural effusion. Soft tissue emphysema noted in the lower left neck. The bones appear unremarkable. Telemetry leads overlie the chest. IMPRESSION: Improved aeration of both lung bases. No residual pneumothorax identified. Left chest tube unchanged in position. Electronically Signed   By: Carey Bullocks M.D.   On: 12/31/2022 09:48   DG Chest Port 1 View  Result Date: 12/30/2022 CLINICAL DATA:  Postop pulmonary nodule. EXAM: PORTABLE CHEST 1 VIEW COMPARISON:  12/26/2022 FINDINGS: Low volume film. Left chest tube noted with small apical left-sided pneumothorax. There is left base atelectasis. Telemetry leads overlie the chest. IMPRESSION: 1. Small left apical pneumothorax with left chest tube in place. 2. Left base atelectasis. Electronically Signed   By: Kennith Center M.D.   On: 12/30/2022 15:41   DG C-ARM BRONCHOSCOPY  Result Date: 12/30/2022 C-ARM BRONCHOSCOPY: Fluoroscopy was utilized by the requesting physician.  No radiographic interpretation.         Discharge Medications: Allergies as of 12/31/2022       Reactions   Almond (diagnostic) Anaphylaxis   Almond Oil Anaphylaxis, Other (See Comments)   Codeine Other (See  Comments)   Makes patient aggressive    Hydrocodone Other (See Comments)   Makes patient aggressive    Fluticasone Furoate-vilanterol Hives   Pt is currently using this inhaler, pt is awaiting allergy testing for further clarification        Medication List     TAKE these medications    acetaminophen 325 MG tablet Commonly known as: TYLENOL Take 2 tablets (650 mg total) by mouth every 4 (four) hours as needed for mild pain or moderate pain.   albuterol 108 (90 Base) MCG/ACT inhaler Commonly known as: VENTOLIN HFA Inhale 2 puffs into the lungs every 6 (six) hours as needed for wheezing or shortness of breath.   Breo  Ellipta 100-25 MCG/ACT Aepb Generic drug: fluticasone furoate-vilanterol Inhale 1 puff into the lungs daily.   cetirizine 10 MG tablet Commonly known as: ZYRTEC Take 10 mg by mouth daily.   clonazePAM 0.5 MG tablet Commonly known as: KLONOPIN Take 0.5 mg by mouth 3 (three) times daily as needed for anxiety.   Emgality 120 MG/ML Soaj Generic drug: Galcanezumab-gnlm Inject 120 mg into the skin every 28 (twenty-eight) days.   esomeprazole 20 MG capsule Commonly known as: NEXIUM Take 20 mg by mouth daily at 12 noon.   ferrous sulfate 325 (65 FE) MG tablet Take 325 mg by mouth daily with breakfast.   fluticasone 50 MCG/ACT nasal spray Commonly known as: FLONASE Place 2 sprays into both nostrils daily.   gabapentin 300 MG capsule Commonly known as: NEURONTIN Take 300 mg by mouth 3 (three) times daily.   hydrocortisone cream 1 % Apply 1 Application topically as needed for itching.   lamoTRIgine 25 MG tablet Commonly known as: LAMICTAL Take 150 mg by mouth at bedtime.   levothyroxine 25 MCG tablet Commonly known as: SYNTHROID 1 tab daily p.o. on an empty stomach x6 days a week, 2 tabs p.o. 1 day a week.   lithium carbonate 300 MG ER tablet Commonly known as: LITHOBID Take 900 mg by mouth every evening.   Mirena (52 MG) 20 MCG/DAY Iud Generic drug: levonorgestrel Mirena 21 mcg/24 hours (8 yrs) 52 mg intrauterine device  Take 1 device by intrauterine route.   oxyCODONE 5 MG immediate release tablet Commonly known as: Oxy IR/ROXICODONE Take 1 tablet (5 mg total) by mouth every 6 (six) hours as needed for up to 7 days for moderate pain.   sertraline 100 MG tablet Commonly known as: ZOLOFT Take 150 mg by mouth daily.   SUMAtriptan 100 MG tablet Commonly known as: IMITREX Take 1 tablet earliest onset of migraine.  May repeat in 2 hours. Maximum 2 tablets in 24 hours.   traZODone 100 MG tablet Commonly known as: DESYREL Take 100 mg by mouth at bedtime.         Follow Up Appointments:  Follow-up Information     [facility] Follow up.   Why: On the date you are scheduled to see Dr. Cliffton Asters obtain a chest x-ray at [facility] 1 hour prior to seeing him. Contact information: [address] [place] [address]        Corliss Skains, MD. Go on 01/17/2023.   Specialty: Cardiothoracic Surgery Why: Your appointment with Dr. Cliffton Asters is at 10:50am. Please obtain a chest x-ray 1 hur before the appointment at [facility] located at [address] Contact information: [address] [place] [address] [phone]  Signed: Leary Roca, PA-C 12/31/2022, 2:52 PM

## 2022-12-30 NOTE — Op Note (Signed)
      [address]       [name] [address]             [phone]        12/30/2022  Patient:  Sheila Mathews Pre-Op Dx: Left lower lobe pulmonary nodule   Post-op Dx:  same Procedure: - Robotic assisted left video thoracoscopy - Wedge resection of the left lower lobe - Intercostal nerve block  Surgeon and Role:      * Brian Kocourek, Eliezer Lofts, MD - Primary    Webb Laws, PA-C - assisting Anesthesia  general EBL:  Blood Administration: none Specimen:  left lower lobe wedge.  Level 9 lymph node.  Drains: 62 F argyle chest tube in left chest Counts: correct   Indications: 47 year old female with left lower lobe nodule. It measures 10 mm on recent PET/CT with an SUV uptake of 2.3. There is no increased hilar and mediastinal lymphadenopathy. She is a lifelong non-smoker but has had exposure to secondhand smoke. She is concerned that this may be a malignancy. We discussed the risks and benefits of combination procedure with Dr. Tonia Brooms which will include a navigational bronchoscopy with biopsy and marking followed by a left robotic assisted thoracoscopy with left lower lobe wedge resection and possible lobectomy. Following recovery from this she will undergo spinal stimulator implantation.   Findings: 2 pulm nodules removed in 1 wedge resection.  Neither were consisted with carcinoma on frozen  Operative Technique: After the risks, benefits and alternatives were thoroughly discussed, the patient was brought to the operative theatre.  Anesthesia was induced, and the patient was then placed in a right lateral decubitus position and was prepped and draped in normal sterile fashion.  An appropriate surgical pause was performed, and pre-operative antibiotics were dosed accordingly.  We began by placing our 4 robotic ports in the 7th intercostal space targeting the hilum of the lung.  A 12mm assistant port was placed in the 9th intercostal space in the anterior axillary  line.  The robot was then docked and all instruments were passed under direct visualization.    The lung was then retracted superiorly, and the inferior pulmonary ligament was divided.  The lung was freed of all pleural adhesions.  Wedge resections of the left lower was performed.  It was then placed in an endocatch bag and removed from the anterior port.  An intercostal nerve block was performed under direct visualization.  A mechanical pleurodesis was then performed.  A 19F chest with then placed, and we watch the remaining lobes re-expand.  The skin and soft tissue were closed with absorbable suture.    The patient tolerated the procedure without any immediate complications, and was transferred to the PACU in stable condition.  Andrez Lieurance Keane Scrape

## 2022-12-30 NOTE — Progress Notes (Signed)
Patient ID: Sheila Mathews, female   DOB: December 06, 1975, 47 y.o.   MRN: [id number]  TCTS Evening Rounds:  Hemodynamically stable. Sats 96% on RA.  Eating dinner.  CT output low  UO good.

## 2022-12-30 NOTE — Hospital Course (Signed)
referring: Sheila Ngo, NP Primary Care: Odis Luster, PA-C Primary Cardiologist: None   History of Present Illness:   At time of CT surgical consultation Sheila Mathews 47 y.o. female presents for surgical evaluation of a 1 cm left lower lobe pulmonary nodule that was found incidentally.  She is a lifelong non-smoker but was exposed to secondhand smoke most of her life until the age of 13.  She does have history of multiple back surgeries chronic back pain.  She was scheduled to undergo spinal stimulator implantation.  Assessment: 47 year old female with left lower lobe nodule. It measures 10 mm on recent PET/CT with an SUV uptake of 2.3. There is no increased hilar and mediastinal lymphadenopathy. She is a lifelong non-smoker but has had exposure to secondhand smoke. She is concerned that this may be a malignancy. We discussed the risks and benefits of combination procedure with Dr. Tonia Brooms which will include a navigational bronchoscopy with biopsy and marking followed by a left robotic assisted thoracoscopy with left lower lobe wedge resection and possible lobectomy. Following recovery from this she will undergo spinal stimulator implantation.    Hospital course:  The patient was admitted electively and on 12/30/2022 taken to the operating room at which time she underwent separate procedures including navigational bronchoscopy with biopsy and marking by Dr. Tonia Brooms followed by left robotic assisted video-assisted thoracoscopy for left lower lobe wedge resection and lymph node sampling.  She tolerated the procedure well.  Initial frozen section pathology showed no evidence of definite malignancy but some changes possibly consistent with lymphoma or chronic inflammatory process.  She was taken to the postanesthesia care unit in stable condition.

## 2022-12-30 NOTE — Brief Op Note (Signed)
12/30/2022  8:23 AM  PATIENT:  Sheila Mathews  47 y.o. female  PRE-OPERATIVE DIAGNOSIS:  LLL PULMONARY NODULE  POST-OPERATIVE DIAGNOSIS:  LEFT LOWER LOBE PULMONARY NODULE  PROCEDURE:  Procedure(s): XI ROBOTIC ASSISTED THORACOSCOPY-LEFT LOWER LOBE WEDGE RESECTION (Left) LYMPH NODE DISSECTION (Left)  SURGEON:  Surgeon(s) and Role:    * Lightfoot, Eliezer Lofts, MD - Primary  PHYSICIAN ASSISTANT: Ab Leaming PA-C  ASSISTANTS: none   ANESTHESIA:   general  EBL:  50 mL   BLOOD ADMINISTERED:none  DRAINS:  68F Chest Tube(s) in the LEFT HEMITHORAX    LOCAL MEDICATIONS USED:  BUPIVICAINE  and OTHER EXPAREL  SPECIMEN:  Source of Specimen:  Left lower lobe wedge resection and lymph node sample  DISPOSITION OF SPECIMEN:  PATHOLOGY  COUNTS:  YES  TOURNIQUET:  * No tourniquets in log *  DICTATION: .Dragon Dictation  PLAN OF CARE: Admit to inpatient   PATIENT DISPOSITION:  PACU - hemodynamically stable.   Delay start of Pharmacological VTE agent (>24hrs) due to surgical blood loss or risk of bleeding: yes  Complications: No known

## 2022-12-30 NOTE — Anesthesia Procedure Notes (Signed)
Procedure Name: Intubation Date/Time: 12/30/2022 7:48 AM  Performed by: Drema Pry, CRNAPre-anesthesia Checklist: Patient identified, Emergency Drugs available, Suction available and Patient being monitored Patient Re-evaluated:Patient Re-evaluated prior to induction Oxygen Delivery Method: Circle System Utilized Preoxygenation: Pre-oxygenation with 100% oxygen Induction Type: IV induction Ventilation: Mask ventilation without difficulty Laryngoscope Size: Mac and 3 Grade View: Grade I Tube type: Oral Tube size: 8.5 mm Number of attempts: 1 Airway Equipment and Method: Stylet Placement Confirmation: ETT inserted through vocal cords under direct vision, positive ETCO2 and breath sounds checked- equal and bilateral Secured at: 22 cm Tube secured with: Tape Dental Injury: Teeth and Oropharynx as per pre-operative assessment  Comments: Earleen Reaper, [place]

## 2022-12-30 NOTE — H&P (Signed)
Synopsis:  Subjective:   PATIENT ID: Sheila Mathews GENDER: female DOB: October 02, 1975, MRN: [id number]  No chief complaint on file.   Sheila Mathews is a 47 year old female, incidentally found left lower lobe pulmonary nodule measuring 9 mm in size this was back in October 2023. Patient also has some shortness of breath and occasional chest tightness., ? Asthma. She has significant secondhand smoke exposure related to family. She was referred to see Dr. Tonia Brooms 08/2022     11/26/2022 Pt. Presents for follow up after PET scan. 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 Plan will be for a possible combo case with Dr. Tonia Brooms and Dr. Cliffton Asters.  Patient will need PFT's first to ensure she is a surgical candidate. She is in agreement with this plan. She is very anxious and wants to know what this nodule is. She has surgery scheduled for  12/12/2022 for back stimulator insertion. She will need 4 weeks to recover  . We will refer to surgery for evaluation, and get PFT's to ensure she is a surgical candidate.  I have no reason to believe she will not be a surgical candidate as she is a never smoker. Plan will be for bronchoscopy and possible combination case , singular anesthetic event surgery after she recovers from her back stimulator insertion. Patient is in agreement with the above plan.   Patient was given Dulera at last visit as she had developed hives around the time of initiating Breo for her asthma.  She states she does not like the [facility] as much as she liked the [facility] and she is still having hives so she no longer feels it is related to her inhaler.   Patient states she would prefer to restart the [facility].  I have told her this is fine, however have referred her to allergy for evaluation.  Additionally I have prescribed her an EpiPen to keep with her at all times.   Test  Results: PET scan 11/20/2022 Indeterminate hypermetabolism but raising concern for small primary bronchogenic carcinoma.   10/24/2022 CT Chest Left lower lobe pulmonary nodule measures 10 x 9 mm, previously 9 x 8 mm on chest CT and 11 x 9 mm on abdominal CT. Margins are microlobulated. While morphology is suspicious for neoplasm, stability over the course of 9 months is reassuring. Recommend either 6 month chest CT follow-up or PET characterization. 2. An area of subpleural ground-glass in the dependent left lower lobe is slightly smaller than on prior exams. This may represent an area of scarring.  12/30/2022: here today for planned RAB + RATS. No complaints. No issues.      Past Medical History:  Diagnosis Date   Abnormal cervical Papanicolaou smear 04/20/2020   Allergies    Anemia    with pregnancy , resolved after delivery   Anxiety    Bipolar disorder (HCC)    Chicken pox    Chronic sinusitis 07/26/2016   no current problems as of 12/05/22   Complication of anesthesia    pt doesn't like mask she has panic attack; childhood   Depression    Dyspnea    with intermittent chest tightness at times, has used daughter's albuterol inhaler which has helped per patient 12/05/22   Family history of adverse reaction to anesthesia    Mother PONV   GERD (gastroesophageal reflux disease)    Hypothyroidism    Migraines  Nonsmoker    Pelvic pain in female 05/13/2014   resolved, no current problem as of 12/05/22   Pneumonia    x 1   PONV (postoperative nausea and vomiting)    no issues with short procdures, had one isntance of vomitus following a longer procedure    Sciatica    Thyroid disease      Family History  Problem Relation Age of Onset   Depression Mother    Mental illness Mother    Diabetes Brother    Drug abuse Brother    Early death Brother    Mental illness Brother    Breast cancer Maternal Grandmother 30   Cancer Maternal Grandfather        head neck    Diabetes  Maternal Grandfather    Lung cancer Maternal Grandfather    Cancer Paternal Grandmother        gallbladder   Colon cancer Paternal Grandfather 21   Diabetes Paternal Grandfather    Breast cancer Maternal Aunt 12     Past Surgical History:  Procedure Laterality Date   ABDOMINAL EXPOSURE N/A 09/21/2020   Procedure: LATERAL EXPOSURE FOR OBLIQUE LATERAL SPINE SURGERY;  Surgeon: Cephus Shelling, MD;  Location: [facility] OR;  Service: Vascular;  Laterality: N/A;   ANTERIOR LATERAL LUMBAR FUSION WITH PERCUTANEOUS SCREW 1 LEVEL N/A 09/21/2020   Procedure: OBLIQUE LUMBAR INTERBODY FUSION LUMBAR FOUR THROUGH FIVE, POSTERIOR SPINAL FUSION INSTRUMENTATION LUMAR FOUR THROUGH FIVE;  Surgeon: Venita Lick, MD;  Location: [facility] OR;  Service: Orthopedics;  Laterality: N/A;   BACK SURGERY     CESAREAN SECTION  x2   2000, 2005   COLONOSCOPY     03/2022   LAPAROSCOPY N/A 05/13/2014   Procedure: LAPAROSCOPY DIAGNOSTIC;  Surgeon: Lavina Hamman, MD;  Location: [facility] ORS;  Service: Gynecology;  Laterality: N/A;   LUMBAR DISC SURGERY  2023   SI joint   LUMBAR LAMINECTOMY/DECOMPRESSION MICRODISCECTOMY Left 02/12/2017   Procedure: Microlumbar decompression L5-S1 left ;  Surgeon: Jene Every, MD;  Location: [facility] ORS;  Service: Orthopedics;  Laterality: Left;  90 mins   SALIVARY GLAND SURGERY  1986   SPINAL CORD STIMULATOR REMOVAL  09/2022   UPPER GI ENDOSCOPY     WISDOM TOOTH EXTRACTION      Social History   Socioeconomic History   Marital status: Married    Spouse name: Not on file   Number of children: 3   Years of education: Not on file   Highest education level: Bachelor's degree (e.g., BA, AB, BS)  Occupational History   Not on file  Tobacco Use   Smoking status: Never   Smokeless tobacco: Never  Vaping Use   Vaping Use: Never used  Substance and Sexual Activity   Alcohol use: Yes    Comment: social   Drug use: Not Currently    Types: Marijuana    Comment: informed do not use 24 hrs prior to  surgery 12/05/22 at PAT appt   Sexual activity: Yes    Partners: Male    Birth control/protection: I.U.D.    Comment: Mirena IUD  Other Topics Concern   Not on file  Social History Narrative   Marital status/children/pets: Married, 3 children.    Education/employment: Oncologist, healthcare advocate   Safety:      -smoke alarm in the home:Yes     - wears seatbelt: Yes     - Feels safe in their relationships: Yes   Right handed   No caffeine  One story home   Social Determinants of Health   Financial Resource Strain: Not on file  Food Insecurity: Not on file  Transportation Needs: Not on file  Physical Activity: Not on file  Stress: Not on file  Social Connections: Not on file  Intimate Partner Violence: Not on file     Allergies  Allergen Reactions   Almond (Diagnostic) Anaphylaxis   Almond Oil Anaphylaxis and Other (See Comments)   Codeine Other (See Comments)    Makes patient aggressive     Hydrocodone Other (See Comments)    Makes patient aggressive    Fluticasone Furoate-Vilanterol Hives    Pt is currently using this inhaler, pt is awaiting allergy testing for further clarification     @ENCMEDSTART @  Review of Systems  Constitutional:  Negative for chills, fever, malaise/fatigue and weight loss.  HENT:  Negative for hearing loss, sore throat and tinnitus.   Eyes:  Negative for blurred vision and double vision.  Respiratory:  Negative for cough, hemoptysis, sputum production, shortness of breath, wheezing and stridor.   Cardiovascular:  Negative for chest pain, palpitations, orthopnea, leg swelling and PND.  Gastrointestinal:  Negative for abdominal pain, constipation, diarrhea, heartburn, nausea and vomiting.  Genitourinary:  Negative for dysuria, hematuria and urgency.  Musculoskeletal:  Negative for joint pain and myalgias.  Skin:  Negative for itching and rash.  Neurological:  Negative for dizziness, tingling, weakness and headaches.   Endo/Heme/Allergies:  Negative for environmental allergies. Does not bruise/bleed easily.  Psychiatric/Behavioral:  Negative for depression. The patient is not nervous/anxious and does not have insomnia.   All other systems reviewed and are negative.    Objective:  Physical Exam Vitals reviewed.  Constitutional:      General: She is not in acute distress.    Appearance: She is well-developed.  HENT:     Head: Normocephalic and atraumatic.  Eyes:     General: No scleral icterus.    Conjunctiva/sclera: Conjunctivae normal.     Pupils: Pupils are equal, round, and reactive to light.  Neck:     Vascular: No JVD.     Trachea: No tracheal deviation.  Cardiovascular:     Rate and Rhythm: Normal rate and regular rhythm.     Heart sounds: Normal heart sounds. No murmur heard. Pulmonary:     Effort: Pulmonary effort is normal. No tachypnea, accessory muscle usage or respiratory distress.     Breath sounds: Normal breath sounds. No stridor. No wheezing, rhonchi or rales.  Abdominal:     General: Bowel sounds are normal. There is no distension.     Palpations: Abdomen is soft.     Tenderness: There is no abdominal tenderness.  Musculoskeletal:        General: No tenderness.     Cervical back: Neck supple.  Lymphadenopathy:     Cervical: No cervical adenopathy.  Skin:    General: Skin is warm and dry.     Capillary Refill: Capillary refill takes less than 2 seconds.     Findings: No rash.  Neurological:     Mental Status: She is alert and oriented to person, place, and time.  Psychiatric:        Behavior: Behavior normal.      Vitals:   12/30/22 0549  BP: 94/61  Pulse: 71  Resp: 18  Temp: 98.4 F (36.9 C)  TempSrc: Oral  SpO2: 94%  Weight: 93.1 kg  Height: 5\' 3"  (1.6 m)   94% on RA BMI Readings from  Last 3 Encounters:  12/30/22 36.36 kg/m  12/26/22 36.37 kg/m  12/06/22 37.02 kg/m   Wt Readings from Last 3 Encounters:  12/30/22 93.1 kg  12/26/22 93.1 kg   12/06/22 94.8 kg     CBC    Component Value Date/Time   WBC 11.6 (H) 12/26/2022 1530   RBC 4.56 12/26/2022 1530   HGB 12.6 12/26/2022 1530   HCT 40.7 12/26/2022 1530   PLT 349 12/26/2022 1530   MCV 89.3 12/26/2022 1530   MCH 27.6 12/26/2022 1530   MCHC 31.0 12/26/2022 1530   RDW 15.6 (H) 12/26/2022 1530   LYMPHSABS 1.2 04/08/2022 1206   MONOABS 0.4 04/08/2022 1206   EOSABS 0.0 04/08/2022 1206   BASOSABS 0.0 04/08/2022 1206     Chest Imaging:   Pulmonary Functions Testing Results:    Latest Ref Rng & Units 12/02/2022    2:04 PM  PFT Results  FVC-Pre L 3.95   FVC-Predicted Pre % 112   FVC-Post L 4.00   FVC-Predicted Post % 113   Pre FEV1/FVC % % 77   Post FEV1/FCV % % 80   FEV1-Pre L 3.06   FEV1-Predicted Pre % 107   FEV1-Post L 3.21   DLCO uncorrected ml/min/mmHg 17.96   DLCO UNC% % 86   DLCO corrected ml/min/mmHg 18.37   DLCO COR %Predicted % 88   DLVA Predicted % 82   TLC L 5.70   TLC % Predicted % 116   RV % Predicted % 101     FeNO:   Pathology:   Echocardiogram:   Heart Catheterization:     Assessment & Plan:     ICD-10-CM   1. Lung nodule  R91.1 Informed Consent Details: Physician/Practitioner Attestation; Transcribe to consent form and obtain patient signature    Initiate Pre-op Protocol    Diet NPO time specified    Pre-admission testing diagnosis    Verify informed consent    Verify: history and physical is on the chart    Verify: blood consent signed    Verify: type & screen is active for day of surgery or if previously ordered blood products prepared in blood bank    Patient Education: Use anesthesia standing orders to instruct patient on medications to take pre-op    Patient Education: Incentive Spirometry instructions to VATS patients    Lab instructions    Confirm: 12 lead EKG completed withing one month of surgery. If not current obtain per standing orders    Confirm: PA and Lateral CXR completed within previous 72 hours.  Films  obtained on Friday are acceptable for Monday and Tuesday cases. If not current obtain per standing orders    Pneumatic SCD boots to accompany all patients to O.R.    Prep / Clip    ceFAZolin (ANCEF) IVPB 2g/100 mL premix    Informed Consent Details: Physician/Practitioner Attestation; Transcribe to consent form and obtain patient signature    Informed Consent Details: Physician/Practitioner Attestation; Transcribe to consent form and obtain patient signature    Initiate Pre-op Protocol    Pre-admission testing diagnosis    Verify informed consent    Verify: history and physical is on the chart    Verify: blood consent signed    Verify: type & screen is active for day of surgery or if previously ordered blood products prepared in blood bank    Patient Education: Use anesthesia standing orders to instruct patient on medications to take pre-op    Patient Education: Incentive Spirometry instructions to  VATS patients    Lab instructions    Confirm: 12 lead EKG completed withing one month of surgery. If not current obtain per standing orders    Confirm: PA and Lateral CXR completed within previous 72 hours.  Films obtained on Friday are acceptable for Monday and Tuesday cases. If not current obtain per standing orders    Pneumatic SCD boots to accompany all patients to O.R.    Prep / Clip    Informed Consent Details: Physician/Practitioner Attestation; Transcribe to consent form and obtain patient signature      Discussion:  Lung nodule  P: Here today for planned combination procedure.  RAB with fiducial dye marking followed by RATs with Dr. Cliffton Asters.    Josephine Igo, DO [facility] 12/30/2022 7:14 AM

## 2022-12-30 NOTE — Op Note (Addendum)
Video Bronchoscopy with Robotic Assisted Bronchoscopic Navigation   Date of Operation: 12/30/2022   Pre-op Diagnosis: Left lower lobe pulmonary nodule  Post-op Diagnosis: Left lower lobe pulmonary nodule  Surgeon: Josephine Igo, DO   Assistants: None   Anesthesia: General endotracheal anesthesia  Operation: Flexible video fiberoptic bronchoscopy with robotic assistance and biopsies.  Estimated Blood Loss: Minimal  Complications: None  Indications and History: Sheila Mathews is a 47 y.o. female with history of left lower lobe pulmonary nodule. The risks, benefits, complications, treatment options and expected outcomes were discussed with the patient.  The possibilities of pneumothorax, pneumonia, reaction to medication, pulmonary aspiration, perforation of a viscus, bleeding, failure to diagnose a condition and creating a complication requiring transfusion or operation were discussed with the patient who freely signed the consent.    Description of Procedure: The patient was seen in the Preoperative Area, was examined and was deemed appropriate to proceed.  The patient was taken to [facility] endoscopy room 3, identified as [name] and the procedure verified as Flexible Video Fiberoptic Bronchoscopy.  A Time Out was held and the above information confirmed.   Prior to the date of the procedure a high-resolution CT scan of the chest was performed. Utilizing ION software program a virtual tracheobronchial tree was generated to allow the creation of distinct navigation pathways to the patient's parenchymal abnormalities. After being taken to the operating room general anesthesia was initiated and the patient  was orally intubated. The video fiberoptic bronchoscope was introduced via the endotracheal tube and a general inspection was performed which showed normal right and left lung anatomy, aspiration of the bilateral mainstems was completed to remove any remaining secretions. Robotic catheter  inserted into patient's endotracheal tube.   Target #1 left lower lobe pulmonary nodule: The distinct navigation pathways prepared prior to this procedure were then utilized to navigate to patient's lesion identified on CT scan. The robotic catheter was secured into place and the vision probe was withdrawn.  Lesion location was approximated using fluoroscopy and three-dimensional cone beam CT imaging for CT-guided needle placement and for peripheral targeting. Under fluoroscopic guidance, transbronchial needle biopsies, and transbronchial forceps biopsies were performed to be sent for cytology and pathology.  Following tissue specimen we completed fiducial dye marking using 50% / 50% mixture of ICG and methylene blue.  At the end of the procedure a general airway inspection was performed and there was no evidence of active bleeding. The bronchoscope was removed.  The patient tolerated the procedure well. There was no significant blood loss and there were no obvious complications. A post-procedural chest x-ray is pending.  Samples Target #1: 1. Transbronchial Wang needle biopsies from LLL 2. Transbronchial forceps biopsies from LLL  Plans:  Based on the results of the bronchoscopy decision was made for patient to remain asleep and transferred to the main operating room under the care of Dr. Cliffton Asters for a robotic assisted thoracic surgery.  With possible need for wedge resection and lobectomy.  Josephine Igo, DO [facility] 12/30/2022 8:48 AM

## 2022-12-30 NOTE — Anesthesia Procedure Notes (Signed)
Procedure Name: Intubation Date/Time: 12/30/2022 8:49 AM  Performed by: Drema Pry, CRNAPre-anesthesia Checklist: Patient identified, Emergency Drugs available, Suction available and Patient being monitored Patient Re-evaluated:Patient Re-evaluated prior to induction Oxygen Delivery Method: Circle System Utilized Preoxygenation: Pre-oxygenation with 100% oxygen Induction Type: IV induction Ventilation: Mask ventilation without difficulty Laryngoscope Size: Mac and 3 Grade View: Grade I Tube type: Oral Endobronchial tube: Left, Double lumen EBT, EBT position confirmed by auscultation and EBT position confirmed by fiberoptic bronchoscope and 35 Fr Number of attempts: 1 Airway Equipment and Method: Rigid stylet and Fiberoptic brochoscope Placement Confirmation: ETT inserted through vocal cords under direct vision, positive ETCO2 and breath sounds checked- equal and bilateral Tube secured with: Tape Dental Injury: Teeth and Oropharynx as per pre-operative assessment  Comments: Earleen Reaper, [place]

## 2022-12-30 NOTE — Interval H&P Note (Signed)
History and Physical Interval Note:  12/30/2022 7:30 AM  Sheila Mathews  has presented today for surgery, with the diagnosis of LLL PULMONARY NODULE.  The various methods of treatment have been discussed with the patient and family. After consideration of risks, benefits and other options for treatment, the patient has consented to  Procedure(s): XI ROBOTIC ASSISTED THORACOSCOPY-LEFT LOWER LOBE WEDGE RESECTION, possible lobectomy (Left) as a surgical intervention.  The patient's history has been reviewed, patient examined, no change in status, stable for surgery.  I have reviewed the patient's chart and labs.  Questions were answered to the patient's satisfaction.     Amberia Bayless Keane Scrape

## 2022-12-31 ENCOUNTER — Inpatient Hospital Stay (HOSPITAL_COMMUNITY): Payer: BC Managed Care – PPO

## 2022-12-31 ENCOUNTER — Ambulatory Visit: Payer: BC Managed Care – PPO | Admitting: Allergy

## 2022-12-31 ENCOUNTER — Encounter (HOSPITAL_COMMUNITY): Payer: Self-pay | Admitting: Thoracic Surgery (Cardiothoracic Vascular Surgery)

## 2022-12-31 LAB — BASIC METABOLIC PANEL
Anion gap: 7 (ref 5–15)
BUN: <5 mg/dL — ABNORMAL LOW (ref 6–20)
CO2: 26 mmol/L (ref 22–32)
Calcium: 8.6 mg/dL — ABNORMAL LOW (ref 8.9–10.3)
Chloride: 105 mmol/L (ref 98–111)
Creatinine, Ser: 0.66 mg/dL (ref 0.44–1.00)
GFR, Estimated: >60 mL/min (ref >60)
Glucose, Bld: 114 mg/dL — ABNORMAL HIGH (ref 70–99)
Potassium: 4.3 mmol/L (ref 3.5–5.1)
Sodium: 138 mmol/L (ref 135–145)

## 2022-12-31 LAB — CBC
HCT: 31.9 % — ABNORMAL LOW (ref 36.0–46.0)
Hemoglobin: 10.1 g/dL — ABNORMAL LOW (ref 12.0–15.0)
MCH: 28.9 pg (ref 26.0–34.0)
MCHC: 31.7 g/dL (ref 30.0–36.0)
MCV: 91.4 fL (ref 80.0–100.0)
Platelets: 310 10*3/uL (ref 150–400)
RBC: 3.49 MIL/uL — ABNORMAL LOW (ref 3.87–5.11)
RDW: 15.9 % — ABNORMAL HIGH (ref 11.5–15.5)
WBC: 16.2 10*3/uL — ABNORMAL HIGH (ref 4.0–10.5)
nRBC: 0 % (ref 0.0–0.2)

## 2022-12-31 MED ORDER — ACETAMINOPHEN 325 MG PO TABS: 650.0000 mg | ORAL_TABLET | ORAL | Status: DC | PRN

## 2022-12-31 MED ORDER — KETOROLAC TROMETHAMINE 15 MG/ML IJ SOLN
30.0000 mg | Freq: Four times a day (QID) | INTRAMUSCULAR | Status: DC
  Administered 2022-12-31: 30 mg via INTRAVENOUS
  Filled 2022-12-31: qty 2

## 2022-12-31 MED ORDER — OXYCODONE HCL 5 MG PO TABS: 5.0000 mg | ORAL_TABLET | Freq: Four times a day (QID) | ORAL | 0 refills | Status: AC | PRN

## 2022-12-31 NOTE — Discharge Instructions (Signed)

## 2022-12-31 NOTE — TOC Transition Note (Signed)
Transition of Care [facility]) - CM/SW Discharge Note   Patient Details  Name: Sheila Mathews MRN: [id number] Date of Birth: Nov 07, 1975  [facility] CM/SW Contact:  Leone Haven, RN Phone Number: 12/31/2022, 3:08 PM   Clinical Narrative:    For dc today, she has no needs. Spouse to transport her home.   Final next level of care: Home/Self Care Barriers to Discharge: No Barriers Identified   Patient Goals and CMS Choice   Choice offered to / list presented to : NA  Discharge Placement                         Discharge Plan and Services Additional resources added to the After Visit Summary for   In-house Referral: NA Discharge Planning Services: CM Consult Post Acute Care Choice: NA          DME Arranged: N/A DME Agency: NA       HH Arranged: NA          Social Determinants of Health (SDOH) Interventions SDOH Screenings   Food Insecurity: No Food Insecurity (12/30/2022)  Housing: Low Risk  (12/30/2022)  Transportation Needs: No Transportation Needs (12/30/2022)  Utilities: Not At Risk (12/30/2022)  Depression (PHQ2-9): Medium Risk (04/26/2022)  Tobacco Use: Low Risk  (12/31/2022)     Readmission Risk Interventions     No data to display

## 2022-12-31 NOTE — Progress Notes (Signed)
Mobility Specialist Progress Note:   12/31/22 1100  Mobility  Activity Ambulated with assistance in hallway  Level of Assistance Contact guard assist, steadying assist  Assistive Device None  Distance Ambulated (ft) 480 ft  Activity Response Tolerated well  Mobility Referral Yes  $Mobility charge 1 Mobility  Mobility Specialist Start Time (ACUTE ONLY) 0955  Mobility Specialist Stop Time (ACUTE ONLY) 1006  Mobility Specialist Time Calculation (min) (ACUTE ONLY) 11 min    Pre Mobility: 96 HR During Mobility: 117 HR Post Mobility:  77 HR  Pt received in bed, agreeable to mobility. Mod I for bed mobility and ambulation. Ambulated in hallway CG, w/ no AD. C/o pain at chest tube site and LUE. Otherwise asymptomatic. Pt left on EOB with call bell and family present.  D'Vante Earlene Plater Mobility Specialist Please contact via Special educational needs teacher or Rehab office at [phone]

## 2022-12-31 NOTE — TOC Initial Note (Signed)
Transition of Care [facility]) - Initial/Assessment Note    Patient Details  Name: Sheila Mathews MRN: [id number] Date of Birth: 1976-06-16  Transition of Care [facility]) CM/SW Contact:    Leone Haven, RN Phone Number: 12/31/2022, 3:07 PM  Clinical Narrative:                 From home with spouse, indep, she has PCP and insurance on file.  She currently does not have any HH services in place at this time.  She has no DME at home.  She states her spouse will transport her home. Spouse is her support system, [facility] [place] [facility].    Expected Discharge Plan: Home/Self Care Barriers to Discharge: No Barriers Identified   Patient Goals and CMS Choice Patient states their goals for this hospitalization and ongoing recovery are:: return home   Choice offered to / list presented to : NA      Expected Discharge Plan and Services In-house Referral: NA Discharge Planning Services: CM Consult Post Acute Care Choice: NA Living arrangements for the past 2 months: Single Family Home Expected Discharge Date: 12/31/22               DME Arranged: N/A DME Agency: NA       HH Arranged: NA          Prior Living Arrangements/Services Living arrangements for the past 2 months: Single Family Home Lives with:: Spouse Patient language and need for interpreter reviewed:: Yes Do you feel safe going back to the place where you live?: Yes      Need for Family Participation in Patient Care: Yes (Comment) Care giver support system in place?: Yes (comment)   Criminal Activity/Legal Involvement Pertinent to Current Situation/Hospitalization: No - Comment as needed  Activities of Daily Living Home Assistive Devices/Equipment: None ADL Screening (condition at time of admission) Patient's cognitive ability adequate to safely complete daily activities?: Yes Is the patient deaf or have difficulty hearing?: No Does the patient have difficulty seeing, even when wearing  glasses/contacts?: Yes Does the patient have difficulty concentrating, remembering, or making decisions?: No Patient able to express need for assistance with ADLs?: No Does the patient have difficulty dressing or bathing?: No Independently performs ADLs?: Yes (appropriate for developmental age) Does the patient have difficulty walking or climbing stairs?: No Weakness of Legs: None Weakness of Arms/Hands: None  Permission Sought/Granted                  Emotional Assessment   Attitude/Demeanor/Rapport: Engaged Affect (typically observed): Appropriate Orientation: : Oriented to Self, Oriented to Place, Oriented to  Time, Oriented to Situation Alcohol / Substance Use: Not Applicable Psych Involvement: No (comment)  Admission diagnosis:  Lung nodules [R91.8] Patient Active Problem List   Diagnosis Date Noted   Lung nodule 12/30/2022   Lung nodules 12/30/2022   Atypical pneumonia 04/26/2022   Vitamin D deficiency 03/14/2022   Low serum vitamin B12 03/14/2022   Iron deficiency 03/14/2022   S/P lumbar fusion 09/21/2020   Allergic rhinitis 09/14/2020   Anxiety 09/14/2020   Chronic fatigue syndrome 09/14/2020   Hyperlipidemia 09/14/2020   Obesity 09/14/2020   Dysphagia 09/08/2020   Globus sensation 09/08/2020   Irritable bowel syndrome 09/08/2020   Weight loss counseling, encounter for 06/05/2020   Acquired hypothyroidism 05/05/2020   Long-term current use of lithium 05/05/2020   Bipolar 1 disorder (HCC) 05/05/2020   PTSD (post-traumatic stress disorder) 05/05/2020   Obsessive-compulsive disorder 05/05/2020  Migraines    History of lumbar laminectomy 08/01/2017   Degeneration of lumbar intervertebral disc 08/01/2017   Acid reflux 07/10/2017   PCP:  Odis Luster, PA-C Pharmacy:   [phone] [place], [place] - [address] [place] [address] Phone: [phone] Fax:  [phone]     Social Determinants of Health (SDOH) Social History: SDOH Screenings   Food Insecurity: No Food Insecurity (12/30/2022)  Housing: Low Risk  (12/30/2022)  Transportation Needs: No Transportation Needs (12/30/2022)  Utilities: Not At Risk (12/30/2022)  Depression (PHQ2-9): Medium Risk (04/26/2022)  Tobacco Use: Low Risk  (12/31/2022)   SDOH Interventions:     Readmission Risk Interventions     No data to display

## 2022-12-31 NOTE — Progress Notes (Addendum)
      [address]       [name] [address]             [phone]       1 Day Post-Op Procedure(s) (LRB): XI ROBOTIC ASSISTED THORACOSCOPY-LEFT LOWER LOBE WEDGE RESECTION (Left) LYMPH NODE DISSECTION (Left)  Subjective: Patient resting this am. She has pain at chest tube/left shoulder areas. She is asking for regular food this am.  Objective: Vital signs in last 24 hours: Temp:  [98 F (36.7 C)-98.3 F (36.8 C)] 98.2 F (36.8 C) (07/02 0433) Pulse Rate:  [53-84] 57 (07/02 0600) Cardiac Rhythm: Sinus bradycardia (07/02 0704) Resp:  [10-26] 13 (07/02 0600) BP: (94-133)/(57-97) 104/65 (07/02 0600) SpO2:  [90 %-99 %] 99 % (07/02 0600)     Intake/Output from previous day: 07/01 0701 - 07/02 0700 In: 2600 [P.O.:1200; I.V.:1200; IV Piggyback:200] Out: 2220 [Urine:1870; Blood:50; Chest Tube:300]   Physical Exam:  Cardiovascular: RRR Pulmonary: Clear to auscultation bilaterally Abdomen: Soft, non tender, bowel sounds present. Extremities: No lower extremity edema. Wounds: Clean and dry.  No erythema or signs of infection. Chest Tube: to water seal, no air leak  Lab Results: CBC: Recent Labs    12/31/22 0020  WBC 16.2*  HGB 10.1*  HCT 31.9*  PLT 310   BMET:  Recent Labs    12/31/22 0020  NA 138  K 4.3  CL 105  CO2 26  GLUCOSE 114*  BUN <5*  CREATININE 0.66  CALCIUM 8.6*    PT/INR: No results for input(s): "LABPROT", "INR" in the last 72 hours. ABG:  INR: Will add last result for INR, ABG once components are confirmed Will add last 4 CBG results once components are confirmed  Assessment/Plan:  1. CV - SR 2.  Pulmonary - Chest tube with 300 cc since surgery. Chest tube is to water seal, no air leak. CXR this am appears stable (do not see small left apical pneumothorax as seen previously). Will discuss with Dr. Cliffton Asters, if should clamp tube, get CXR later today. Await final pathology. 3. Expected post op blood loss anemia-H and H  this am decreased to 10.1 and 31.9. Continue ferrous sulfate 4. Regarding pain control, on Toradol 15 mg every 6 hours, Gabapentin 300 mg tid (as taken prior to surgery), and Oxy PRN. Will increase Toradol to 30 mg for next several doses 5. History of hypothyroidism-continue Levothyroxine  Donielle M ZimmermanPA-C 12/31/2022,7:24 AM  Clamping trial Dispo planning  Selma Rodelo O Corie Allis

## 2023-01-01 LAB — CYTOLOGY - NON PAP

## 2023-01-02 ENCOUNTER — Encounter (HOSPITAL_COMMUNITY): Payer: Self-pay | Admitting: Pulmonary Disease

## 2023-01-08 LAB — SURGICAL PATHOLOGY

## 2023-01-09 NOTE — Progress Notes (Signed)
Virtual Visit via Video Note  Consent was obtained for video visit:  Yes.   Answered questions that patient had about telehealth interaction:  Yes.   I discussed the limitations, risks, security and privacy concerns of performing an evaluation and management service by telemedicine. I also discussed with the patient that there may be a patient responsible charge related to this service. The patient expressed understanding and agreed to proceed.  Pt location: Home Physician Location: office Name of referring provider:  Natalia Leatherwood, DO I connected with Sheila Mathews at patients initiation/request on 01/13/2023 at  1:10 PM EDT by video enabled telemedicine application and verified that I am speaking with the correct person using two identifiers. Pt MRN:  [id number] Pt DOB:  09-Jun-1976 Video Participants:  Sheila Mathews   Assessment/Plan:   Migraine with aura, without status migrainosus, intractable    Migraine prevention:  Emgality every 4 weeks Migraine rescue:  sumatriptan 100mg .   Limit use of pain relievers to no more than 2 days out of week to prevent risk of rebound or medication-overuse headache. Keep headache diary Follow up 6 months.  Subjective:  Sheila Mathews is a 47 year old right-handed woman history of migraines, panic attacks, anxiety and allergic rhinitis who follows up for migraines.   UPDATE: Started Emgality 2 months ago (took a while to get it approved). Tried Zavzpret.  Didn't like the way it made her feel.    Tremors stable.  On Lithium.  Thyroid function testing regularly monitored and has been well-controlled.  Intensity:  2-3/10 but may have 10/10 once in awhile. Duration:  1 to 2 hours with sumatriptan.   Frequency:  2 in last 4 weeks Current NSAIDS:  none Current analgesics:  none Current triptans:  Sumatriptan 100mg  Current ergotamine:  none Current anti-emetic:  none Current muscle relaxants:  Robaxin Current anti-anxiolytic:   Klonopin Current sleep aide:  none Current Antihypertensive medications:  none Current Antidepressant/antipsychotic/mood medications:  Sertraline 150mg , Lithium Current Anticonvulsant medications:  lamotrigine 100mg  daily Current anti-CGRP:  Emgality Current Vitamins/Herbal/Supplements:  none Current Antihistamines/Decongestants:  Flonase Other therapy:  none Hormone/birth control:  none   Caffeine:  1 cup of coffee per day Alcohol:  Occasionally Smoker:  No Diet:  8 healthy. Drinks a lot of water. Exercise:  Good Depression/stress:  Anxiety controlled Other pain:  Back pain - plan for surgery. Sleep hygiene:  Wakes up during the night and difficulty falling back asleep   HISTORY:  Onset:  47 years old Location:  Usually right-sided, retro-orbital, and moves to the top of the head Quality:  Usually pressure-like, sometimes stabbing Initial Intensity:  Usually 6-7/10, 10 out of 10 for severe Aura:  Sometimes preceded by fuzzy and tunnel vision - when closes eyes she sees flashes of light.  Prodrome:  No Associated symptoms:  First severe, experiences nausea, blurred vision, photophobia, phonophobia, and osmophobia. Initial Duration:  Severe attacks last up to 4 days. Otherwise 2-3 hours Initial Frequency:  Severe attacks occur once a month (4 days per month), otherwise other headaches occur 2 days per week. Total of 15-18 headache days per month. Triggers/exacerbating factors:  Menstrual cycle Relieving factors:  Phenergan/Toradol shots Activity:  Cannot function with severe attacks   For years, she had headaches occurring 2 days a week, lasting 2-3 hours, but had severe migraines once a month lasting 4 days.  In 2019, they started occurring only twice a month but still lasting 4 days..   Past NSAIDS:  Ketorolac,  naproxen Past analgesics:  Excedrin, Extra-strength Tylenol Past abortive triptans:  Relpax 40mg , Maxalt 10mg , sumatriptan injection Past abortive ergotamine:   none Past muscle relaxants:  Flexeril Past anti-emetic:  Zofran 4mg  Past antihypertensive medications:  No beta blockers as runs low HR Past antidepressant medications:  amitriptline 10mg  (side effects), citalopram Past anticonvulsant medications:  Depakote, topiramate, gabapentin (caused brain fog) Past anti-CGRP:  Aimovig (effective), Ubrelvy 100mg  (ineffective), Nurtec (rescue- ineffective) Other past therapies:  non     Family history of headache:  No  Past Medical History: Past Medical History:  Diagnosis Date   Abnormal cervical Papanicolaou smear 04/20/2020   Allergies    Anemia    with pregnancy , resolved after delivery   Anxiety    Bipolar disorder (HCC)    Chicken pox    Chronic sinusitis 07/26/2016   no current problems as of 12/05/22   Complication of anesthesia    pt doesn't like mask she has panic attack; childhood   Depression    Dyspnea    with intermittent chest tightness at times, has used daughter's albuterol inhaler which has helped per patient 12/05/22   Family history of adverse reaction to anesthesia    Mother PONV   GERD (gastroesophageal reflux disease)    Hypothyroidism    Migraines    Nonsmoker    Pelvic pain in female 05/13/2014   resolved, no current problem as of 12/05/22   Pneumonia    x 1   PONV (postoperative nausea and vomiting)    no issues with short procdures, had one isntance of vomitus following a longer procedure    Sciatica    Thyroid disease     Medications: Outpatient Encounter Medications as of 01/13/2023  Medication Sig   acetaminophen (TYLENOL) 325 MG tablet Take 2 tablets (650 mg total) by mouth every 4 (four) hours as needed for mild pain or moderate pain.   albuterol (VENTOLIN HFA) 108 (90 Base) MCG/ACT inhaler Inhale 2 puffs into the lungs every 6 (six) hours as needed for wheezing or shortness of breath.   BREO ELLIPTA 100-25 MCG/ACT AEPB Inhale 1 puff into the lungs daily.   cetirizine (ZYRTEC) 10 MG tablet Take 10 mg by  mouth daily.   clonazePAM (KLONOPIN) 0.5 MG tablet Take 0.5 mg by mouth 3 (three) times daily as needed for anxiety.   EMGALITY 120 MG/ML SOAJ Inject 120 mg into the skin every 28 (twenty-eight) days.   esomeprazole (NEXIUM) 20 MG capsule Take 20 mg by mouth daily at 12 noon.   ferrous sulfate 325 (65 FE) MG tablet Take 325 mg by mouth daily with breakfast.   fluticasone (FLONASE) 50 MCG/ACT nasal spray Place 2 sprays into both nostrils daily.   gabapentin (NEURONTIN) 300 MG capsule Take 300 mg by mouth 3 (three) times daily.   hydrocortisone cream 1 % Apply 1 Application topically as needed for itching.   lamoTRIgine (LAMICTAL) 25 MG tablet Take 150 mg by mouth at bedtime.   levonorgestrel (MIRENA, 52 MG,) 20 MCG/DAY IUD Mirena 21 mcg/24 hours (8 yrs) 52 mg intrauterine device  Take 1 device by intrauterine route.   levothyroxine (SYNTHROID) 25 MCG tablet 1 tab daily p.o. on an empty stomach x6 days a week, 2 tabs p.o. 1 day a week.   lithium carbonate (LITHOBID) 300 MG ER tablet Take 900 mg by mouth every evening.   sertraline (ZOLOFT) 100 MG tablet Take 150 mg by mouth daily.   SUMAtriptan (IMITREX) 100 MG tablet Take 1 tablet  earliest onset of migraine.  May repeat in 2 hours. Maximum 2 tablets in 24 hours.   traZODone (DESYREL) 100 MG tablet Take 100 mg by mouth at bedtime.   No facility-administered encounter medications on file as of 01/13/2023.    Allergies: Allergies  Allergen Reactions   Almond (Diagnostic) Anaphylaxis   Almond Oil Anaphylaxis and Other (See Comments)   Codeine Other (See Comments)    Makes patient aggressive     Hydrocodone Other (See Comments)    Makes patient aggressive    Fluticasone Furoate-Vilanterol Hives    Pt is currently using this inhaler, pt is awaiting allergy testing for further clarification    Family History: Family History  Problem Relation Age of Onset   Depression Mother    Mental illness Mother    Diabetes Brother    Drug abuse  Brother    Early death Brother    Mental illness Brother    Breast cancer Maternal Grandmother 30   Cancer Maternal Grandfather        head neck    Diabetes Maternal Grandfather    Lung cancer Maternal Grandfather    Cancer Paternal Grandmother        gallbladder   Colon cancer Paternal Grandfather 15   Diabetes Paternal Grandfather    Breast cancer Maternal Aunt 65    Observations/Objective:   No acute distress.  Alert and oriented.  Speech fluent and not dysarthric.  Language intact.  Eyes orthophoric on primary gaze.  Face symmetric.   Follow Up Instructions:    -I discussed the assessment and treatment plan with the patient. The patient was provided an opportunity to ask questions and all were answered. The patient agreed with the plan and demonstrated an understanding of the instructions.   The patient was advised to call back or seek an in-person evaluation if the symptoms worsen or if the condition fails to improve as anticipated.   Cira Servant, DO  CC:  Chilton Greathouse, PA-C

## 2023-01-10 ENCOUNTER — Ambulatory Visit: Payer: BC Managed Care – PPO | Admitting: Thoracic Surgery (Cardiothoracic Vascular Surgery)

## 2023-01-13 ENCOUNTER — Encounter: Payer: Self-pay | Admitting: Neurology

## 2023-01-13 ENCOUNTER — Telehealth (INDEPENDENT_AMBULATORY_CARE_PROVIDER_SITE_OTHER): Payer: BC Managed Care – PPO | Admitting: Neurology

## 2023-01-13 DIAGNOSIS — G250 Essential tremor: Secondary | ICD-10-CM

## 2023-01-13 DIAGNOSIS — G43109 Migraine with aura, not intractable, without status migrainosus: Secondary | ICD-10-CM | POA: Diagnosis not present

## 2023-01-13 MED ORDER — SUMATRIPTAN SUCCINATE 100 MG PO TABS: ORAL_TABLET | ORAL | 11 refills | Status: DC

## 2023-01-13 MED ORDER — EMGALITY 120 MG/ML ~~LOC~~ SOAJ: 120.0000 mg | SUBCUTANEOUS | 11 refills | Status: DC

## 2023-01-14 ENCOUNTER — Other Ambulatory Visit: Payer: Self-pay | Admitting: Thoracic Surgery (Cardiothoracic Vascular Surgery)

## 2023-01-14 DIAGNOSIS — R911 Solitary pulmonary nodule: Secondary | ICD-10-CM

## 2023-01-15 ENCOUNTER — Ambulatory Visit
Admission: RE | Admit: 2023-01-15 | Discharge: 2023-01-15 | Disposition: A | Discharge status: Home / Self Care | Payer: BC Managed Care – PPO | Source: Ambulatory Visit | Attending: Thoracic Surgery (Cardiothoracic Vascular Surgery) | Admitting: Thoracic Surgery (Cardiothoracic Vascular Surgery)

## 2023-01-15 ENCOUNTER — Ambulatory Visit (INDEPENDENT_AMBULATORY_CARE_PROVIDER_SITE_OTHER): Payer: Self-pay | Admitting: Thoracic Surgery (Cardiothoracic Vascular Surgery)

## 2023-01-15 VITALS — BP 113/72 | HR 88 | Resp 20 | Ht 63.0 in | Wt 204.0 lb

## 2023-01-15 DIAGNOSIS — Z09 Encounter for follow-up examination after completed treatment for conditions other than malignant neoplasm: Secondary | ICD-10-CM

## 2023-01-15 DIAGNOSIS — Z902 Acquired absence of lung [part of]: Secondary | ICD-10-CM | POA: Diagnosis not present

## 2023-01-15 DIAGNOSIS — R911 Solitary pulmonary nodule: Secondary | ICD-10-CM

## 2023-01-15 NOTE — Progress Notes (Signed)
      [address]       [place] [address]             [phone]        Sheila Mathews [facility] Medical Record #[id number] Date of Birth: 1976-03-09  Referring: Sheila Ngo, NP Primary Care: Sheila Luster, PA-C Primary Cardiologist:None  Reason for visit:   follow-up  History of Present Illness:     47 year old female presents for her first follow-up appointment.  She only complains of some numbness and tingling along her left arm and chest.  Otherwise she is doing well.  Physical Exam: BP 113/72   Pulse 88   Resp 20   Ht 5\' 3"  (1.6 m)   Wt 204 lb (92.5 kg)   SpO2 95% Comment: RA  BMI 36.14 kg/m   Alert NAD Incision clean.   Abdomen, ND No peripheral edema   Diagnostic Studies & Laboratory data:  Path: FINAL MICROSCOPIC DIAGNOSIS:   A. LUNG, LEFT LOWER LOBE, WEDGE RESECTION:  Bronchiolar adenoma with lymphoid stroma.  Negative for malignancy.  See comment.   B. LYMPH NODE, LEVEL 9, EXCISION:  One lymph node negative for metastatic carcinoma (0/1).     Assessment / Plan:   47 year old female status post wedge resection of the left lower lobe.  The pulmonary nodules consistent with lymphoid stroma, with no evidence of malignancy.  The original frozen section was concerning for pulmonary lymphoma however I do not see any specific tests for this.  I will refer her to Dr. Arbutus Mathews for further workup of this.  I will see her back in 1 month with a chest x-ray.  In regards to her arm pain I instructed her to double her gabapentin.  From a surgical standpoint for her back, she is cleared to undergo surgery at any point.   Sheila Mathews 01/15/2023 5:47 PM

## 2023-01-16 ENCOUNTER — Other Ambulatory Visit: Payer: Self-pay

## 2023-01-16 DIAGNOSIS — F3111 Bipolar disorder, current episode manic without psychotic features, mild: Secondary | ICD-10-CM | POA: Diagnosis not present

## 2023-01-16 DIAGNOSIS — F411 Generalized anxiety disorder: Secondary | ICD-10-CM | POA: Diagnosis not present

## 2023-01-16 NOTE — Progress Notes (Signed)
The proposed treatment discussed in conference is for discussion purpose only and is not a binding recommendation.  The patients have not been physically examined, or presented with their treatment options.  Therefore, final treatment plans cannot be decided.  

## 2023-01-17 ENCOUNTER — Ambulatory Visit: Payer: BC Managed Care – PPO | Admitting: Thoracic Surgery (Cardiothoracic Vascular Surgery)

## 2023-01-20 NOTE — Progress Notes (Unsigned)
New Patient Note  RE: Sheila Mathews MRN: [id number] DOB: 05-25-1976 Date of Office Visit: 01/21/2023  Consult requested by: [name], NP Primary care provider: Odis Luster, PA-C  Chief Complaint: hives  History of Present Illness: I had the pleasure of seeing Sheila Mathews for initial evaluation at the [facility] on 01/22/2023. She is a 47 y.o. female, who is referred here by Kandice Robinsons NP (pulm) for the evaluation of hives.  She is accompanied today by her husband who provided/contributed to the history.   Rash started in March 2024. She broke out on the top of her thighs and abdominal area. Describes them as itchy, red, raised. No ecchymosis upon resolution. Associated symptoms include: none.  Frequency of episodes: initially it was happening daily but now occurring once a week. Suspected triggers are unknown. Denies any fevers, chills, foods, personal care products or recent infections. She was started on Breo around this time but figured it wasn't that inhaler. Sometimes Lamictal makes her Sheila Mathews - she stopped with no change in rash.  She changed her laundry detergent to free and clear.   She has tried the following therapies: zyrtec with no benefit, benadryl with some benefit.  Systemic steroids: yes. Currently on no daily antihistamines as she ran out of zyrtec.  Previous work up includes: none. Previous history of rash/hives: no. Patient is up to date with the following cancer screening tests: physical exam scheduled for August, mammogram, pap smears.  Reviewed images on the phone - consistent with urticarial rash on the torso.   Patient follows with pulmonology for lung nodule which was benign. This was removed in July. She had this nodule for about 1 year.  Patient is scheduled to see oncology in August.   Assessment and Plan: Sheila Mathews is a 47 y.o. female with: Urticaria Pruritic rash started in March 2024. Initially daily and ow once per  week. No triggers noted. There was some concerns if Breo or Lamictal was a cause but they were not. Denies changes in diet, meds, personal care products. She took zyrtec and benadryl with minimal benefit. Concerned about allergic triggers. Patient also underwent lung nodule removal which was benign. Based on clinical history and pictures, she likely has chronic idiopathic urticaria. Discussed with patient, that urticaria is usually caused by release of histamine by cutaneous mast cells but sometimes it is non-histamine mediated. Explained that urticaria is not always associated with allergies. In most cases, the exact etiology for urticaria can not be established and it is considered idiopathic. Possible to have hive outbreak from physical stress on the body as well.  Start allegra (fexofenadine) 180 mg twice a day. If symptoms are not controlled or causes drowsiness let us know. Start Pepcid (famotidine) 20mg  twice a day.  Avoid the following potential triggers: alcohol, tight clothing, NSAIDs, hot showers and getting overheated. See below for proper skin care.  Get bloodwork. Make sure you keep your oncologist appointment.   ? asthma Follows with pulm and taking Breo daily due to shortness of breath. This is better since pulmonary removal. Continue meds as per pulm.  Other allergic rhinitis Symptomatic this spring. Was on AIT at [facility] allergy for 6 months and then stopped due to pruritus.  See below for environmental control measures for pollen. The antihistamines as above should also help.  Will make additional recommendations based on bloodwork results.  Other adverse food reactions, not elsewhere classified, subsequent encounter Almonds caused erythema, swelling, throat discomfort.  No prior work up. Continue to avoid almonds. For mild symptoms you can take over the counter antihistamines such as Benadryl 1-2 tablets = 25-50mg  and monitor symptoms closely. If symptoms worsen or if you  have severe symptoms including breathing issues, throat closure, significant swelling, whole body hives, severe diarrhea and vomiting, lightheadedness then inject epinephrine and seek immediate medical care afterwards. Emergency action plan given. Get almond IgE.  Other atopic dermatitis See below for proper skin care.   Return in about 6 weeks (around 03/04/2023).  Meds ordered this encounter  Medications   famotidine (PEPCID) 20 MG tablet    Sig: Take 1 tablet (20 mg total) by mouth 2 (two) times daily.    Dispense:  60 tablet    Refill:  3   Lab Orders         F020-[id number] Almond         ANA w/Reflex         Alpha-Gal Panel         Allergens w/Total IgE Area 2         C3 and C4         CBC with Differential/Platelet         Chronic Urticaria         C-reactive protein         Tryptase         Thyroid Cascade Profile         Sedimentation rate      Other allergy screening: Asthma:  Patient is taking Breo daily and albuterol prn due to ? Pulmonary nodule.  Patient's breathing is better since the surgery.   Rhino conjunctivitis: yes Coughing, shortness of breath, itchy/watery eyes, rhinitis - this year occurred in the spring. Does not take allergy medications for this. She had allergy testing at [facility] many years and was on AIT for 6 months and not sure if it was helping and it was making her itchy.  Food allergy: yes Avoiding almonds as it causes throat scratchiness and throat tightness. Last time she had almonds had facial erythema and swelling.  Has Epipen on hand and never to had to use it. No prior allergy testing to foods.  Dietary History: patient has been eating other foods including milk, eggs, peanut, treenuts (cashews, pecans), sesame, shellfish, fish, soy, wheat, meats, fruits and vegetables.  She reports reading labels and avoiding almonds in diet completely.  Medication allergy: no Hymenoptera allergy: no Eczema: on hands but doesn't use anything.   History of recurrent infections suggestive of immunodeficency: no  Diagnostics: None.   Past Medical History: Patient Active Problem List   Diagnosis Date Noted   Urticaria 01/22/2023   Other adverse food reactions, not elsewhere classified, subsequent encounter 01/22/2023   Other atopic dermatitis 01/22/2023   ? asthma 01/22/2023   Lung nodule 12/30/2022   Lung nodules 12/30/2022   Atypical pneumonia 04/26/2022   Vitamin D deficiency 03/14/2022   Low serum vitamin B12 03/14/2022   Iron deficiency 03/14/2022   S/P lumbar fusion 09/21/2020   Other allergic rhinitis 09/14/2020   Anxiety 09/14/2020   Chronic fatigue syndrome 09/14/2020   Hyperlipidemia 09/14/2020   Obesity 09/14/2020   Dysphagia 09/08/2020   Globus sensation 09/08/2020   Irritable bowel syndrome 09/08/2020   Weight loss counseling, encounter for 06/05/2020   Acquired hypothyroidism 05/05/2020   Long-term current use of lithium 05/05/2020   Bipolar 1 disorder (HCC) 05/05/2020   PTSD (post-traumatic stress disorder) 05/05/2020   Obsessive-compulsive disorder 05/05/2020  Migraines    History of lumbar laminectomy 08/01/2017   Degeneration of lumbar intervertebral disc 08/01/2017   Acid reflux 07/10/2017   Past Medical History:  Diagnosis Date   Abnormal cervical Papanicolaou smear 04/20/2020   Allergies    Anemia    with pregnancy , resolved after delivery   Anxiety    Bipolar disorder (HCC)    Chicken pox    Chronic sinusitis 07/26/2016   no current problems as of 12/05/22   Complication of anesthesia    pt doesn't like mask she has panic attack; childhood   Depression    Dyspnea    with intermittent chest tightness at times, has used daughter's albuterol inhaler which has helped per patient 12/05/22   Family history of adverse reaction to anesthesia    Mother PONV   GERD (gastroesophageal reflux disease)    Hypothyroidism    Migraines    Nonsmoker    Pelvic pain in female 05/13/2014   resolved,  no current problem as of 12/05/22   Pneumonia    x 1   PONV (postoperative nausea and vomiting)    no issues with short procdures, had one isntance of vomitus following a longer procedure    Sciatica    Thyroid disease    Past Surgical History: Past Surgical History:  Procedure Laterality Date   ABDOMINAL EXPOSURE N/A 09/21/2020   Procedure: LATERAL EXPOSURE FOR OBLIQUE LATERAL SPINE SURGERY;  Surgeon: Cephus Shelling, MD;  Location: [facility] OR;  Service: Vascular;  Laterality: N/A;   ANTERIOR LATERAL LUMBAR FUSION WITH PERCUTANEOUS SCREW 1 LEVEL N/A 09/21/2020   Procedure: OBLIQUE LUMBAR INTERBODY FUSION LUMBAR FOUR THROUGH FIVE, POSTERIOR SPINAL FUSION INSTRUMENTATION LUMAR FOUR THROUGH FIVE;  Surgeon: Venita Lick, MD;  Location: [facility] OR;  Service: Orthopedics;  Laterality: N/A;   BACK SURGERY     BIOPSY  12/30/2022   Procedure: BIOPSY;  Surgeon: Josephine Igo, DO;  Location: [facility] ENDOSCOPY;  Service: Pulmonary;;   CESAREAN SECTION  x2   2000, 2005   COLONOSCOPY     03/2022   FIDUCIAL MARKER PLACEMENT  12/30/2022   Procedure: Dye Marking;  Surgeon: Josephine Igo, DO;  Location: [facility] ENDOSCOPY;  Service: Pulmonary;;   LAPAROSCOPY N/A 05/13/2014   Procedure: LAPAROSCOPY DIAGNOSTIC;  Surgeon: Lavina Hamman, MD;  Location: [facility] ORS;  Service: Gynecology;  Laterality: N/A;   LUMBAR DISC SURGERY  2023   SI joint   LUMBAR LAMINECTOMY/DECOMPRESSION MICRODISCECTOMY Left 02/12/2017   Procedure: Microlumbar decompression L5-S1 left ;  Surgeon: Jene Every, MD;  Location: [facility] ORS;  Service: Orthopedics;  Laterality: Left;  90 mins   LYMPH NODE DISSECTION Left 12/30/2022   Procedure: LYMPH NODE DISSECTION;  Surgeon: Corliss Skains, MD;  Location: [facility] OR;  Service: Thoracic;  Laterality: Left;   SALIVARY GLAND SURGERY  1986   SPINAL CORD STIMULATOR REMOVAL  09/2022   UPPER GI ENDOSCOPY     WISDOM TOOTH EXTRACTION     Medication List:  Current Outpatient Medications  Medication Sig  Dispense Refill   famotidine (PEPCID) 20 MG tablet Take 1 tablet (20 mg total) by mouth 2 (two) times daily. 60 tablet 3   acetaminophen (TYLENOL) 325 MG tablet Take 2 tablets (650 mg total) by mouth every 4 (four) hours as needed for mild pain or moderate pain.     albuterol (VENTOLIN HFA) 108 (90 Base) MCG/ACT inhaler Inhale 2 puffs into the lungs every 6 (six) hours as needed for wheezing or shortness of breath. 8  g 6   BREO ELLIPTA 100-25 MCG/ACT AEPB Inhale 1 puff into the lungs daily.     cetirizine (ZYRTEC) 10 MG tablet Take 10 mg by mouth daily.     clonazePAM (KLONOPIN) 0.5 MG tablet Take 0.5 mg by mouth 3 (three) times daily as needed for anxiety.     EMGALITY 120 MG/ML SOAJ Inject 120 mg into the skin every 28 (twenty-eight) days. 1.12 mL 11   esomeprazole (NEXIUM) 20 MG capsule Take 20 mg by mouth daily at 12 noon.     ferrous sulfate 325 (65 FE) MG tablet Take 325 mg by mouth daily with breakfast.     fluticasone (FLONASE) 50 MCG/ACT nasal spray Place 2 sprays into both nostrils daily.     gabapentin (NEURONTIN) 300 MG capsule Take 300 mg by mouth 3 (three) times daily.     hydrocortisone cream 1 % Apply 1 Application topically as needed for itching.     lamoTRIgine (LAMICTAL) 25 MG tablet Take 150 mg by mouth at bedtime.     levonorgestrel (MIRENA, 52 MG,) 20 MCG/DAY IUD Mirena 21 mcg/24 hours (8 yrs) 52 mg intrauterine device  Take 1 device by intrauterine route.     levothyroxine (SYNTHROID) 25 MCG tablet 1 tab daily p.o. on an empty stomach x6 days a week, 2 tabs p.o. 1 day a week. 102 tablet 3   lithium carbonate (LITHOBID) 300 MG ER tablet Take 900 mg by mouth every evening.     sertraline (ZOLOFT) 100 MG tablet Take 150 mg by mouth daily.     SUMAtriptan (IMITREX) 100 MG tablet Take 1 tablet earliest onset of migraine.  May repeat in 2 hours. Maximum 2 tablets in 24 hours. 10 tablet 11   traZODone (DESYREL) 100 MG tablet Take 100 mg by mouth at bedtime.     No current  facility-administered medications for this visit.   Allergies: Allergies  Allergen Reactions   Almond (Diagnostic) Anaphylaxis   Almond Oil Anaphylaxis and Other (See Comments)   Codeine Other (See Comments)    Makes patient aggressive     Hydrocodone Other (See Comments)    Makes patient aggressive    Social History: Social History   Socioeconomic History   Marital status: Married    Spouse name: Not on file   Number of children: 3   Years of education: Not on file   Highest education level: Bachelor's degree (e.g., BA, AB, BS)  Occupational History   Not on file  Tobacco Use   Smoking status: Never   Smokeless tobacco: Never  Vaping Use   Vaping status: Never Used  Substance and Sexual Activity   Alcohol use: Yes    Comment: social   Drug use: Not Currently    Types: Marijuana    Comment: informed do not use 24 hrs prior to surgery 12/05/22 at PAT appt   Sexual activity: Yes    Partners: Male    Birth control/protection: I.U.D.    Comment: Mirena IUD  Other Topics Concern   Not on file  Social History Narrative   Marital status/children/pets: Married, 3 children.    Education/employment: Oncologist, healthcare advocate   Safety:      -smoke alarm in the home:Yes     - wears seatbelt: Yes     - Feels safe in their relationships: Yes   Right handed   No caffeine   One story home   Social Determinants of Health   Financial Resource Strain: Not on  file  Food Insecurity: No Food Insecurity (12/30/2022)   Hunger Vital Sign    Worried About Running Out of Food in the Last Year: Never true    Ran Out of Food in the Last Year: Never true  Transportation Needs: No Transportation Needs (12/30/2022)   PRAPARE - Administrator, Civil Service (Medical): No    Lack of Transportation (Non-Medical): No  Physical Activity: Not on file  Stress: Not on file  Social Connections: Not on file   Lives in a 45 year old house. Smoking: denies Occupation:  support Leisure centre manager History: Water Damage/mildew in the house: no Carpet in the family room: no Carpet in the bedroom: yes Heating: electric Cooling: central Pet: yes 1 dog outdoors.  Family History: Family History  Problem Relation Age of Onset   Depression Mother    Mental illness Mother    Diabetes Brother    Drug abuse Brother    Early death Brother    Mental illness Brother    Breast cancer Maternal Grandmother 30   Cancer Maternal Grandfather        head neck    Diabetes Maternal Grandfather    Lung cancer Maternal Grandfather    Cancer Paternal Grandmother        gallbladder   Colon cancer Paternal Grandfather 65   Diabetes Paternal Grandfather    Breast cancer Maternal Aunt 65   Problem                               Relation Asthma                                   no Eczema                                no Food allergy                          no Allergic rhino conjunctivitis     mother  Review of Systems  Constitutional:  Negative for appetite change, chills, fever and unexpected weight change.  HENT:  Negative for congestion and rhinorrhea.   Eyes:  Negative for itching.  Respiratory:  Negative for cough, chest tightness, shortness of breath and wheezing.   Cardiovascular:  Negative for chest pain.  Gastrointestinal:  Negative for abdominal pain.  Genitourinary:  Negative for difficulty urinating.  Skin:  Positive for rash.  Neurological:  Negative for headaches.    Objective: BP 124/86   Pulse 68   Temp (!) 97.1 F (36.2 C)   Resp 18   Ht 5\' 4"  (1.626 m)   Wt 204 lb 12.8 oz (92.9 kg)   SpO2 99%   BMI 35.15 kg/m  Body mass index is 35.15 kg/m. Physical Exam Vitals and nursing note reviewed.  Constitutional:      Appearance: Normal appearance. She is well-developed.  HENT:     Head: Normocephalic and atraumatic.     Right Ear: Tympanic membrane and external ear normal.     Left Ear: Tympanic membrane and external ear normal.      Nose: Nose normal.     Mouth/Throat:     Mouth: Mucous membranes are moist.     Pharynx: Oropharynx is clear.  Eyes:     Conjunctiva/sclera: Conjunctivae normal.  Cardiovascular:     Rate and Rhythm: Normal rate and regular rhythm.     Heart sounds: Normal heart sounds. No murmur heard.    No friction rub. No gallop.  Pulmonary:     Effort: Pulmonary effort is normal.     Breath sounds: Normal breath sounds. No wheezing, rhonchi or rales.  Musculoskeletal:     Cervical back: Neck supple.  Skin:    General: Skin is warm.  Neurological:     Mental Status: She is alert and oriented to person, place, and time.  Psychiatric:        Behavior: Behavior normal.   The plan was reviewed with the patient/family, and all questions/concerned were addressed.  It was my pleasure to see Sheila Mathews today and participate in her care. Please feel free to contact me with any questions or concerns.  Sincerely,  Wyline Mood, DO Allergy & Immunology  [facility] office: [phone] [facility] office: [phone]

## 2023-01-21 ENCOUNTER — Ambulatory Visit (INDEPENDENT_AMBULATORY_CARE_PROVIDER_SITE_OTHER): Payer: BC Managed Care – PPO | Admitting: Allergy

## 2023-01-21 VITALS — BP 124/86 | HR 68 | Temp 97.1°F | Resp 18 | Ht 64.0 in | Wt 204.8 lb

## 2023-01-21 DIAGNOSIS — T781XXD Other adverse food reactions, not elsewhere classified, subsequent encounter: Secondary | ICD-10-CM | POA: Diagnosis not present

## 2023-01-21 DIAGNOSIS — J3089 Other allergic rhinitis: Secondary | ICD-10-CM | POA: Diagnosis not present

## 2023-01-21 DIAGNOSIS — L2089 Other atopic dermatitis: Secondary | ICD-10-CM

## 2023-01-21 DIAGNOSIS — J45998 Other asthma: Secondary | ICD-10-CM

## 2023-01-21 DIAGNOSIS — J45909 Unspecified asthma, uncomplicated: Secondary | ICD-10-CM

## 2023-01-21 DIAGNOSIS — L509 Urticaria, unspecified: Secondary | ICD-10-CM

## 2023-01-21 MED ORDER — FAMOTIDINE 20 MG PO TABS: 20.0000 mg | ORAL_TABLET | Freq: Two times a day (BID) | ORAL | 3 refills | Status: DC

## 2023-01-21 NOTE — Patient Instructions (Addendum)
Hives: Based on clinical history, she likely has chronic idiopathic urticaria. Discussed with patient, that urticaria is usually caused by release of histamine by cutaneous mast cells but sometimes it is non-histamine mediated. Explained that urticaria is not always associated with allergies. In most cases, the exact etiology for urticaria can not be established and it is considered idiopathic. Possible to have hive outbreak from physical stress on the body as well.   Start allegra (fexofenadine) 180 mg twice a day. If symptoms are not controlled or causes drowsiness let us know. Start pepcid (famotidine) 20mg  twice a day.  Avoid the following potential triggers: alcohol, tight clothing, NSAIDs, hot showers and getting overheated. See below for proper skin care.   Get bloodwork:  We are ordering labs, so please allow 1-2 weeks for the results to come back. With the newly implemented Cures Act, the labs might be visible to you at the same time that they become visible to me. However, I will not address the results until all of the results are back, so please be patient.   Environmental allergies See below for environmental control measures for pollen. The antihistamines as above should also help.  Will make additional recommendations based on bloodwork results.  Food Continue to avoid almonds. For mild symptoms you can take over the counter antihistamines such as Benadryl 1-2 tablets = 25-50mg  and monitor symptoms closely. If symptoms worsen or if you have severe symptoms including breathing issues, throat closure, significant swelling, whole body hives, severe diarrhea and vomiting, lightheadedness then inject epinephrine and seek immediate medical care afterwards. Emergency action plan given.  Skin See below for proper skin care.   Follow up in 6 weeks or sooner if needed. Make sure you keep your oncologist appointment.   Skin care recommendations  Bath time: Always use lukewarm  water. AVOID very hot or cold water. Keep bathing time to 5-10 minutes. Do NOT use bubble bath. Use a mild soap and use just enough to wash the dirty areas. Do NOT scrub skin vigorously.  After bathing, pat dry your skin with a towel. Do NOT rub or scrub the skin.  Moisturizers and prescriptions:  ALWAYS apply moisturizers immediately after bathing (within 3 minutes). This helps to lock-in moisture. Use the moisturizer several times a day over the whole body. Good summer moisturizers include: Aveeno, CeraVe, Cetaphil. Good winter moisturizers include: Aquaphor, Vaseline, Cerave, Cetaphil, Eucerin, Vanicream. When using moisturizers along with medications, the moisturizer should be applied about one hour after applying the medication to prevent diluting effect of the medication or moisturize around where you applied the medications. When not using medications, the moisturizer can be continued twice daily as maintenance.  Laundry and clothing: Avoid laundry products with added color or perfumes. Use unscented hypo-allergenic laundry products such as Tide free, Cheer free & gentle, and All free and clear.  If the skin still seems dry or sensitive, you can try double-rinsing the clothes. Avoid tight or scratchy clothing such as wool. Do not use fabric softeners or dyer sheets.   Reducing Pollen Exposure Pollen seasons: trees (spring), grass (summer) and ragweed/weeds (fall). Keep windows closed in your home and car to lower pollen exposure.  Install air conditioning in the bedroom and throughout the house if possible.  Avoid going out in dry windy days - especially early morning. Pollen counts are highest between 5 - 10 AM and on dry, hot and windy days.  Save outside activities for late afternoon or after a heavy rain, when pollen  levels are lower.  Avoid mowing of grass if you have grass pollen allergy. Be aware that pollen can also be transported indoors on people and pets.  Dry your  clothes in an automatic dryer rather than hanging them outside where they might collect pollen.  Rinse hair and eyes before bedtime.

## 2023-01-22 ENCOUNTER — Encounter: Payer: Self-pay | Admitting: Allergy

## 2023-01-22 ENCOUNTER — Ambulatory Visit (HOSPITAL_COMMUNITY): Payer: Self-pay | Admitting: Orthopedic Surgery

## 2023-01-22 DIAGNOSIS — T781XXD Other adverse food reactions, not elsewhere classified, subsequent encounter: Secondary | ICD-10-CM | POA: Insufficient documentation

## 2023-01-22 DIAGNOSIS — J45909 Unspecified asthma, uncomplicated: Secondary | ICD-10-CM | POA: Insufficient documentation

## 2023-01-22 DIAGNOSIS — L2089 Other atopic dermatitis: Secondary | ICD-10-CM | POA: Insufficient documentation

## 2023-01-22 DIAGNOSIS — L509 Urticaria, unspecified: Secondary | ICD-10-CM | POA: Insufficient documentation

## 2023-01-22 NOTE — Assessment & Plan Note (Signed)
?   See below for proper skin care. ?

## 2023-01-22 NOTE — Assessment & Plan Note (Signed)
Follows with pulm and taking Breo daily due to shortness of breath. This is better since pulmonary removal. Continue meds as per pulm.

## 2023-01-22 NOTE — Assessment & Plan Note (Signed)
Pruritic rash started in March 2024. Initially daily and ow once per week. No triggers noted. There was some concerns if Breo or Lamictal was a cause but they were not. Denies changes in diet, meds, personal care products. She took zyrtec and benadryl with minimal benefit. Concerned about allergic triggers. Patient also underwent lung nodule removal which was benign. Based on clinical history and pictures, she likely has chronic idiopathic urticaria. Discussed with patient, that urticaria is usually caused by release of histamine by cutaneous mast cells but sometimes it is non-histamine mediated. Explained that urticaria is not always associated with allergies. In most cases, the exact etiology for urticaria can not be established and it is considered idiopathic. Possible to have hive outbreak from physical stress on the body as well.  Start allegra (fexofenadine) 180 mg twice a day. If symptoms are not controlled or causes drowsiness let us know. Start Pepcid (famotidine) 20mg  twice a day.  Avoid the following potential triggers: alcohol, tight clothing, NSAIDs, hot showers and getting overheated. See below for proper skin care.  Get bloodwork. Make sure you keep your oncologist appointment.

## 2023-01-22 NOTE — Assessment & Plan Note (Signed)
Almonds caused erythema, swelling, throat discomfort. No prior work up. Continue to avoid almonds. For mild symptoms you can take over the counter antihistamines such as Benadryl 1-2 tablets = 25-50mg  and monitor symptoms closely. If symptoms worsen or if you have severe symptoms including breathing issues, throat closure, significant swelling, whole body hives, severe diarrhea and vomiting, lightheadedness then inject epinephrine and seek immediate medical care afterwards. Emergency action plan given. Get almond IgE.

## 2023-01-22 NOTE — Assessment & Plan Note (Signed)
Symptomatic this spring. Was on AIT at [facility] allergy for 6 months and then stopped due to pruritus.  See below for environmental control measures for pollen. The antihistamines as above should also help.  Will make additional recommendations based on bloodwork results.

## 2023-01-23 ENCOUNTER — Other Ambulatory Visit: Payer: Self-pay | Admitting: *Deleted

## 2023-01-23 ENCOUNTER — Encounter: Payer: Self-pay | Admitting: Allergy

## 2023-01-23 DIAGNOSIS — F3132 Bipolar disorder, current episode depressed, moderate: Secondary | ICD-10-CM | POA: Diagnosis not present

## 2023-01-23 MED ORDER — FAMOTIDINE 20 MG PO TABS: 20.0000 mg | ORAL_TABLET | Freq: Two times a day (BID) | ORAL | 5 refills | Status: DC

## 2023-01-24 ENCOUNTER — Other Ambulatory Visit: Payer: Self-pay | Admitting: *Deleted

## 2023-01-24 MED ORDER — FAMOTIDINE 20 MG PO TABS: 20.0000 mg | ORAL_TABLET | Freq: Two times a day (BID) | ORAL | 3 refills | Status: DC

## 2023-01-30 DIAGNOSIS — R911 Solitary pulmonary nodule: Secondary | ICD-10-CM

## 2023-01-30 HISTORY — DX: Solitary pulmonary nodule: R91.1

## 2023-02-05 ENCOUNTER — Other Ambulatory Visit: Payer: Self-pay

## 2023-02-05 DIAGNOSIS — R911 Solitary pulmonary nodule: Secondary | ICD-10-CM

## 2023-02-06 ENCOUNTER — Inpatient Hospital Stay (HOSPITAL_BASED_OUTPATIENT_CLINIC_OR_DEPARTMENT_OTHER): Payer: BC Managed Care – PPO

## 2023-02-06 ENCOUNTER — Inpatient Hospital Stay: Payer: BC Managed Care – PPO | Attending: Internal Medicine | Admitting: Internal Medicine

## 2023-02-06 ENCOUNTER — Other Ambulatory Visit: Payer: Self-pay

## 2023-02-06 VITALS — BP 93/54 | HR 65 | Temp 98.4°F | Resp 16 | Ht 64.0 in | Wt 200.6 lb

## 2023-02-06 DIAGNOSIS — Z808 Family history of malignant neoplasm of other organs or systems: Secondary | ICD-10-CM | POA: Insufficient documentation

## 2023-02-06 DIAGNOSIS — Z800 Family history of malignant neoplasm of digestive organs: Secondary | ICD-10-CM | POA: Insufficient documentation

## 2023-02-06 DIAGNOSIS — Z801 Family history of malignant neoplasm of trachea, bronchus and lung: Secondary | ICD-10-CM | POA: Insufficient documentation

## 2023-02-06 DIAGNOSIS — Z803 Family history of malignant neoplasm of breast: Secondary | ICD-10-CM | POA: Insufficient documentation

## 2023-02-06 DIAGNOSIS — D381 Neoplasm of uncertain behavior of trachea, bronchus and lung: Secondary | ICD-10-CM | POA: Insufficient documentation

## 2023-02-06 DIAGNOSIS — R911 Solitary pulmonary nodule: Secondary | ICD-10-CM

## 2023-02-06 LAB — CBC WITH DIFFERENTIAL/PLATELET
Abs Immature Granulocytes: 0.04 10*3/uL (ref 0.00–0.07)
Basophils Absolute: 0 10*3/uL (ref 0.0–0.1)
Basophils Relative: 0 %
Eosinophils Absolute: 0 10*3/uL (ref 0.0–0.5)
Eosinophils Relative: 0 %
HCT: 37.6 % (ref 36.0–46.0)
Hemoglobin: 12.3 g/dL (ref 12.0–15.0)
Immature Granulocytes: 1 %
Lymphocytes Relative: 14 %
Lymphs Abs: 1.1 10*3/uL (ref 0.7–4.0)
MCH: 29.1 pg (ref 26.0–34.0)
MCHC: 32.7 g/dL (ref 30.0–36.0)
MCV: 88.9 fL (ref 80.0–100.0)
Monocytes Absolute: 0.6 10*3/uL (ref 0.1–1.0)
Monocytes Relative: 7 %
Neutro Abs: 6.6 10*3/uL (ref 1.7–7.7)
Neutrophils Relative %: 78 %
Platelets: 362 10*3/uL (ref 150–400)
RBC: 4.23 MIL/uL (ref 3.87–5.11)
RDW: 15.2 % (ref 11.5–15.5)
WBC: 8.3 10*3/uL (ref 4.0–10.5)
nRBC: 0 % (ref 0.0–0.2)

## 2023-02-06 LAB — COMPREHENSIVE METABOLIC PANEL
ALT: 9 U/L (ref 0–44)
AST: 13 U/L — ABNORMAL LOW (ref 15–41)
Albumin: 4.6 g/dL (ref 3.5–5.0)
Alkaline Phosphatase: 56 U/L (ref 38–126)
Anion gap: 5 (ref 5–15)
BUN: 8 mg/dL (ref 6–20)
CO2: 26 mmol/L (ref 22–32)
Calcium: 9.2 mg/dL (ref 8.9–10.3)
Chloride: 108 mmol/L (ref 98–111)
Creatinine, Ser: 0.76 mg/dL (ref 0.44–1.00)
GFR, Estimated: >60 mL/min (ref >60)
Glucose, Bld: 99 mg/dL (ref 70–99)
Potassium: 4.1 mmol/L (ref 3.5–5.1)
Sodium: 139 mmol/L (ref 135–145)
Total Bilirubin: 0.5 mg/dL (ref 0.3–1.2)
Total Protein: 7.5 g/dL (ref 6.5–8.1)

## 2023-02-06 NOTE — Progress Notes (Signed)
[facility] [phone]   [phone]  CONSULT NOTE  REFERRING PHYSICIAN: Dr. 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 female with past medical history significant for multiple medical problems including history of allergy, asthma, anxiety, bipolar disorder, chronic sinusitis, depression, thyroid disease, sciatica, migraine headache as well as GERD.  The patient is a never smoker.  She was seen by her primary care physician in October 2023 complaining of shortness of breath.  She had chest x-ray at that time that was unremarkable.  She then had CT scan of the chest on April 08, 2022 and that showed a left lower lobe nodular opacity.  The patient was followed by observation and repeat CT scan of the chest on October 24, 2022 showed the left lower lobe pulmonary nodule increased in size up to 1.0 x 0.9 cm.  She was referred to Dr. Tonia Brooms and a PET scan on 11/20/2022 showed 1.0 cm irregular nodule in the left lung base with mild hypermetabolic activity and no other evidence of metastatic disease. On December 30, 2022 the patient underwent video bronchoscopy with navigational bronchoscopy under the care of Dr. Tonia Brooms and the fine-needle aspiration of the left lower lobe nodule showed atypical cells that was not diagnostic for malignancy.  At the same setting the patient underwent left lower lobe wedge resection with lymph node sampling under the care of Dr. Cliffton Asters.  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  Lymph node from level 9 was also negative for metastatic carcinoma. The patient was referred to me today for evaluation and recommendation regarding her condition. When seen today she is feeling fine except for some soreness on the left side of the chest and numbness in the  left breast area.  She has mild shortness of breath and cough but no significant hemoptysis.  She has occasional nausea and migraine headache but no significant weight loss or night sweats.  She has no vomiting, diarrhea or constipation. Family history significant for mother and brother with depression.  Brother also has diabetes.  Paternal grandmother had lung cancer and maternal grandmother had leukemia and maternal grandfather had type 1 diabetes. The patient is married and has 3 biological children and 2 stepchildren.  She works for a [employer] service [employer].  She has no history for smoking, alcohol or drug abuse. HPI  Past Medical History:  Diagnosis Date   Abnormal cervical Papanicolaou smear 04/20/2020   Allergies    Anemia    with pregnancy , resolved after delivery   Anxiety    Bipolar disorder (HCC)    Chicken pox    Chronic sinusitis 07/26/2016   no current problems as of 12/05/22   Complication of anesthesia    pt doesn't like mask she has panic attack; childhood   Depression    Dyspnea    with intermittent chest tightness at times, has used daughter's albuterol inhaler which has helped per patient 12/05/22   Family history of adverse reaction to anesthesia    Mother PONV   GERD (gastroesophageal reflux disease)    Hypothyroidism    Migraines    Nonsmoker    Pelvic pain in female 05/13/2014   resolved, no current problem as of 12/05/22   Pneumonia    x 1   PONV (postoperative nausea and vomiting)  no issues with short procdures, had one isntance of vomitus following a longer procedure    Sciatica    Thyroid disease     Past Surgical History:  Procedure Laterality Date   ABDOMINAL EXPOSURE N/A 09/21/2020   Procedure: LATERAL EXPOSURE FOR OBLIQUE LATERAL SPINE SURGERY;  Surgeon: Cephus Shelling, MD;  Location: [facility] OR;  Service: Vascular;  Laterality: N/A;   ANTERIOR LATERAL LUMBAR FUSION WITH PERCUTANEOUS SCREW 1 LEVEL N/A 09/21/2020   Procedure: OBLIQUE LUMBAR  INTERBODY FUSION LUMBAR FOUR THROUGH FIVE, POSTERIOR SPINAL FUSION INSTRUMENTATION LUMAR FOUR THROUGH FIVE;  Surgeon: Venita Lick, MD;  Location: [facility] OR;  Service: Orthopedics;  Laterality: N/A;   BACK SURGERY     BIOPSY  12/30/2022   Procedure: BIOPSY;  Surgeon: Josephine Igo, DO;  Location: [facility];  Service: Pulmonary;;   CESAREAN SECTION  x2   2000, 2005   COLONOSCOPY     03/2022   FIDUCIAL MARKER PLACEMENT  12/30/2022   Procedure: Dye Marking;  Surgeon: Josephine Igo, DO;  Location: [facility];  Service: Pulmonary;;   LAPAROSCOPY N/A 05/13/2014   Procedure: LAPAROSCOPY DIAGNOSTIC;  Surgeon: Lavina Hamman, MD;  Location: [facility]S;  Service: Gynecology;  Laterality: N/A;   LUMBAR DISC SURGERY  2023   SI joint   LUMBAR LAMINECTOMY/DECOMPRESSION MICRODISCECTOMY Left 02/12/2017   Procedure: Microlumbar decompression L5-S1 left ;  Surgeon: Jene Every, MD;  Location: [facility];  Service: Orthopedics;  Laterality: Left;  90 mins   LYMPH NODE DISSECTION Left 12/30/2022   Procedure: LYMPH NODE DISSECTION;  Surgeon: Corliss Skains, MD;  Location: [facility] OR;  Service: Thoracic;  Laterality: Left;   SALIVARY GLAND SURGERY  1986   SPINAL CORD STIMULATOR REMOVAL  09/2022   UPPER GI ENDOSCOPY     WISDOM TOOTH EXTRACTION      Family History  Problem Relation Age of Onset   Depression Mother    Mental illness Mother    Diabetes Brother    Drug abuse Brother    Early death Brother    Mental illness Brother    Breast cancer Maternal Grandmother 30   Cancer Maternal Grandfather        head neck    Diabetes Maternal Grandfather    Lung cancer Maternal Grandfather    Cancer Paternal Grandmother        gallbladder   Colon cancer Paternal Grandfather 21   Diabetes Paternal Grandfather    Breast cancer Maternal Aunt 67    Social History Social History   Tobacco Use   Smoking status: Never   Smokeless tobacco: Never  Vaping Use   Vaping status: Never Used  Substance Use  Topics   Alcohol use: Yes    Comment: social   Drug use: Not Currently    Types: Marijuana    Comment: informed do not use 24 hrs prior to surgery 12/05/22 at PAT appt    Allergies  Allergen Reactions   Almond (Diagnostic) Anaphylaxis   Almond Oil Anaphylaxis and Other (See Comments)   Codeine Other (See Comments)    Makes patient aggressive     Hydrocodone Other (See Comments)    Makes patient aggressive     Current Outpatient Medications  Medication Sig Dispense Refill   acetaminophen (TYLENOL) 325 MG tablet Take 2 tablets (650 mg total) by mouth every 4 (four) hours as needed for mild pain or moderate pain.     albuterol (VENTOLIN HFA) 108 (90 Base) MCG/ACT inhaler Inhale 2 puffs into  the lungs every 6 (six) hours as needed for wheezing or shortness of breath. 8 g 6   BREO ELLIPTA 100-25 MCG/ACT AEPB Inhale 1 puff into the lungs daily.     cetirizine (ZYRTEC) 10 MG tablet Take 10 mg by mouth daily.     clonazePAM (KLONOPIN) 0.5 MG tablet Take 0.5 mg by mouth 3 (three) times daily as needed for anxiety.     EMGALITY 120 MG/ML SOAJ Inject 120 mg into the skin every 28 (twenty-eight) days. 1.12 mL 11   esomeprazole (NEXIUM) 20 MG capsule Take 20 mg by mouth daily at 12 noon.     famotidine (PEPCID) 20 MG tablet Take 1 tablet (20 mg total) by mouth 2 (two) times daily. 60 tablet 5   famotidine (PEPCID) 20 MG tablet Take 1 tablet (20 mg total) by mouth 2 (two) times daily. 60 tablet 3   ferrous sulfate 325 (65 FE) MG tablet Take 325 mg by mouth daily with breakfast.     fluticasone (FLONASE) 50 MCG/ACT nasal spray Place 2 sprays into both nostrils daily.     gabapentin (NEURONTIN) 300 MG capsule Take 300 mg by mouth 3 (three) times daily.     hydrocortisone cream 1 % Apply 1 Application topically as needed for itching.     lamoTRIgine (LAMICTAL) 25 MG tablet Take 150 mg by mouth at bedtime.     levonorgestrel (MIRENA, 52 MG,) 20 MCG/DAY IUD Mirena 21 mcg/24 hours (8 yrs) 52 mg  intrauterine device  Take 1 device by intrauterine route.     levothyroxine (SYNTHROID) 25 MCG tablet 1 tab daily p.o. on an empty stomach x6 days a week, 2 tabs p.o. 1 day a week. 102 tablet 3   lithium carbonate (LITHOBID) 300 MG ER tablet Take 900 mg by mouth every evening.     sertraline (ZOLOFT) 100 MG tablet Take 150 mg by mouth daily.     SUMAtriptan (IMITREX) 100 MG tablet Take 1 tablet earliest onset of migraine.  May repeat in 2 hours. Maximum 2 tablets in 24 hours. 10 tablet 11   traZODone (DESYREL) 100 MG tablet Take 100 mg by mouth at bedtime.     No current facility-administered medications for this visit.    Review of Systems  Constitutional: negative Eyes: negative Ears, nose, mouth, throat, and face: negative Respiratory: positive for cough, dyspnea on exertion, and pleurisy/chest pain Cardiovascular: negative Gastrointestinal: positive for nausea Genitourinary:negative Integument/breast: negative Hematologic/lymphatic: negative Musculoskeletal:negative Neurological: negative Behavioral/Psych: negative Endocrine: negative Allergic/Immunologic: negative  Physical Exam  GNF:AOZHY, healthy, no distress, well nourished, and well developed SKIN: skin color, texture, turgor are normal, no rashes or significant lesions HEAD: Normocephalic, No masses, lesions, tenderness or abnormalities EYES: normal, PERRLA, Conjunctiva are pink and non-injected EARS: External ears normal, Canals clear OROPHARYNX:no exudate, no erythema, and lips, buccal mucosa, and tongue normal  NECK: supple, no adenopathy, no JVD LYMPH:  no palpable lymphadenopathy, no hepatosplenomegaly BREAST:not examined LUNGS: clear to auscultation , and palpation HEART: regular rate & rhythm, no murmurs, and no gallops ABDOMEN:abdomen soft, non-tender, normal bowel sounds, and no masses or organomegaly BACK: Back symmetric, no curvature., No CVA tenderness EXTREMITIES:no joint deformities, effusion, or  inflammation, no edema  NEURO: alert & oriented x 3 with fluent speech, no focal motor/sensory deficits  PERFORMANCE STATUS: ECOG 0  LABORATORY DATA: Lab Results  Component Value Date   WBC 8.3 02/06/2023   HGB 12.3 02/06/2023   HCT 37.6 02/06/2023   MCV 88.9 02/06/2023   PLT  362 02/06/2023      Chemistry      Component Value Date/Time   NA 139 02/06/2023 1121   K 4.1 02/06/2023 1121   CL 108 02/06/2023 1121   CO2 26 02/06/2023 1121   BUN 8 02/06/2023 1121   CREATININE 0.76 02/06/2023 1121      Component Value Date/Time   CALCIUM 9.2 02/06/2023 1121   ALKPHOS 56 02/06/2023 1121   AST 13 (L) 02/06/2023 1121   ALT 9 02/06/2023 1121   BILITOT 0.5 02/06/2023 1121       RADIOGRAPHIC STUDIES: DG Chest 2 View  Result Date: 01/15/2023 CLINICAL DATA:  Status post lobectomy on July 1st EXAM: CHEST - 2 VIEW COMPARISON:  12/31/2022 FINDINGS: Interval removal of left-sided chest tube. Normal cardiac and mediastinal contours. No focal pulmonary opacity. No pleural effusion or pneumothorax. No acute osseous abnormality. IMPRESSION: No active cardiopulmonary disease. Electronically Signed   By: Wiliam Ke M.D.   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  Her surgical resection was on December 30, 2022 under the care of Dr. Cliffton Asters   PLAN: I had a lengthy discussion today with the patient and her husband about her current condition.  I explained to the patient that she has no evidence of malignancy on the specimen which was already resected and she is cured from the bronchiolar adenoma. I explained to the patient that there is no need for any additional intervention or treatment.  I also explained to the patient that standard of care is routine monitoring and follow-up visit by her primary care physician at  this point.  I do not see any need for imaging studies at regular basis in this patient but only if symptomatic. She was advised to call if she has any concerning symptoms I will see her on as needed basis.  The patient voices understanding of current disease status and treatment options and is in agreement with the current care plan.  All questions were answered. The patient knows to call the clinic with any problems, questions or concerns. We can certainly see the patient much sooner if necessary.  Thank you so much for allowing me to participate in the care of Sheila Mathews. I will continue to follow up the patient with you and assist in her care.  The total time spent in the appointment was 60 minutes.  Disclaimer: This note was dictated with voice recognition software. Similar sounding words can inadvertently be transcribed and may not be corrected upon review.   Lajuana Matte February 06, 2023, 12:34 PM

## 2023-02-11 ENCOUNTER — Telehealth: Payer: Self-pay

## 2023-02-11 ENCOUNTER — Other Ambulatory Visit: Payer: Self-pay | Admitting: Thoracic Surgery (Cardiothoracic Vascular Surgery)

## 2023-02-11 ENCOUNTER — Other Ambulatory Visit (HOSPITAL_COMMUNITY): Payer: Self-pay

## 2023-02-11 DIAGNOSIS — R911 Solitary pulmonary nodule: Secondary | ICD-10-CM

## 2023-02-11 NOTE — Telephone Encounter (Signed)
Pharmacy Patient Advocate Encounter   Received notification from CoverMyMeds that prior authorization for Emgality 120MG /ML auto-injectors (migraine) is required/requested.   Insurance verification completed.   The patient is insured through [facility] .   Per test claim: PA required; PA started via CoverMyMeds. KEY [id number] . Waiting for clinical questions to populate.

## 2023-02-12 ENCOUNTER — Ambulatory Visit
Admission: RE | Admit: 2023-02-12 | Discharge: 2023-02-12 | Disposition: A | Discharge status: Home / Self Care | Payer: BC Managed Care – PPO | Source: Ambulatory Visit | Attending: Thoracic Surgery (Cardiothoracic Vascular Surgery) | Admitting: Thoracic Surgery (Cardiothoracic Vascular Surgery)

## 2023-02-12 ENCOUNTER — Ambulatory Visit (INDEPENDENT_AMBULATORY_CARE_PROVIDER_SITE_OTHER): Payer: Self-pay | Admitting: Surgical

## 2023-02-12 VITALS — BP 107/75 | HR 81 | Resp 20 | Ht 64.0 in | Wt 200.0 lb

## 2023-02-12 DIAGNOSIS — Z902 Acquired absence of lung [part of]: Secondary | ICD-10-CM | POA: Diagnosis not present

## 2023-02-12 DIAGNOSIS — R911 Solitary pulmonary nodule: Secondary | ICD-10-CM

## 2023-02-12 DIAGNOSIS — Z09 Encounter for follow-up examination after completed treatment for conditions other than malignant neoplasm: Secondary | ICD-10-CM

## 2023-02-12 NOTE — Progress Notes (Signed)
[address]       [place] [address]             [phone]      JORDAYN SALENTINE [facility] Medical Record #[id number] Date of Birth: 07-28-1975  Referring: [name], NP Primary Care: Odis Luster, PA-C Primary Cardiologist: None   Chief Complaint:   POST OP FOLLOW UP  12/30/2022   Patient:  Sheila Mathews Pre-Op Dx: Left lower lobe pulmonary nodule   Post-op Dx:  same Procedure: - Robotic assisted left video thoracoscopy - Wedge resection of the left lower lobe - Intercostal nerve block   Surgeon and Role:      * Lightfoot, Eliezer Lofts, MD - Primary    Webb Laws, PA-C - assisting   SURGICAL PATHOLOGY CASE: [id number] PATIENT: Conni Slipper Surgical Pathology Report     Clinical History: LLL pulmonary nodule (cm)     FINAL MICROSCOPIC DIAGNOSIS:  A. LUNG, LEFT LOWER LOBE, WEDGE RESECTION: Bronchiolar adenoma with lymphoid stroma. Negative for malignancy. See comment.  B. LYMPH NODE, LEVEL 9, EXCISION: One lymph node negative for metastatic carcinoma (0/1).  COMMENT: The tumor is characterized by glandular spaces lined by a single layer of mostly ciliated columnar cell epithelium with interspersed mucinous cells.  The intervening stroma has abundant lymphoid tissue and the features are most consistent with distal type bronchiolar adenoma.  Drs. Kashikar and Picklesimer reviewed this case and agree.  INTRAOPERATIVE CONSULTATION: A.  Left lower lobe wedge-frozen on both nodules stitch marks both nodules: "FS DX A1/A2: Atypical specimen acinar proliferation with an atypical lymphoid infiltrate" Intraoperative diagnosis rendered by Dr. Venetia Night at 10:33 AM on 12/30/2022.  GROSS DESCRIPTION: A.  Specimen: Received fresh is "left lower lobe wedge-frozen on both nodules; stitch marks both nodules" Specimen integrity: Intact Size, weight: 10.2 x 5.8 x 1.8 cm, 32 g Pleura: Purple-pink with 2 stitches indicating  nodules: The nodule further from the margin is over inked red and the margin closer to the margin is over inked black. Lesion: The nodule that is over inked red is 4.1 cm from the margin and measures 0.4 x 0.3 x 0.2 cm.  The nodule that is over inked black is 0.5 cm from the margin and measures 1.2 x 1.0 x 0.9 cm. Margin(s): Inked yellow closest to site of nodularity. Hilar vessels: N/A Nonneoplastic parenchyma: Pink-tan and spongy without further distinct lesions Lymph nodes: None grossly identified Block Summary: A1 frozen section on smaller nodule (further from margin) A2 frozen section on larger nodule (closer to margin) A3 remaining smaller nodule to red inked pleura A4-A5 remaining larger nodule to margin A6 uninvolved  B.  Received fresh is a 0.5 x 0.4 x 0.3 cm gray-tan lymph node candidate.  The candidate is entirely submitted in 1 block. (KW, 12/31/2022)    History of Present Illness:    The patient is a 47 year old female seen in the office on today's daily routine postsurgical follow-up status post the above described procedure.  Pathology as noted above.  She has seen medical oncology and there is no plans for any treatment or surveillance at this time.  She has some minor incisional discomfort at this point but definitely improving over time.  He has some physical limitations due to chronic back problems.  He has had no trouble with her incisions.  Overall, she is pleased with her recovery and plans to return to work tomorrow.      Past  Medical History:  Diagnosis Date   Abnormal cervical Papanicolaou smear 04/20/2020   Allergies    Anemia    with pregnancy , resolved after delivery   Anxiety    Bipolar disorder (HCC)    Chicken pox    Chronic sinusitis 07/26/2016   no current problems as of 12/05/22   Complication of anesthesia    pt doesn't like mask she has panic attack; childhood   Depression    Dyspnea    with intermittent chest tightness at times, has used  daughter's albuterol inhaler which has helped per patient 12/05/22   Family history of adverse reaction to anesthesia    Mother PONV   GERD (gastroesophageal reflux disease)    Hypothyroidism    Migraines    Nonsmoker    Pelvic pain in female 05/13/2014   resolved, no current problem as of 12/05/22   Pneumonia    x 1   PONV (postoperative nausea and vomiting)    no issues with short procdures, had one isntance of vomitus following a longer procedure    Sciatica    Thyroid disease      Social History   Tobacco Use  Smoking Status Never  Smokeless Tobacco Never    Social History   Substance and Sexual Activity  Alcohol Use Yes   Comment: social     Allergies  Allergen Reactions   Almond (Diagnostic) Anaphylaxis   Almond Oil Anaphylaxis and Other (See Comments)   Codeine Other (See Comments)    Makes patient aggressive     Hydrocodone Other (See Comments)    Makes patient aggressive     Current Outpatient Medications  Medication Sig Dispense Refill   acetaminophen (TYLENOL) 325 MG tablet Take 2 tablets (650 mg total) by mouth every 4 (four) hours as needed for mild pain or moderate pain.     albuterol (VENTOLIN HFA) 108 (90 Base) MCG/ACT inhaler Inhale 2 puffs into the lungs every 6 (six) hours as needed for wheezing or shortness of breath. 8 g 6   BREO ELLIPTA 100-25 MCG/ACT AEPB Inhale 1 puff into the lungs daily.     clonazePAM (KLONOPIN) 0.5 MG tablet Take 0.5 mg by mouth 3 (three) times daily as needed for anxiety.     EMGALITY 120 MG/ML SOAJ Inject 120 mg into the skin every 28 (twenty-eight) days. 1.12 mL 11   esomeprazole (NEXIUM) 20 MG capsule Take 20 mg by mouth daily.     famotidine (PEPCID) 20 MG tablet Take 1 tablet (20 mg total) by mouth 2 (two) times daily. 60 tablet 5   ferrous sulfate 325 (65 FE) MG tablet Take 325 mg by mouth daily with breakfast.     fexofenadine (ALLEGRA) 60 MG tablet Take 60 mg by mouth daily.     fluticasone (FLONASE) 50 MCG/ACT  nasal spray Place 2 sprays into both nostrils daily.     gabapentin (NEURONTIN) 300 MG capsule Take 300 mg by mouth 3 (three) times daily.     hydrocortisone cream 1 % Apply 1 Application topically as needed for itching.     lamoTRIgine (LAMICTAL) 200 MG tablet Take 200 mg by mouth at bedtime.     levonorgestrel (MIRENA, 52 MG,) 20 MCG/DAY IUD 1 each by Intrauterine route once. Every 8 Years, Placed 2021     levothyroxine (SYNTHROID) 25 MCG tablet 1 tab daily p.o. on an empty stomach x6 days a week, 2 tabs p.o. 1 day a week. (Patient taking differently: Take 25-50 mcg by  mouth See admin instructions. 25 mcg 6 days a week each morning, 50 mcg Sunday morning ONLY) 102 tablet 3   lithium carbonate (LITHOBID) 300 MG ER tablet Take 900 mg by mouth every evening.     OVER THE COUNTER MEDICATION Take 1 Dose by mouth daily as needed (constipation). "Smooth Move tea with senna"     sertraline (ZOLOFT) 100 MG tablet Take 150 mg by mouth at bedtime.     SUMAtriptan (IMITREX) 100 MG tablet Take 1 tablet earliest onset of migraine.  May repeat in 2 hours. Maximum 2 tablets in 24 hours. 10 tablet 11   traZODone (DESYREL) 100 MG tablet Take 100 mg by mouth at bedtime.     No current facility-administered medications for this visit.       Physical Exam: There were no vitals taken for this visit.  General appearance: alert, cooperative, and no distress Heart: regular rate and rhythm, S1, S2 normal, no murmur, click, rub or gallop Lungs: clear to auscultation bilaterally Incisions well-healed without evidence of infection   Diagnostic Studies & Laboratory data:     Recent Radiology Findings:   No results found.    Recent Lab Findings: Lab Results  Component Value Date   WBC 8.3 02/06/2023   HGB 12.3 02/06/2023   HCT 37.6 02/06/2023   PLT 362 02/06/2023   GLUCOSE 99 02/06/2023   CHOL 218 (H) 02/05/2021   TRIG 156.0 (H) 02/05/2021   HDL 47.00 02/05/2021   LDLCALC 140 (H) 02/05/2021   ALT 9  02/06/2023   AST 13 (L) 02/06/2023   NA 139 02/06/2023   K 4.1 02/06/2023   CL 108 02/06/2023   CREATININE 0.76 02/06/2023   BUN 8 02/06/2023   CO2 26 02/06/2023   TSH 3.470 01/21/2023   INR 1.1 12/26/2022   HGBA1C 5.4 02/05/2021      Assessment / Plan: The patient continues to do well and her postsurgical recovery.  I reviewed her chest x-ray and there are no concerning findings.  I did not make any changes to her current medication regimen.  We will see the patient again on a as needed basis for any surgically related needs or at request.      Medication Changes: No orders of the defined types were placed in this encounter.     Rowe Clack, PA-C  02/12/2023 1:51 PM

## 2023-02-12 NOTE — Patient Instructions (Signed)
Only restrictions are as related to chronic back pain

## 2023-02-13 NOTE — Telephone Encounter (Signed)
Questions have been answered and PA has been submitted.

## 2023-02-14 ENCOUNTER — Other Ambulatory Visit (HOSPITAL_COMMUNITY): Payer: Self-pay

## 2023-02-14 NOTE — Telephone Encounter (Signed)
Pharmacy Patient Advocate Encounter  Received notification from [facility] that Prior Authorization for Emgality 120MG /ML auto-injectors (migraine) has been APPROVED from 02-13-2023 to 02-13-2024.  Next available fill date: 02-17-2023  PA #/Case ID/Reference #: [id number]

## 2023-02-14 NOTE — Telephone Encounter (Signed)
Patient advised.

## 2023-02-17 NOTE — Pre-Procedure Instructions (Signed)
Surgical Instructions    Your procedure is scheduled on February 27, 2023.  Report to [facility] Main Entrance "A" at 0830 A.M., then check in with the Admitting office.  Call this number if you have problems the morning of surgery:  [phone]   If you have any questions prior to your surgery date call [phone]: Open Monday-Friday 8am-4pm If you experience any cold or flu symptoms such as cough, fever, chills, shortness of breath, etc. between now and your scheduled surgery, please notify us at the above number     Remember:  Do not eat after midnight the night before your surgery  You may drink clear liquids until 07:30 the morning of your surgery.   Clear liquids allowed are: Water, Non-Citrus Juices (without pulp), Carbonated Beverages, Clear Tea, Black Coffee ONLY (NO MILK, CREAM OR POWDERED CREAMER of any kind), and Gatorade    Take these medicines the morning of surgery with A SIP OF WATER:  BREO ELLIPTA 100-25 MCG/ACT AEPB  esomeprazole (NEXIUM)  famotidine (PEPCID) fexofenadine (ALLEGRA)  fluticasone (FLONASE) 50 MCG/ACT nasal spray  gabapentin (NEURONTIN)  levothyroxine (SYNTHROID)   IF needed: acetaminophen (TYLENOL)  albuterol (VENTOLIN HFA) 108 (90 Base) MCG/ACT inhaler - Please bring all inhalers with you the day of surgery.  clonazePAM (KLONOPIN)  SUMAtriptan (IMITREX)   As of today, STOP taking any Aspirin (unless otherwise instructed by your surgeon) Aleve, Naproxen, Ibuprofen, Motrin, Advil, Goody's, BC's, all herbal medications, fish oil, and all vitamins.   [facility] is not responsible for any belongings or valuables.    Do NOT Smoke (Tobacco/Vaping)  24 hours prior to your procedure  If you use a CPAP at night, you may bring your mask for your overnight stay.   Contacts, glasses, hearing aids, dentures or partials may not be worn into surgery, please bring cases for these belongings   For patients admitted to the hospital, discharge time will be  determined by your treatment team.   Patients discharged the day of surgery will not be allowed to drive home, and someone needs to stay with them for 24 hours.   SURGICAL WAITING ROOM VISITATION Patients having surgery or a procedure may have no more than 2 support people in the waiting area - these visitors may rotate.   Children under the age of 38 must have an adult with them who is not the patient. If the patient needs to stay at the hospital during part of their recovery, the visitor guidelines for inpatient rooms apply. Pre-op nurse will coordinate an appropriate time for 1 support person to accompany patient in pre-op.  This support person may not rotate.   Please refer to [email or web address] for the visitor guidelines for Inpatients (after your surgery is over and you are in a regular room).    Special instructions:    Oral Hygiene is also important to reduce your risk of infection.  Remember - BRUSH YOUR TEETH THE MORNING OF SURGERY WITH YOUR REGULAR TOOTHPASTE     Pre-operative 5 CHG Bath Instructions   You can play a key role in reducing the risk of infection after surgery. Your skin needs to be as free of germs as possible. You can reduce the number of germs on your skin by washing with CHG (chlorhexidine gluconate) soap before surgery. CHG is an antiseptic soap that kills germs and continues to kill germs even after washing.   DO NOT use if you have an allergy to chlorhexidine/CHG or antibacterial soaps. If your skin becomes  reddened or irritated, stop using the CHG and notify one of our RNs at [phone].   Please shower with the CHG soap starting 4 days before surgery using the following schedule:     Please keep in mind the following:  DO NOT shave, including legs and underarms, starting the day of your first shower.   You may shave your face at any point before/day of surgery.  Place clean sheets on your bed the  day you start using CHG soap. Use a clean washcloth (not used since being washed) for each shower. DO NOT sleep with pets once you start using the CHG.   CHG Shower Instructions:  If you choose to wash your hair and private area, wash first with your normal shampoo/soap.  After you use shampoo/soap, rinse your hair and body thoroughly to remove shampoo/soap residue.  Turn the water OFF and apply about 3 tablespoons (45 ml) of CHG soap to a CLEAN washcloth.  Apply CHG soap ONLY FROM YOUR NECK DOWN TO YOUR TOES (washing for 3-5 minutes)  DO NOT use CHG soap on face, private areas, open wounds, or sores.  Pay special attention to the area where your surgery is being performed.  If you are having back surgery, having someone wash your back for you may be helpful. Wait 2 minutes after CHG soap is applied, then you may rinse off the CHG soap.  Pat dry with a clean towel  Put on clean clothes/pajamas   If you choose to wear lotion, please use ONLY the CHG-compatible lotions on the back of this paper.     Additional instructions for the day of surgery: DO NOT APPLY any lotions, deodorants, cologne, or perfumes.   Put on clean/comfortable clothes.  Brush your teeth.  Ask your nurse before applying any prescription medications to the skin.          Do not wear jewelry or makeup. Men may shave face and neck. Do not bring valuables to the hospital. Do not wear nail polish, gel polish, artificial nails, or any other type of covering on natural nails (fingers and toes) If you have artificial nails or gel coating that need to be removed by a nail salon, please have this removed prior to surgery. Artificial nails or gel coating may interfere with anesthesia's ability to adequately monitor your vital signs.     CHG Compatible Lotions   Aveeno Moisturizing lotion  Cetaphil Moisturizing Cream  Cetaphil Moisturizing Lotion  Clairol Herbal Essence Moisturizing Lotion, Dry Skin  Clairol Herbal  Essence Moisturizing Lotion, Extra Dry Skin  Clairol Herbal Essence Moisturizing Lotion, Normal Skin  Curel Age Defying Therapeutic Moisturizing Lotion with Alpha Hydroxy  Curel Extreme Care Body Lotion  Curel Soothing Hands Moisturizing Hand Lotion  Curel Therapeutic Moisturizing Cream, Fragrance-Free  Curel Therapeutic Moisturizing Lotion, Fragrance-Free  Curel Therapeutic Moisturizing Lotion, Original Formula  Eucerin Daily Replenishing Lotion  Eucerin Dry Skin Therapy Plus Alpha Hydroxy Crme  Eucerin Dry Skin Therapy Plus Alpha Hydroxy Lotion  Eucerin Original Crme  Eucerin Original Lotion  Eucerin Plus Crme Eucerin Plus Lotion  Eucerin TriLipid Replenishing Lotion  Keri Anti-Bacterial Hand Lotion  Keri Deep Conditioning Original Lotion Dry Skin Formula Softly Scented  Keri Deep Conditioning Original Lotion, Fragrance Free Sensitive Skin Formula  Keri Lotion Fast Absorbing Fragrance Free Sensitive Skin Formula  Keri Lotion Fast Absorbing Softly Scented Dry Skin Formula  Keri Original Lotion  Keri Skin Renewal Lotion Keri Silky Smooth Lotion  Keri Silky Smooth Sensitive Skin  Lotion  Nivea Body Creamy Conditioning Oil  Nivea Body Extra Enriched Teacher, adult education Moisturizing Lotion Nivea Crme  Nivea Skin Firming Lotion  NutraDerm 30 Skin Lotion  NutraDerm Skin Lotion  NutraDerm Therapeutic Skin Cream  NutraDerm Therapeutic Skin Lotion  ProShield Protective Hand Cream  Provon moisturizing lotion    If you received a COVID test during your pre-op visit, it is requested that you wear a mask when out in public, stay away from anyone that may not be feeling well, and notify your surgeon if you develop symptoms. If you have been in contact with anyone that has tested positive in the last 10 days, please notify your surgeon.    Please read over the following fact sheets that you were given.

## 2023-02-17 NOTE — Progress Notes (Signed)
PCP - Chilton Greathouse, PA-C  Cardiothoracic - Dr Judi Cong Neurology - Shon Millet, DO   Chest x-ray - 02/12/23 EKG - 12/06/22 Stress Test - n/a ECHO - n/a Cardiac Cath - n/a  ICD Pacemaker/Loop - n/a  Sleep Study -  n/a CPAP - none  Diabetes - n/a  ERAS: Clear liquids til 7:30 AM DOS  STOP now taking any Aspirin (unless otherwise instructed by your surgeon), Aleve, Naproxen, Ibuprofen, Motrin, Advil, Goody's, BC's, all herbal medications, fish oil, and all vitamins.   Coronavirus Screening Do you have any of the following symptoms:  Cough yes/no: No Fever (>100.56F)  yes/no: No Runny nose yes/no: No Sore throat yes/no: No Difficulty breathing/shortness of breath  yes/no: No  Have you traveled in the last 14 days and where? yes/no: No  Patient verbalized understanding of instructions that were given to them at the PAT appointment. Patient was also instructed that they will need to review over the PAT instructions again at home before surgery.

## 2023-02-18 ENCOUNTER — Other Ambulatory Visit: Payer: Self-pay

## 2023-02-18 ENCOUNTER — Encounter (HOSPITAL_COMMUNITY)
Admission: RE | Admit: 2023-02-18 | Discharge: 2023-02-18 | Disposition: A | Discharge status: Home / Self Care | Payer: BC Managed Care – PPO | Source: Ambulatory Visit | Attending: Orthopedic Surgery | Admitting: Orthopedic Surgery

## 2023-02-18 ENCOUNTER — Encounter (HOSPITAL_COMMUNITY): Payer: Self-pay

## 2023-02-18 DIAGNOSIS — Z01812 Encounter for preprocedural laboratory examination: Secondary | ICD-10-CM | POA: Diagnosis not present

## 2023-02-18 DIAGNOSIS — Z01818 Encounter for other preprocedural examination: Secondary | ICD-10-CM

## 2023-02-18 LAB — SURGICAL PCR SCREEN
MRSA, PCR: NEGATIVE
Staphylococcus aureus: NEGATIVE

## 2023-02-18 NOTE — Pre-Procedure Instructions (Signed)
Surgical Instructions    Your procedure is scheduled on Thursday, February 27, 2023.  Report to [facility] Main Entrance "A" at 0830 A.M., then check in with the Admitting office.  Call this number if you have problems the morning of surgery:  [phone]   If you have any questions prior to your surgery date call [phone]: Open Monday-Friday 8am-4pm If you experience any cold or flu symptoms such as cough, fever, chills, shortness of breath, etc. between now and your scheduled surgery, please notify us at the above number     Remember:  Do not eat after midnight the night before your surgery-Wednesday  You may drink clear liquids until 07:30 the morning of your surgery.   Clear liquids allowed are: Water, Non-Citrus Juices (without pulp), Carbonated Beverages, Clear Tea, Black Coffee ONLY (NO MILK, CREAM OR POWDERED CREAMER of any kind), and Gatorade    Take these medicines the morning of surgery with A SIP OF WATER:  BREO ELLIPTA 100-25 MCG/ACT AEPB  esomeprazole (NEXIUM)  famotidine (PEPCID) fexofenadine (ALLEGRA)  fluticasone (FLONASE) 50 MCG/ACT nasal spray  gabapentin (NEURONTIN)  levothyroxine (SYNTHROID)   IF needed: acetaminophen (TYLENOL)  albuterol (VENTOLIN HFA) 108 (90 Base) MCG/ACT inhaler - Please bring all inhalers with you the day of surgery.  clonazePAM (KLONOPIN)  SUMAtriptan (IMITREX)   As of today, STOP taking any Aspirin (unless otherwise instructed by your surgeon) Aleve, Naproxen, Ibuprofen, Motrin, Advil, Goody's, BC's, all herbal medications, fish oil, and all vitamins.   [facility] is not responsible for any belongings or valuables.     Contacts, glasses, hearing aids, dentures or partials may not be worn into surgery, please bring cases for these belongings   For patients admitted to the hospital, discharge time will be determined by your treatment team.    SURGICAL WAITING ROOM VISITATION Patients having surgery or a procedure may have no  more than 2 support people in the waiting area - these visitors may rotate.   Children under the age of 39 must have an adult with them who is not the patient. If the patient needs to stay at the hospital during part of their recovery, the visitor guidelines for inpatient rooms apply. Pre-op nurse will coordinate an appropriate time for 1 support person to accompany patient in pre-op.  This support person may not rotate.   Please refer to [email or web address] for the visitor guidelines for Inpatients (after your surgery is over and you are in a regular room).    Special instructions:    Oral Hygiene is also important to reduce your risk of infection.  Remember - BRUSH YOUR TEETH THE MORNING OF SURGERY WITH YOUR REGULAR TOOTHPASTE     Pre-operative 5 CHG Bath Instructions   You can play a key role in reducing the risk of infection after surgery. Your skin needs to be as free of germs as possible. You can reduce the number of germs on your skin by washing with CHG (chlorhexidine gluconate) soap before surgery. CHG is an antiseptic soap that kills germs and continues to kill germs even after washing.   DO NOT use if you have an allergy to chlorhexidine/CHG or antibacterial soaps. If your skin becomes reddened or irritated, stop using the CHG and notify one of our RNs at [phone].   Please shower with the CHG soap starting 4 days before surgery using the following schedule:     Please keep in mind the following:  DO NOT shave, including legs and underarms, starting the  day of your first shower.   You may shave your face at any point before/day of surgery.  Place clean sheets on your bed the day you start using CHG soap. Use a clean washcloth (not used since being washed) for each shower. DO NOT sleep with pets once you start using the CHG.   CHG Shower Instructions:  If you choose to wash your hair and private area, wash first with  your normal shampoo/soap.  After you use shampoo/soap, rinse your hair and body thoroughly to remove shampoo/soap residue.  Turn the water OFF and apply about 3 tablespoons (45 ml) of CHG soap to a CLEAN washcloth.  Apply CHG soap ONLY FROM YOUR NECK DOWN TO YOUR TOES (washing for 3-5 minutes)  DO NOT use CHG soap on face, private areas, open wounds, or sores.  Pay special attention to the area where your surgery is being performed.  If you are having back surgery, having someone wash your back for you may be helpful. Wait 2 minutes after CHG soap is applied, then you may rinse off the CHG soap.  Pat dry with a clean towel  Put on clean clothes/pajamas   If you choose to wear lotion, please use ONLY the CHG-compatible lotions on the back of this paper.     Additional instructions for the day of surgery: DO NOT APPLY any lotions, deodorants, cologne, or perfumes.   Put on clean/comfortable clothes.  Brush your teeth.  Ask your nurse before applying any prescription medications to the skin.          Do not wear jewelry or makeup. Men may shave face and neck. Do not bring valuables to the hospital. Do not wear nail polish, gel polish, artificial nails, or any other type of covering on natural nails (fingers and toes) If you have artificial nails or gel coating that need to be removed by a nail salon, please have this removed prior to surgery. Artificial nails or gel coating may interfere with anesthesia's ability to adequately monitor your vital signs.     CHG Compatible Lotions   Aveeno Moisturizing lotion  Cetaphil Moisturizing Cream  Cetaphil Moisturizing Lotion  Clairol Herbal Essence Moisturizing Lotion, Dry Skin  Clairol Herbal Essence Moisturizing Lotion, Extra Dry Skin  Clairol Herbal Essence Moisturizing Lotion, Normal Skin  Curel Age Defying Therapeutic Moisturizing Lotion with Alpha Hydroxy  Curel Extreme Care Body Lotion  Curel Soothing Hands Moisturizing Hand Lotion   Curel Therapeutic Moisturizing Cream, Fragrance-Free  Curel Therapeutic Moisturizing Lotion, Fragrance-Free  Curel Therapeutic Moisturizing Lotion, Original Formula  Eucerin Daily Replenishing Lotion  Eucerin Dry Skin Therapy Plus Alpha Hydroxy Crme  Eucerin Dry Skin Therapy Plus Alpha Hydroxy Lotion  Eucerin Original Crme  Eucerin Original Lotion  Eucerin Plus Crme Eucerin Plus Lotion  Eucerin TriLipid Replenishing Lotion  Keri Anti-Bacterial Hand Lotion  Keri Deep Conditioning Original Lotion Dry Skin Formula Softly Scented  Keri Deep Conditioning Original Lotion, Fragrance Free Sensitive Skin Formula  Keri Lotion Fast Absorbing Fragrance Free Sensitive Skin Formula  Keri Lotion Fast Absorbing Softly Scented Dry Skin Formula  Keri Original Lotion  Keri Skin Renewal Lotion Keri Silky Smooth Lotion  Keri Silky Smooth Sensitive Skin Lotion  Nivea Body Creamy Conditioning Oil  Nivea Body Extra Enriched Teacher, adult education Moisturizing Lotion Nivea Crme  Nivea Skin Firming Lotion  NutraDerm 30 Skin Lotion  NutraDerm Skin Lotion  NutraDerm Therapeutic Skin Cream  NutraDerm Therapeutic Skin Lotion  ProShield  Protective Hand Cream  Provon moisturizing lotion    If you received a COVID test during your pre-op visit, it is requested that you wear a mask when out in public, stay away from anyone that may not be feeling well, and notify your surgeon if you develop symptoms. If you have been in contact with anyone that has tested positive in the last 10 days, please notify your surgeon.    Please read over the following fact sheets that you were given.

## 2023-02-19 DIAGNOSIS — F3132 Bipolar disorder, current episode depressed, moderate: Secondary | ICD-10-CM | POA: Diagnosis not present

## 2023-02-20 DIAGNOSIS — F411 Generalized anxiety disorder: Secondary | ICD-10-CM | POA: Diagnosis not present

## 2023-02-20 DIAGNOSIS — F3111 Bipolar disorder, current episode manic without psychotic features, mild: Secondary | ICD-10-CM | POA: Diagnosis not present

## 2023-02-26 NOTE — Progress Notes (Signed)
Patient called this department saying the she is confused about her surgery time for tomorrow. Patient was informed that her surgery is scheduled for 07:30 o'clock but she must be at the hospital at 05:30 o'clock. Patient verbalized understanding.

## 2023-02-27 ENCOUNTER — Ambulatory Visit (HOSPITAL_COMMUNITY): Payer: BC Managed Care – PPO

## 2023-02-27 ENCOUNTER — Other Ambulatory Visit: Payer: Self-pay

## 2023-02-27 ENCOUNTER — Ambulatory Visit (HOSPITAL_COMMUNITY)
Admission: RE | Admit: 2023-02-27 | Discharge: 2023-02-27 | Disposition: A | Discharge status: Home / Self Care | Payer: BC Managed Care – PPO | Attending: Orthopedic Surgery | Admitting: Orthopedic Surgery

## 2023-02-27 ENCOUNTER — Ambulatory Visit (HOSPITAL_COMMUNITY): Payer: BC Managed Care – PPO | Admitting: Certified Registered Nurse Anesthetist

## 2023-02-27 ENCOUNTER — Encounter (HOSPITAL_COMMUNITY)
Admission: RE | Disposition: A | Discharge status: Home / Self Care | Payer: Self-pay | Source: Home / Self Care | Attending: Orthopedic Surgery

## 2023-02-27 ENCOUNTER — Encounter (HOSPITAL_COMMUNITY): Payer: Self-pay | Admitting: Orthopedic Surgery

## 2023-02-27 DIAGNOSIS — M1990 Unspecified osteoarthritis, unspecified site: Secondary | ICD-10-CM | POA: Insufficient documentation

## 2023-02-27 DIAGNOSIS — Z6834 Body mass index (BMI) 34.0-34.9, adult: Secondary | ICD-10-CM | POA: Insufficient documentation

## 2023-02-27 DIAGNOSIS — E669 Obesity, unspecified: Secondary | ICD-10-CM | POA: Diagnosis not present

## 2023-02-27 DIAGNOSIS — F319 Bipolar disorder, unspecified: Secondary | ICD-10-CM | POA: Insufficient documentation

## 2023-02-27 DIAGNOSIS — E039 Hypothyroidism, unspecified: Secondary | ICD-10-CM | POA: Insufficient documentation

## 2023-02-27 DIAGNOSIS — Z981 Arthrodesis status: Secondary | ICD-10-CM | POA: Insufficient documentation

## 2023-02-27 DIAGNOSIS — K219 Gastro-esophageal reflux disease without esophagitis: Secondary | ICD-10-CM | POA: Insufficient documentation

## 2023-02-27 DIAGNOSIS — M961 Postlaminectomy syndrome, not elsewhere classified: Secondary | ICD-10-CM | POA: Diagnosis not present

## 2023-02-27 DIAGNOSIS — Z01818 Encounter for other preprocedural examination: Secondary | ICD-10-CM

## 2023-02-27 DIAGNOSIS — Z4542 Encounter for adjustment and management of neuropacemaker (brain) (peripheral nerve) (spinal cord): Secondary | ICD-10-CM | POA: Diagnosis not present

## 2023-02-27 DIAGNOSIS — Z9682 Presence of neurostimulator: Secondary | ICD-10-CM | POA: Diagnosis not present

## 2023-02-27 DIAGNOSIS — R519 Headache, unspecified: Secondary | ICD-10-CM | POA: Insufficient documentation

## 2023-02-27 DIAGNOSIS — J45909 Unspecified asthma, uncomplicated: Secondary | ICD-10-CM | POA: Diagnosis not present

## 2023-02-27 HISTORY — PX: SPINAL CORD STIMULATOR INSERTION: SHX5378

## 2023-02-27 LAB — POCT PREGNANCY, URINE: Preg Test, Ur: NEGATIVE

## 2023-02-27 SURGERY — INSERTION, SPINAL CORD STIMULATOR, LUMBAR
Anesthesia: General | Site: Spine Thoracic

## 2023-02-27 MED ORDER — ONDANSETRON HCL 4 MG PO TABS: 4.0000 mg | ORAL_TABLET | Freq: Three times a day (TID) | ORAL | 0 refills | Status: DC | PRN

## 2023-02-27 MED ORDER — ONDANSETRON HCL 4 MG/2ML IJ SOLN
INTRAMUSCULAR | Status: DC | PRN
Start: 2023-02-27 — End: 2023-02-27
  Administered 2023-02-27: 4 mg via INTRAVENOUS

## 2023-02-27 MED ORDER — TRANEXAMIC ACID-NACL 1000-0.7 MG/100ML-% IV SOLN
INTRAVENOUS | Status: DC | PRN
  Administered 2023-02-27: 1000 mg via INTRAVENOUS

## 2023-02-27 MED ORDER — THROMBIN 20000 UNITS EX KIT
PACK | CUTANEOUS | Status: DC | PRN
  Administered 2023-02-27: 20 mL via TOPICAL

## 2023-02-27 MED ORDER — SURGIFLO WITH THROMBIN (HEMOSTATIC MATRIX KIT) OPTIME
TOPICAL | Status: DC | PRN
  Administered 2023-02-27: 1 via TOPICAL

## 2023-02-27 MED ORDER — DEXMEDETOMIDINE HCL IN NACL 80 MCG/20ML IV SOLN
INTRAVENOUS | Status: DC | PRN
  Administered 2023-02-27: 8 ug via INTRAVENOUS
  Administered 2023-02-27: 12 ug via INTRAVENOUS

## 2023-02-27 MED ORDER — EPHEDRINE SULFATE-NACL 50-0.9 MG/10ML-% IV SOSY
PREFILLED_SYRINGE | INTRAVENOUS | Status: DC | PRN
  Administered 2023-02-27: 5 mg via INTRAVENOUS

## 2023-02-27 MED ORDER — FENTANYL CITRATE (PF) 250 MCG/5ML IJ SOLN
INTRAMUSCULAR | Status: DC | PRN
  Administered 2023-02-27 (×3): 50 ug via INTRAVENOUS
  Administered 2023-02-27: 100 ug via INTRAVENOUS

## 2023-02-27 MED ORDER — LIDOCAINE 2% (20 MG/ML) 5 ML SYRINGE
INTRAMUSCULAR | Status: AC
  Filled 2023-02-27: qty 5

## 2023-02-27 MED ORDER — DEXAMETHASONE SODIUM PHOSPHATE 10 MG/ML IJ SOLN
INTRAMUSCULAR | Status: AC
  Filled 2023-02-27: qty 1

## 2023-02-27 MED ORDER — OXYCODONE HCL 5 MG PO TABS
5.0000 mg | ORAL_TABLET | Freq: Once | ORAL | Status: AC | PRN
  Administered 2023-02-27: 5 mg via ORAL

## 2023-02-27 MED ORDER — SUCCINYLCHOLINE CHLORIDE 200 MG/10ML IV SOSY
PREFILLED_SYRINGE | INTRAVENOUS | Status: AC
  Filled 2023-02-27: qty 10

## 2023-02-27 MED ORDER — SUGAMMADEX SODIUM 200 MG/2ML IV SOLN
INTRAVENOUS | Status: DC | PRN
  Administered 2023-02-27: 200 mg via INTRAVENOUS

## 2023-02-27 MED ORDER — CEFAZOLIN SODIUM-DEXTROSE 2-4 GM/100ML-% IV SOLN
INTRAVENOUS | Status: AC
  Filled 2023-02-27: qty 100

## 2023-02-27 MED ORDER — TRANEXAMIC ACID-NACL 1000-0.7 MG/100ML-% IV SOLN
INTRAVENOUS | Status: AC
  Filled 2023-02-27: qty 100

## 2023-02-27 MED ORDER — LIDOCAINE 2% (20 MG/ML) 5 ML SYRINGE
INTRAMUSCULAR | Status: DC | PRN
  Administered 2023-02-27: 100 mg via INTRAVENOUS

## 2023-02-27 MED ORDER — DEXMEDETOMIDINE HCL IN NACL 80 MCG/20ML IV SOLN
INTRAVENOUS | Status: AC
  Filled 2023-02-27: qty 20

## 2023-02-27 MED ORDER — MEPERIDINE HCL 25 MG/ML IJ SOLN: 6.2500 mg | INTRAMUSCULAR | Status: DC | PRN

## 2023-02-27 MED ORDER — ONDANSETRON HCL 4 MG/2ML IJ SOLN
INTRAMUSCULAR | Status: AC
  Filled 2023-02-27: qty 2

## 2023-02-27 MED ORDER — ORAL CARE MOUTH RINSE: 15.0000 mL | Freq: Once | OROMUCOSAL | Status: AC

## 2023-02-27 MED ORDER — CHLORHEXIDINE GLUCONATE 0.12 % MT SOLN
15.0000 mL | Freq: Once | OROMUCOSAL | Status: AC
  Administered 2023-02-27: 15 mL via OROMUCOSAL
  Filled 2023-02-27: qty 15

## 2023-02-27 MED ORDER — DEXAMETHASONE SODIUM PHOSPHATE 10 MG/ML IJ SOLN
INTRAMUSCULAR | Status: DC | PRN
  Administered 2023-02-27: 10 mg via INTRAVENOUS

## 2023-02-27 MED ORDER — PHENYLEPHRINE 80 MCG/ML (10ML) SYRINGE FOR IV PUSH (FOR BLOOD PRESSURE SUPPORT)
PREFILLED_SYRINGE | INTRAVENOUS | Status: AC
  Filled 2023-02-27: qty 10

## 2023-02-27 MED ORDER — OXYCODONE HCL 5 MG/5ML PO SOLN: 5.0000 mg | Freq: Once | ORAL | Status: AC | PRN

## 2023-02-27 MED ORDER — METHOCARBAMOL 500 MG PO TABS: 500.0000 mg | ORAL_TABLET | Freq: Three times a day (TID) | ORAL | 0 refills | Status: AC | PRN

## 2023-02-27 MED ORDER — PROPOFOL 10 MG/ML IV BOLUS
INTRAVENOUS | Status: DC | PRN
  Administered 2023-02-27: 160 mg via INTRAVENOUS

## 2023-02-27 MED ORDER — THROMBIN 20000 UNITS EX SOLR
CUTANEOUS | Status: AC
  Filled 2023-02-27: qty 20000

## 2023-02-27 MED ORDER — LACTATED RINGERS IV SOLN: INTRAVENOUS | Status: DC

## 2023-02-27 MED ORDER — HYDROMORPHONE HCL 1 MG/ML IJ SOLN
0.2500 mg | INTRAMUSCULAR | Status: DC | PRN
  Administered 2023-02-27: 0.25 mg via INTRAVENOUS

## 2023-02-27 MED ORDER — SCOPOLAMINE 1 MG/3DAYS TD PT72
MEDICATED_PATCH | TRANSDERMAL | Status: DC | PRN
  Administered 2023-02-27: 1 via TRANSDERMAL

## 2023-02-27 MED ORDER — PROMETHAZINE HCL 25 MG/ML IJ SOLN: 6.2500 mg | INTRAMUSCULAR | Status: DC | PRN

## 2023-02-27 MED ORDER — SCOPOLAMINE 1 MG/3DAYS TD PT72
MEDICATED_PATCH | TRANSDERMAL | Status: AC
  Filled 2023-02-27: qty 1

## 2023-02-27 MED ORDER — CEFAZOLIN SODIUM-DEXTROSE 2-3 GM-%(50ML) IV SOLR
INTRAVENOUS | Status: DC | PRN
  Administered 2023-02-27: 2 g via INTRAVENOUS

## 2023-02-27 MED ORDER — OXYCODONE HCL 5 MG PO TABS
ORAL_TABLET | ORAL | Status: AC
  Filled 2023-02-27: qty 1

## 2023-02-27 MED ORDER — PROPOFOL 10 MG/ML IV BOLUS
INTRAVENOUS | Status: AC
  Filled 2023-02-27: qty 20

## 2023-02-27 MED ORDER — MIDAZOLAM HCL 2 MG/2ML IJ SOLN
INTRAMUSCULAR | Status: DC | PRN
  Administered 2023-02-27: 2 mg via INTRAVENOUS

## 2023-02-27 MED ORDER — SUCCINYLCHOLINE CHLORIDE 200 MG/10ML IV SOSY
PREFILLED_SYRINGE | INTRAVENOUS | Status: DC | PRN
  Administered 2023-02-27: 120 mg via INTRAVENOUS

## 2023-02-27 MED ORDER — MIDAZOLAM HCL 2 MG/2ML IJ SOLN
INTRAMUSCULAR | Status: AC
  Filled 2023-02-27: qty 2

## 2023-02-27 MED ORDER — 0.9 % SODIUM CHLORIDE (POUR BTL) OPTIME
TOPICAL | Status: DC | PRN
  Administered 2023-02-27 (×2): 1000 mL

## 2023-02-27 MED ORDER — HYDROMORPHONE HCL 1 MG/ML IJ SOLN
INTRAMUSCULAR | Status: AC
  Filled 2023-02-27: qty 1

## 2023-02-27 MED ORDER — OXYCODONE-ACETAMINOPHEN 10-325 MG PO TABS: 1.0000 | ORAL_TABLET | Freq: Four times a day (QID) | ORAL | 0 refills | Status: AC | PRN

## 2023-02-27 MED ORDER — BUPIVACAINE-EPINEPHRINE (PF) 0.25% -1:200000 IJ SOLN
INTRAMUSCULAR | Status: AC
  Filled 2023-02-27: qty 30

## 2023-02-27 MED ORDER — EPHEDRINE 5 MG/ML INJ
INTRAVENOUS | Status: AC
  Filled 2023-02-27: qty 5

## 2023-02-27 MED ORDER — PHENYLEPHRINE 80 MCG/ML (10ML) SYRINGE FOR IV PUSH (FOR BLOOD PRESSURE SUPPORT)
PREFILLED_SYRINGE | INTRAVENOUS | Status: DC | PRN
  Administered 2023-02-27: 160 ug via INTRAVENOUS

## 2023-02-27 MED ORDER — ROCURONIUM BROMIDE 10 MG/ML (PF) SYRINGE
PREFILLED_SYRINGE | INTRAVENOUS | Status: AC
  Filled 2023-02-27: qty 10

## 2023-02-27 MED ORDER — BUPIVACAINE-EPINEPHRINE 0.25% -1:200000 IJ SOLN
INTRAMUSCULAR | Status: DC | PRN
  Administered 2023-02-27: 20 mL

## 2023-02-27 MED ORDER — FENTANYL CITRATE (PF) 250 MCG/5ML IJ SOLN
INTRAMUSCULAR | Status: AC
  Filled 2023-02-27: qty 5

## 2023-02-27 MED ORDER — ROCURONIUM BROMIDE 10 MG/ML (PF) SYRINGE
PREFILLED_SYRINGE | INTRAVENOUS | Status: DC | PRN
  Administered 2023-02-27: 20 mg via INTRAVENOUS
  Administered 2023-02-27: 30 mg via INTRAVENOUS
  Administered 2023-02-27: 20 mg via INTRAVENOUS

## 2023-02-27 SURGICAL SUPPLY — 65 items
AGENT HMST KT MTR STRL THRMB (HEMOSTASIS) ×1
ANCH LD SWIFT-LCK ×2 IMPLANT
ANCHOR SWIFT LOCK IMPLANT
BAG COUNTER SPONGE SURGICOUNT (BAG) IMPLANT
BAG SPNG CNTER NS LX DISP (BAG) ×1
CANISTER SUCT 3000ML PPV (MISCELLANEOUS) ×1 IMPLANT
CHARGER BATTERY NEUROSTIM ABT (NEUROSURGERY SUPPLIES) IMPLANT
CLSR STERI-STRIP ANTIMIC 1/2X4 (GAUZE/BANDAGES/DRESSINGS) ×1 IMPLANT
CONTROLLER NEUROSTIM PATIENT (NEUROSURGERY SUPPLIES) IMPLANT
COVER MAYO STAND STRL (DRAPES) ×1 IMPLANT
COVER PROBE W GEL 5X96 (DRAPES) IMPLANT
COVER SURGICAL LIGHT HANDLE (MISCELLANEOUS) ×1 IMPLANT
DRAPE C-ARM 42X72 X-RAY (DRAPES) ×1 IMPLANT
DRAPE C-ARMOR (DRAPES) IMPLANT
DRAPE SURG 17X23 STRL (DRAPES) ×1 IMPLANT
DRAPE U-SHAPE 47X51 STRL (DRAPES) ×1 IMPLANT
DRSG OPSITE POSTOP 4X6 (GAUZE/BANDAGES/DRESSINGS) ×1 IMPLANT
DURAPREP 26ML APPLICATOR (WOUND CARE) ×1 IMPLANT
ELECT BLADE 4.0 EZ CLEAN MEGAD (MISCELLANEOUS) ×1
ELECT CAUTERY BLADE 6.4 (BLADE) IMPLANT
ELECT PENCIL ROCKER SW 15FT (MISCELLANEOUS) ×1 IMPLANT
ELECT REM PT RETURN 9FT ADLT (ELECTROSURGICAL) ×1
ELECTRODE BLDE 4.0 EZ CLN MEGD (MISCELLANEOUS) IMPLANT
ELECTRODE REM PT RTRN 9FT ADLT (ELECTROSURGICAL) ×1 IMPLANT
GENERATOR NEUROSTIM ETERNA 16 (Generator) IMPLANT
GLOVE BIO SURGEON STRL SZ7 (GLOVE) ×1 IMPLANT
GLOVE BIOGEL PI IND STRL 7.0 (GLOVE) ×1 IMPLANT
GLOVE BIOGEL PI IND STRL 8.5 (GLOVE) ×1 IMPLANT
GLOVE SS N UNI LF 8.5 STRL (GLOVE) ×1 IMPLANT
GOWN STRL REUS W/ TWL LRG LVL3 (GOWN DISPOSABLE) ×2 IMPLANT
GOWN STRL REUS W/TWL 2XL LVL3 (GOWN DISPOSABLE) ×1 IMPLANT
GOWN STRL REUS W/TWL LRG LVL3 (GOWN DISPOSABLE) ×2
KIT BASIN OR (CUSTOM PROCEDURE TRAY) ×1 IMPLANT
KIT CHARGER NEUROSTIM ABT (NEUROSURGERY SUPPLIES) IMPLANT
KIT TURNOVER KIT B (KITS) ×1 IMPLANT
LEAD OCTRODE GEN 8CH 60CM (Lead) IMPLANT
MAGNET NEUROSTIM ETERNA PAT (NEUROSURGERY SUPPLIES) IMPLANT
MANUAL PATIENT NEUROSTIM DISP (MISCELLANEOUS) IMPLANT
NDL 22X1.5 STRL (OR ONLY) (MISCELLANEOUS) ×1 IMPLANT
NDL SPNL 18GX3.5 QUINCKE PK (NEEDLE) ×1 IMPLANT
NEEDLE 22X1.5 STRL (OR ONLY) (MISCELLANEOUS) ×1
NEEDLE SPNL 18GX3.5 QUINCKE PK (NEEDLE)
NS IRRIG 1000ML POUR BTL (IV SOLUTION) ×1 IMPLANT
PACK LAMINECTOMY ORTHO (CUSTOM PROCEDURE TRAY) ×1 IMPLANT
PACK UNIVERSAL I (CUSTOM PROCEDURE TRAY) ×1 IMPLANT
PAD ARMBOARD 7.5X6 YLW CONV (MISCELLANEOUS) ×3 IMPLANT
SPATULA SILICONE BRAIN 10MM (MISCELLANEOUS) IMPLANT
SPONGE SURGIFOAM ABS GEL 100 (HEMOSTASIS) ×1 IMPLANT
SPONGE T-LAP 4X18 ~~LOC~~+RFID (SPONGE) IMPLANT
STAPLER VISISTAT 35W (STAPLE) IMPLANT
SURGIFLO W/THROMBIN 8M KIT (HEMOSTASIS) ×1 IMPLANT
SUT BONE WAX W31G (SUTURE) ×1 IMPLANT
SUT ETHIBOND 2 OS 4 DA (SUTURE) ×1 IMPLANT
SUT ETHIBOND 4 0 TF (SUTURE) IMPLANT
SUT MNCRL AB 3-0 PS2 18 (SUTURE) ×2 IMPLANT
SUT VIC AB 1 CT1 18XCR BRD 8 (SUTURE) ×2 IMPLANT
SUT VIC AB 1 CT1 8-18 (SUTURE)
SUT VIC AB 2-0 CT1 18 (SUTURE) ×1 IMPLANT
SYR BULB IRRIG 60ML STRL (SYRINGE) ×1 IMPLANT
SYR CONTROL 10ML LL (SYRINGE) ×1 IMPLANT
TOOL TUNNELING 20 (MISCELLANEOUS) IMPLANT
TOWEL GREEN STERILE (TOWEL DISPOSABLE) ×1 IMPLANT
TOWEL GREEN STERILE FF (TOWEL DISPOSABLE) ×1 IMPLANT
WATER STERILE IRR 1000ML POUR (IV SOLUTION) ×1 IMPLANT
YANKAUER SUCT BULB TIP NO VENT (SUCTIONS) ×1 IMPLANT

## 2023-02-27 NOTE — Discharge Instructions (Signed)
Spinal Cord Stimulator After Care This sheet gives you information about how to care for yourself after your procedure. Your health care provider may also give you more specific instructions. If you have problems or questions, contact your health care provider. What can I expect after the procedure? After the procedure, it is common to have: Soreness or pain. Some swelling in the area where the hardware was removed. A small amount of blood or clear fluid coming from your incision. Follow these instructions at home: If you have a cast: Do not stick anything inside the cast to scratch your skin. Doing that increases your risk of infection. Check the skin around the cast every day. Tell your health care provider about any concerns. You may put lotion on dry skin around the edges of the cast. Do not put lotion on the skin underneath the cast. Keep the cast clean and dry. Do not take baths, swim, or use a hot tub until your health care provider approves. Ask your health care provider if you may take showers. You may only be allowed to take sponge baths. Keep the bandage (dressing) dry until your health care provider says it can be removed. Ok to shower in 5 days.    Incision care  Follow instructions from your health care provider about how to take care of your incision. Make sure you: Wash your hands with soap and water before you change your dressing. If soap and water are not available, use hand sanitizer. Change your dressing as told by your health care provider. Leave stitches (sutures), skin glue, or adhesive strips in place. These skin closures may need to stay in place for 2 weeks or longer. If adhesive strip edges start to loosen and curl up, you may trim the loose edges. Do not remove adhesive strips completely unless your health care provider tells you to do that. Check your incision area every day for signs of infection. Check for: Redness. More swelling or pain. More fluid or  blood. Warmth. Pus or a bad smell. Managing pain, stiffness, and swelling  If directed, put ice on the affected area: Put ice in a plastic bag. Place a towel between your skin and the bag. Leave the ice on for 20 minutes, 2-3 times a day. Move your fingers or toes often to avoid stiffness and to lessen swelling.  Driving Do not drive or use heavy machinery while taking prescription pain medicine. Do not drive for 24 hours if you were given a medicine to help you relax (sedative) during your procedure. Ask your health care provider when it is safe to drive if you have a cast, splint, or boot on the affected limb. Activity Ask your health care provider what activities are safe for you during recovery, and ask what activities you need to avoid. Do not use the injured limb to support your body weight until your health care provider says that you can. Do not play contact sports until your health care provider approves. Do exercises as told by your health care provider. Avoid sitting for a long time without moving. Get up and move around at least every few hours. This will help prevent blood clots. General instructions Do not put pressure on any part of the cast or splint until it is fully hardened. This may take several hours. If you are taking prescription pain medicine, take actions to prevent or treat constipation. Your health care provider may recommend that you: Drink enough fluid to keep your urine  urine pale yellow. Eat foods that are high in fiber, such as fresh fruits and vegetables, whole grains, and beans. Limit foods that are high in fat and processed sugars, such as fried or sweet foods. Take an over-the-counter or prescription medicine for constipation. Do not use any products that contain nicotine or tobacco, such as cigarettes and e-cigarettes. These can delay bone healing after surgery. If you need help quitting, ask your health care provider. Take over-the-counter and prescription  medicines only as told by your health care provider. Keep all follow-up visits as told by your health care provider. This is important. Contact a health care provider if: You have lasting pain. You have redness around your incision. You have more swelling or pain around your incision. You have more fluid or blood coming from your incision. Your incision feels warm to the touch. You have pus or a bad smell coming from your incision. You are unable to do exercises or physical activity as told by your health care provider. Get help right away if: You have difficulty breathing. You have chest pain. You have severe pain. You have a fever or chills. You have numbness for more than 24 hours in the area where the hardware was removed. Summary After the procedure, it is common to have some pain and swelling in the area where the hardware was removed. Follow instructions from your health care provider about how to take care of your incision. Return to your normal activities as told by your health care provider. Ask your health care provider what activities are safe for you. This information is not intended to replace advice given to you by your health care provider. Make sure you discuss any questions you have with your health care provider. 

## 2023-02-27 NOTE — Brief Op Note (Signed)
02/27/2023  10:11 AM  PATIENT:  Sheila Mathews  47 y.o. female  PRE-OPERATIVE DIAGNOSIS:  Lumbar post-laminectomy syndrome; status post successful spinal cord stimulator trial  POST-OPERATIVE DIAGNOSIS:  Lumbar post-laminectomy syndrome; status post successful spinal cord stimulator trial  PROCEDURE:  Procedure(s) with comments: LUMBAR SPINAL CORD STIMULATOR INSERTION (N/A) -  SURGEON:  Surgeons and Role:    Venita Lick, MD - Primary  PHYSICIAN ASSISTANT:   ASSISTANTS: none   ANESTHESIA:   general  EBL:  50 mL   BLOOD ADMINISTERED:none  DRAINS: none   LOCAL MEDICATIONS USED:  MARCAINE     SPECIMEN:  No Specimen  DISPOSITION OF SPECIMEN:  N/A  COUNTS:  YES  TOURNIQUET:  * No tourniquets in log *  DICTATION: .Dragon Dictation  PLAN OF CARE: Admit for overnight observation  PATIENT DISPOSITION:  PACU - hemodynamically stable.

## 2023-02-27 NOTE — Op Note (Signed)
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  POST-OPERATIVE DIAGNOSIS: Same  PROCEDURE:   Spinal cord stimulator implantation  SURGEON:  Venita Lick, MD  PHYSICIAN ASSISTANT: None  ANESTHESIA:   General  EBL: 50 ml   Complications: None  Implants: Abbott spine: octrode lead x2.  Eterna pulse generator  BRIEF HISTORY: Sheila Mathews is a 47 y.o. female who has had prior lumbar fusion surgery and unfortunately has can ongoing back buttock and neuropathic leg pain.  She recently had a spinal cord stimulator placed and noted significant improvement in her pain and quality of life.  As result of the successful trial we elected to move forward with permanent implantation.  All appropriate risks, benefits, and alternatives were discussed and consent was obtained  PROCEDURE DETAILS: Patient was brought into the operating room and was properly positioned on the operating room table.  After induction with general anesthesia the patient was endotracheally intubated.  A timeout was taken to confirm all important data: including patient, procedure, and the level. Teds, SCD's were applied.   The patient was then placed prone onto the Wilson frame and all bony prominences were well-padded.  The back was then prepped and draped in a standard fashion preoperatively hide marked out the laminotomy site and the battery site and both of these areas were infiltrated with quarter percent Marcaine.  Using fluoroscopy I confirmed the T9 pedicle and T9-10 disc space.  I centered my incision over this area.  Per the Abbott spine wrap the area of maximum coverage was mid body of T7-T9.  As a result I elected to perform a T9 laminotomy in order to pass the leads over the T7/T8 vertebral bodies.  The  incision site was infiltrated with quarter percent Marcaine in the midline incision was made.  Sharp dissection was carried out down to the deep fascia.  The deep fascia was sharply incised and I dissected down to the deep fascia.  Deep fascia was mobilized and expose the posterior aspect of T9, and T10.  Using fluoroscopy I confirmed the T9-10 disc space and the T9 pedicle.  Once I confirmed I was at the appropriate level I used a double-action Leksell rongeur to remove the inferior third of the spinous process of T9.  I then used a 2 and 3 mm Kerrison rongeur to perform a laminotomy of T9.  I then dissected gently through the central raphae of the ligamentum flavum and used a 2 mm and 1 mm Kerrison rongeur to resect the ligamentum flavum and expose the dorsal epidural fat.  Once I had an adequate exposure I then obtained the first octrode lead.  The lead was then gently advanced under the T9 lamina using fluoroscopic guidance.  I was able to easily pass the lead up to the midportion of the T7 vertebral body.  AP and lateral fluoroscopy images demonstrate satisfactory positioning of the lead.  The Abbott spine rep also confirmed that it was properly positioned.  A second lead was then placed just to the left of the wrist.  I again was able to easily advance the lead to the appropriate level.  Imaging confirmed satisfactory positioning in both the AP and lateral planes.  I also confirmed with the Abbott rep that the leads were properly positioned to obtain maximum coverage and  similar results as to the trial.  The Swift lock lead holder was then placed and the leads were then directly sutured to the T10 spinous process with a #1 Ethibond suture.  With the leads secured to the bone I then dissected through the T10/11 interspinous ligament and then wrapped the leads.  I then prepared the interspinous ligament with a #1 Ethibond suture.  At this point the leads were now secured directly to the T10 spinous process and  around the T10 spinous process.  A second incision was made and sharp dissection was carried out to create a pocket approximately 2-1/2 cm deep for the battery.  Once this was created I then passed the leads from the thoracic wound to the site.  The leads were then inserted into the battery and locked in place according manufacture standards.  I then placed the battery into the pocket and coiled the remaining portion of excess lead on the undersurface of the battery.  The battery was then secured to the deep fascia with two #1 Ethibond sutures.  With the device properly situated it was then tested and noted to be functioning appropriately.  Both wounds were now copiously irrigated with normal saline.  Final x-rays demonstrate satisfactory positioning of the leads in both the AP and lateral planes.  Once I confirmed hemostasis I irrigated the wounds again and then closed the thoracic wound with a #1 strata fix suture.  The deep fascia of the battery site was closed with interrupted #1 Vicryl sutures.  Both wounds were then closed with interrupted 2-0 Vicryl sutures and a 3-0 Monocryl for the skin.  Steri-Strips and dry dressings were applied and the patient was ultimately extubated and transferred the PACU without incident.  The end of the case all needle sponge counts were correct.  Venita Lick, MD 02/27/2023 9:59 AM

## 2023-02-27 NOTE — Transfer of Care (Signed)
Immediate Anesthesia Transfer of Care Note  Patient: Sheila Mathews  Procedure(s) Performed: LUMBAR SPINAL CORD STIMULATOR INSERTION (Spine Thoracic)  Patient Location: PACU  Anesthesia Type:General  Level of Consciousness: awake, alert , oriented, patient cooperative, and responds to stimulation  Airway & Oxygen Therapy: Patient Spontanous Breathing and Patient connected to face mask oxygen  Post-op Assessment: Report given to RN, Post -op Vital signs reviewed and stable, and Patient moving all extremities X 4  Post vital signs: Reviewed and stable  Last Vitals:  Vitals Value Taken Time  BP 118/57 02/27/23 1006  Temp    Pulse 79 02/27/23 1011  Resp 16 02/27/23 1011  SpO2 100 % 02/27/23 1011  Vitals shown include unfiled device data.  Last Pain:  Vitals:   02/27/23 0608  TempSrc:   PainSc: 6       Patients Stated Pain Goal: 0 (02/27/23 1610)  Complications: No notable events documented.

## 2023-02-27 NOTE — H&P (Signed)
History: Sheila Mathews is a very pleasant 47 year old woman who has had a SI fusion as well as lumbar fusion. Unfortunately her quality of life has continued to deteriorate. She continues to have back buttock and neuropathic leg pain. She recently had a spinal cord stimulator trial and noted significant improvement (greater than 80%). As result of the successful trial she presents today for definitive implantation.  Past Medical History:  Diagnosis Date   Abnormal cervical Papanicolaou smear 04/20/2020   Allergies    Anemia    with pregnancy , resolved after delivery   Anxiety    Asthma 11/05/2022   mild   Bipolar disorder (HCC)    Chicken pox    Chronic sinusitis 07/26/2016   no current problems as of 12/05/22   Complication of anesthesia    pt doesn't like mask she has panic attack; childhood, no problems as an adult   Depression    Dyspnea    with intermittent chest tightness at times, has used daughter's albuterol inhaler which has helped per patient 12/05/22   Family history of adverse reaction to anesthesia    Mother PONV   GERD (gastroesophageal reflux disease)    Hypothyroidism    Lung nodule 01/2023   pulmonary adenoma   Migraines    Nonsmoker    Pelvic pain in female 05/13/2014   resolved, no current problem as of 12/05/22   Pneumonia    x 1   PONV (postoperative nausea and vomiting)    no issues with short procdures, had one isntance of vomitus following a longer procedure    Sciatica     Allergies  Allergen Reactions   Almond (Diagnostic) Anaphylaxis   Almond Oil Anaphylaxis and Other (See Comments)   Codeine Other (See Comments)    Makes patient aggressive     Hydrocodone Other (See Comments)    Makes patient aggressive     No current facility-administered medications on file prior to encounter.   Current Outpatient Medications on File Prior to Encounter  Medication Sig Dispense Refill   acetaminophen (TYLENOL) 325 MG tablet Take 2 tablets (650 mg total) by  mouth every 4 (four) hours as needed for mild pain or moderate pain.     albuterol (VENTOLIN HFA) 108 (90 Base) MCG/ACT inhaler Inhale 2 puffs into the lungs every 6 (six) hours as needed for wheezing or shortness of breath. 8 g 6   BREO ELLIPTA 100-25 MCG/ACT AEPB Inhale 1 puff into the lungs daily.     clonazePAM (KLONOPIN) 0.5 MG tablet Take 0.5 mg by mouth 3 (three) times daily as needed for anxiety.     EMGALITY 120 MG/ML SOAJ Inject 120 mg into the skin every 28 (twenty-eight) days. 1.12 mL 11   esomeprazole (NEXIUM) 20 MG capsule Take 20 mg by mouth daily.     ferrous sulfate 325 (65 FE) MG tablet Take 325 mg by mouth daily with breakfast.     fluticasone (FLONASE) 50 MCG/ACT nasal spray Place 2 sprays into both nostrils daily.     gabapentin (NEURONTIN) 300 MG capsule Take 300 mg by mouth 3 (three) times daily.     levonorgestrel (MIRENA, 52 MG,) 20 MCG/DAY IUD 1 each by Intrauterine route once. Every 8 Years, Placed 2021     levothyroxine (SYNTHROID) 25 MCG tablet 1 tab daily p.o. on an empty stomach x6 days a week, 2 tabs p.o. 1 day a week. (Patient taking differently: Take 25-50 mcg by mouth See admin instructions. 25 mcg  6 days a week each morning, 50 mcg Sunday morning ONLY) 102 tablet 3   lithium carbonate (LITHOBID) 300 MG ER tablet Take 900 mg by mouth every evening.     OVER THE COUNTER MEDICATION Take 1 Dose by mouth daily as needed (constipation). "Smooth Move tea with senna"     sertraline (ZOLOFT) 100 MG tablet Take 150 mg by mouth at bedtime.     SUMAtriptan (IMITREX) 100 MG tablet Take 1 tablet earliest onset of migraine.  May repeat in 2 hours. Maximum 2 tablets in 24 hours. 10 tablet 11   traZODone (DESYREL) 100 MG tablet Take 100 mg by mouth at bedtime.     hydrocortisone cream 1 % Apply 1 Application topically as needed for itching.      Physical Exam: Vitals:   02/27/23 0603  BP: 113/74  Pulse: 74  Resp: 18  Temp: 97.9 F (36.6 C)  SpO2: 97%   Body mass index  is 34.9 kg/m. Clinical exam: Sheila Mathews is a pleasant individual, who appears younger than their stated age.  She is alert and orientated 3.  No shortness of breath, chest pain.  Abdomen is soft and non-tender, negative loss of bowel and bladder control, no rebound tenderness.  Negative: skin lesions abrasions contusions  Peripheral pulses: 2+ peripheral pulses bilaterally. LE compartments are: Soft and nontender.  Gait pattern: Normal  Assistive devices: None  Neuro: Intermittent dysesthesias and neuropathic leg pain. 5/5 motor strength in the lower extremity. Negative nerve root tension signs, negative Babinski test, and no clonus. 1+ deep tendon reflexes.  Musculoskeletal: Severe back buttock and neuropathic leg pain bilaterally. Decreased range of motion due to pain. Well-healed surgical scar from prior left SI fusion and anterior posterior lumbar fusion.  Imaging: X-rays of the pelvis demonstrate solid fusion of her left SI joint. No hardware migration or subsidence is noted.  Thoracic MRI: completed on 10/31/2022. No thoracic cord signal changes or significant stenosis. No contraindication for implantation of the spinal cord stimulator.   Image: DG Chest 2 View  Result Date: 02/12/2023 CLINICAL DATA:  Lung nodule. Status post left lower lobe wedge resection. EXAM: CHEST - 2 VIEW COMPARISON:  01/15/2023 FINDINGS: Normal heart size. Postop change from left lower lobe wedge resection identified. The no pleural effusion or pneumothorax. The no airspace consolidation identified. Right lung appears clear. IMPRESSION: Stable postop change from left lower lobe wedge resection. No acute findings. Electronically Signed   By: Signa Kell M.D.   On: 02/12/2023 14:29    A/P: Summary: Sheila Mathews is a very pleasant 47 year old woman has had prior lumbar fusion surgery and SI fusion. Unfortunately her quality of life has remained compromised due to persistent back buttock and neuropathic leg pain.  She recently had a spinal cord stimulator trial and noted significant reduction in her pain and improvement in her quality of life. As result of the successful trial she presents today to move forward with permanent implantation.  I have gone over the surgical procedure in great detail including the risks, benefits, and alternatives to surgery. The patient has expressed an understanding of the procedure and a willingness to move forward with surgery. Risks and benefits of surgery were discussed with the patient. These include: Infection, bleeding, death, stroke, paralysis, ongoing or worse pain, need for additional surgery, leak of spinal fluid, Failure of the battery requiring reoperation. Inability to place the paddle requiring the surgery to be aborted. Migration of the lead, failure to obtain results similar to the trial.

## 2023-02-27 NOTE — Anesthesia Procedure Notes (Signed)
Procedure Name: Intubation Date/Time: 02/27/2023 7:36 AM  Performed by: Shary Decamp, CRNAPre-anesthesia Checklist: Patient identified, Patient being monitored, Timeout performed, Emergency Drugs available and Suction available Patient Re-evaluated:Patient Re-evaluated prior to induction Oxygen Delivery Method: Circle System Utilized Preoxygenation: Pre-oxygenation with 100% oxygen Induction Type: IV induction Ventilation: Mask ventilation without difficulty Laryngoscope Size: Miller and 2 Grade View: Grade I Tube type: Oral Tube size: 7.0 mm Number of attempts: 1 Airway Equipment and Method: Stylet Placement Confirmation: ETT inserted through vocal cords under direct vision, positive ETCO2 and breath sounds checked- equal and bilateral Secured at: 21 cm Tube secured with: Tape Dental Injury: Teeth and Oropharynx as per pre-operative assessment

## 2023-02-27 NOTE — Anesthesia Preprocedure Evaluation (Signed)
Anesthesia Evaluation  Patient identified by MRN, date of birth, ID band Patient awake    Reviewed: Allergy & Precautions, NPO status , Patient's Chart, lab work & pertinent test results  History of Anesthesia Complications (+) PONV and history of anesthetic complications  Airway Mallampati: I  TM Distance: >3 FB Neck ROM: Full    Dental  (+) Teeth Intact, Dental Advisory Given   Pulmonary asthma    breath sounds clear to auscultation       Cardiovascular negative cardio ROS  Rhythm:Regular Rate:Normal     Neuro/Psych  Headaches PSYCHIATRIC DISORDERS Anxiety Depression Bipolar Disorder    Neuromuscular disease    GI/Hepatic Neg liver ROS,GERD  ,,  Endo/Other  Hypothyroidism    Renal/GU negative Renal ROS     Musculoskeletal  (+) Arthritis , Osteoarthritis,    Abdominal  (+) + obese  Peds  Hematology   Anesthesia Other Findings   Reproductive/Obstetrics                             Anesthesia Physical Anesthesia Plan  ASA: 2  Anesthesia Plan: General   Post-op Pain Management: Minimal or no pain anticipated   Induction: Intravenous  PONV Risk Score and Plan: 4 or greater and Ondansetron, Dexamethasone, Midazolam, Droperidol and Treatment may vary due to age or medical condition  Airway Management Planned: Oral ETT  Additional Equipment: None  Intra-op Plan:   Post-operative Plan: Extubation in OR  Informed Consent: I have reviewed the patients History and Physical, chart, labs and discussed the procedure including the risks, benefits and alternatives for the proposed anesthesia with the patient or authorized representative who has indicated his/her understanding and acceptance.     Dental advisory given  Plan Discussed with: CRNA  Anesthesia Plan Comments: (PAT note written 12/27/2022 by Shonna Chock, PA-C.  )       Anesthesia Quick Evaluation

## 2023-02-28 MED FILL — Thrombin For Soln 20000 Unit: CUTANEOUS | Qty: 1 | Status: AC

## 2023-03-01 ENCOUNTER — Encounter (HOSPITAL_COMMUNITY): Payer: Self-pay | Admitting: Orthopedic Surgery

## 2023-03-02 NOTE — Anesthesia Postprocedure Evaluation (Signed)
Anesthesia Post Note  Patient: Sheila Mathews  Procedure(s) Performed: LUMBAR SPINAL CORD STIMULATOR INSERTION (Spine Thoracic)     Patient location during evaluation: PACU Anesthesia Type: General Level of consciousness: sedated Pain management: pain level controlled Vital Signs Assessment: post-procedure vital signs reviewed and stable Respiratory status: spontaneous breathing Cardiovascular status: stable Postop Assessment: no backache Anesthetic complications: no  No notable events documented.  Last Vitals:  Vitals:   02/27/23 1030 02/27/23 1045  BP: 107/65 107/66  Pulse: 83 78  Resp: 13 14  Temp:  36.4 C  SpO2: 95% 93%    Last Pain:  Vitals:   02/27/23 1030  TempSrc:   PainSc: Asleep   Pain Goal: Patients Stated Pain Goal: 0 (02/27/23 0608)                 Caren Macadam

## 2023-03-03 NOTE — Progress Notes (Deleted)
Follow Up Note  RE: Sheila Mathews MRN: [id number] DOB: [birth year] Date of Office Visit: 03/04/2023  Referring provider: Odis Luster, PA-C Primary care provider: Odis Luster, PA-C  Chief Complaint: No chief complaint on file.  History of Present Illness: I had the pleasure of seeing Sheila Mathews for a follow up visit at the [facility] on 03/03/2023. She is a 47 y.o. female, who is being followed for urticaria, chronic rhinitis, allergic reaction, atopic dermatitis and possible asthma. Her previous allergy office visit was on 01/21/2023 with Dr. Selena Batten. Today is a regular follow up visit.   I reviewed the bloodwork. Blood count, thyroid, autoimmune screener, tryptase (checks for mast cell issues) and alpha gal (checks for red meat allergy) were all normal which is great.   Chronic urticaria index was slightly elevated - this means that your body is making more histamine than normal which is causing your skin issues. Usually there is no allergic trigger for this and we treat by taking antihistamines. Continue with famotidine and allegra twice a day.   One of your inflammation markers called sed rate was slightly elevated. This is a non-specific marker. We may want to recheck at the next visit.   Almond bloodwork was negative - continue to avoid. If interested in reintroduction then we can discuss in more detail at next visit.   Environmental panel was negative to indoor/outdoor allergens.  Urticaria Pruritic rash started in March 2024. Initially daily and ow once per week. No triggers noted. There was some concerns if Breo or Lamictal was a cause but they were not. Denies changes in diet, meds, personal care products. She took zyrtec and benadryl with minimal benefit. Concerned about allergic triggers. Patient also underwent lung nodule removal which was benign. Based on clinical history and pictures, she likely has chronic idiopathic urticaria. Discussed with  patient, that urticaria is usually caused by release of histamine by cutaneous mast cells but sometimes it is non-histamine mediated. Explained that urticaria is not always associated with allergies. In most cases, the exact etiology for urticaria can not be established and it is considered idiopathic. Possible to have hive outbreak from physical stress on the body as well.  Start allegra (fexofenadine) 180 mg twice a day. If symptoms are not controlled or causes drowsiness let us know. Start Pepcid (famotidine) 20mg  twice a day.  Avoid the following potential triggers: alcohol, tight clothing, NSAIDs, hot showers and getting overheated. See below for proper skin care.  Get bloodwork. Make sure you keep your oncologist appointment.    ? asthma Follows with pulm and taking Breo daily due to shortness of breath. This is better since pulmonary removal. Continue meds as per pulm.   Other allergic rhinitis Symptomatic this spring. Was on AIT at [facility] allergy for 6 months and then stopped due to pruritus.  See below for environmental control measures for pollen. The antihistamines as above should also help.  Will make additional recommendations based on bloodwork results.   Other adverse food reactions, not elsewhere classified, subsequent encounter Almonds caused erythema, swelling, throat discomfort. No prior work up. Continue to avoid almonds. For mild symptoms you can take over the counter antihistamines such as Benadryl 1-2 tablets = 25-50mg  and monitor symptoms closely. If symptoms worsen or if you have severe symptoms including breathing issues, throat closure, significant swelling, whole body hives, severe diarrhea and vomiting, lightheadedness then inject epinephrine and seek immediate medical care afterwards. Emergency action plan given. Get  almond IgE.   Other atopic dermatitis See below for proper skin care.   Assessment and Plan: Sheila Mathews is a 47 y.o. female with: ***  No  follow-ups on file.  No orders of the defined types were placed in this encounter.  Lab Orders  No laboratory test(s) ordered today    Diagnostics: Spirometry:  Tracings reviewed. Her effort: {Blank single:19197::"Good reproducible efforts.","It was hard to get consistent efforts and there is a question as to whether this reflects a maximal maneuver.","Poor effort, data can not be interpreted."} FVC: ***L FEV1: ***L, ***% predicted FEV1/FVC ratio: ***% Interpretation: {Blank single:19197::"Spirometry consistent with mild obstructive disease","Spirometry consistent with moderate obstructive disease","Spirometry consistent with severe obstructive disease","Spirometry consistent with possible restrictive disease","Spirometry consistent with mixed obstructive and restrictive disease","Spirometry uninterpretable due to technique","Spirometry consistent with normal pattern","No overt abnormalities noted given today's efforts"}.  Please see scanned spirometry results for details.  Skin Testing: {Blank single:19197::"Select foods","Environmental allergy panel","Environmental allergy panel and select foods","Food allergy panel","None","Deferred due to recent antihistamines use"}. *** Results discussed with patient/family.   Medication List:  Current Outpatient Medications  Medication Sig Dispense Refill  . albuterol (VENTOLIN HFA) 108 (90 Base) MCG/ACT inhaler Inhale 2 puffs into the lungs every 6 (six) hours as needed for wheezing or shortness of breath. 8 g 6  . BREO ELLIPTA 100-25 MCG/ACT AEPB Inhale 1 puff into the lungs daily.    . clonazePAM (KLONOPIN) 0.5 MG tablet Take 0.5 mg by mouth 3 (three) times daily as needed for anxiety.    Marland Kitchen EMGALITY 120 MG/ML SOAJ Inject 120 mg into the skin every 28 (twenty-eight) days. 1.12 mL 11  . esomeprazole (NEXIUM) 20 MG capsule Take 20 mg by mouth daily.    . famotidine (PEPCID) 20 MG tablet Take 1 tablet (20 mg total) by mouth 2 (two) times daily. 60  tablet 5  . ferrous sulfate 325 (65 FE) MG tablet Take 325 mg by mouth daily with breakfast.    . fexofenadine (ALLEGRA) 60 MG tablet Take 60 mg by mouth daily.    . fluticasone (FLONASE) 50 MCG/ACT nasal spray Place 2 sprays into both nostrils daily.    Marland Kitchen gabapentin (NEURONTIN) 300 MG capsule Take 300 mg by mouth 3 (three) times daily.    Marland Kitchen lamoTRIgine (LAMICTAL) 200 MG tablet Take 200 mg by mouth at bedtime.    Marland Kitchen levonorgestrel (MIRENA, 52 MG,) 20 MCG/DAY IUD 1 each by Intrauterine route once. Every 8 Years, Placed 2021    . levothyroxine (SYNTHROID) 25 MCG tablet 1 tab daily p.o. on an empty stomach x6 days a week, 2 tabs p.o. 1 day a week. (Patient taking differently: Take 25-50 mcg by mouth See admin instructions. 25 mcg 6 days a week each morning, 50 mcg Sunday morning ONLY) 102 tablet 3  . lithium carbonate (LITHOBID) 300 MG ER tablet Take 900 mg by mouth every evening.    . methocarbamol (ROBAXIN) 500 MG tablet Take 1 tablet (500 mg total) by mouth every 8 (eight) hours as needed for up to 5 days for muscle spasms. 15 tablet 0  . ondansetron (ZOFRAN) 4 MG tablet Take 1 tablet (4 mg total) by mouth every 8 (eight) hours as needed for nausea or vomiting. 20 tablet 0  . oxyCODONE-acetaminophen (PERCOCET) 10-325 MG tablet Take 1 tablet by mouth every 6 (six) hours as needed for up to 5 days for pain. 20 tablet 0  . sertraline (ZOLOFT) 100 MG tablet Take 150 mg by mouth at bedtime.    Marland Kitchen  SUMAtriptan (IMITREX) 100 MG tablet Take 1 tablet earliest onset of migraine.  May repeat in 2 hours. Maximum 2 tablets in 24 hours. 10 tablet 11  . traZODone (DESYREL) 100 MG tablet Take 100 mg by mouth at bedtime.     No current facility-administered medications for this visit.   Allergies: Allergies  Allergen Reactions  . Almond (Diagnostic) Anaphylaxis  . Almond Oil Anaphylaxis and Other (See Comments)  . Codeine Other (See Comments)    Makes patient aggressive    . Hydrocodone Other (See Comments)     Makes patient aggressive    I reviewed her past medical history, social history, family history, and environmental history and no significant changes have been reported from her previous visit.  Review of Systems  Constitutional:  Negative for appetite change, chills, fever and unexpected weight change.  HENT:  Negative for congestion and rhinorrhea.   Eyes:  Negative for itching.  Respiratory:  Negative for cough, chest tightness, shortness of breath and wheezing.   Cardiovascular:  Negative for chest pain.  Gastrointestinal:  Negative for abdominal pain.  Genitourinary:  Negative for difficulty urinating.  Skin:  Positive for rash.  Neurological:  Negative for headaches.   Objective: There were no vitals taken for this visit. There is no height or weight on file to calculate BMI. Physical Exam Vitals and nursing note reviewed.  Constitutional:      Appearance: Normal appearance. She is well-developed.  HENT:     Head: Normocephalic and atraumatic.     Right Ear: Tympanic membrane and external ear normal.     Left Ear: Tympanic membrane and external ear normal.     Nose: Nose normal.     Mouth/Throat:     Mouth: Mucous membranes are moist.     Pharynx: Oropharynx is clear.  Eyes:     Conjunctiva/sclera: Conjunctivae normal.  Cardiovascular:     Rate and Rhythm: Normal rate and regular rhythm.     Heart sounds: Normal heart sounds. No murmur heard.    No friction rub. No gallop.  Pulmonary:     Effort: Pulmonary effort is normal.     Breath sounds: Normal breath sounds. No wheezing, rhonchi or rales.  Musculoskeletal:     Cervical back: Neck supple.  Skin:    General: Skin is warm.  Neurological:     Mental Status: She is alert and oriented to person, place, and time.  Psychiatric:        Behavior: Behavior normal.  Previous notes and tests were reviewed. The plan was reviewed with the patient/family, and all questions/concerned were addressed.  It was my pleasure  to see Sheila Mathews today and participate in her care. Please feel free to contact me with any questions or concerns.  Sincerely,  Wyline Mood, DO Allergy & Immunology  [facility] office: [phone] [facility] office: [phone]

## 2023-03-04 ENCOUNTER — Ambulatory Visit: Payer: BC Managed Care – PPO | Admitting: Allergy

## 2023-03-04 DIAGNOSIS — J310 Chronic rhinitis: Secondary | ICD-10-CM

## 2023-03-04 DIAGNOSIS — T781XXD Other adverse food reactions, not elsewhere classified, subsequent encounter: Secondary | ICD-10-CM

## 2023-03-04 DIAGNOSIS — L2089 Other atopic dermatitis: Secondary | ICD-10-CM

## 2023-03-04 DIAGNOSIS — L509 Urticaria, unspecified: Secondary | ICD-10-CM

## 2023-03-12 NOTE — Progress Notes (Unsigned)
Follow Up Note  RE: Sheila Mathews MRN: [id number] DOB: 1976-04-02 Date of Office Visit: 03/13/2023  Referring provider: Odis Luster, PA-C Primary care provider: Odis Luster, PA-C  Chief Complaint: Urticaria (/)  History of Present Illness: I had the pleasure of seeing Sheila Mathews for a follow up visit at the [facility] on 03/13/2023. She is a 47 y.o. female, who is being followed for urticaria, allergic rhinitis, adverse food reaction, atopic dermatitis and ? asthma. Her previous allergy office visit was on 01/21/2023 with Dr. Selena Batten. Today is a regular follow up visit.  Urticaria Currently taking famotidine 20mg  Bid an allegra 180mg  QAM and noticed improvement in symptoms after 1 week and symptom free since then.  Asymptomatic with above regimen.   Had heme/onc follow up in August - no malignancy.    Breathing  Follows with Breo and taking albuterol daily now due to recent URI.    Assessment and Plan: Sheila Mathews is a 47 y.o. female with: Urticaria Past history - Pruritic rash started in March 2024. Initially daily and ow once per week. No triggers noted. There was some concerns if Breo or Lamictal was a cause but they were not. Denies changes in diet, meds, personal care products. She took zyrtec and benadryl with minimal benefit. Concerned about allergic triggers. Patient also underwent lung nodule removal which was benign. Interim history - 2024 bloodwork only [facility] was slightly elevated. Asymptomatic now.  Take Allegra 180mg  at night. Continue Pepcid 20mg  twice a day. If no hives/itching for 2 weeks then: Decrease Pepcid to once a day in the morning. Continue allegra 180mg  once a day. If no symptoms for 2 weeks then: Stop Pepcid. Continue allegra 180mg  once a day. If no symptoms for 2 weeks then: Stop allegra. If you get symptoms then go back to the dose where you didn't have any symptoms.  Avoid the following potential triggers: alcohol, tight clothing,  NSAIDs, hot showers and getting overheated. Continue proper skin care.   Other adverse food reactions, not elsewhere classified, subsequent encounter Past history - Almonds caused erythema, swelling, throat discomfort. No prior work up. Interim history - 2024 bloodwork negative to almonds. Not interested in reintroduction at this time. Continue to avoid almonds. For mild symptoms you can take over the counter antihistamines such as Benadryl 1-2 tablets = 25-50mg  and monitor symptoms closely. If symptoms worsen or if you have severe symptoms including breathing issues, throat closure, significant swelling, whole body hives, severe diarrhea and vomiting, lightheadedness then inject epinephrine and seek immediate medical care afterwards. Emergency action plan in place. If interested we can schedule food challenge to almonds.  Other allergic rhinitis Past history - Symptomatic this spring. Was on AIT at [facility] allergy for 6 months and then stopped due to pruritus.  Interim history - 2024 bloodwork negative to environmental allergies.  You may have some sensitivity to pollen. If worsens, recommend allergy skin testing next.  Use Flonase (fluticasone) nasal spray 1-2 sprays per nostril once a day as needed for nasal congestion.  Nasal saline spray (i.e., Simply Saline) or nasal saline lavage (i.e., NeilMed) is recommended as needed and prior to medicated nasal sprays.  Follow up with pulm regarding respiratory issues.   Return in about 6 months (around 09/10/2023).  No orders of the defined types were placed in this encounter.  Lab Orders  No laboratory test(s) ordered today    Diagnostics: None.   Medication List:  Current Outpatient Medications  Medication  Sig Dispense Refill   albuterol (VENTOLIN HFA) 108 (90 Base) MCG/ACT inhaler Inhale 2 puffs into the lungs every 6 (six) hours as needed for wheezing or shortness of breath. 8 g 6   BREO ELLIPTA 100-25 MCG/ACT AEPB Inhale 1 puff  into the lungs daily.     clonazePAM (KLONOPIN) 0.5 MG tablet Take 0.5 mg by mouth 3 (three) times daily as needed for anxiety.     EMGALITY 120 MG/ML SOAJ Inject 120 mg into the skin every 28 (twenty-eight) days. 1.12 mL 11   esomeprazole (NEXIUM) 20 MG capsule Take 20 mg by mouth daily.     famotidine (PEPCID) 20 MG tablet Take 1 tablet (20 mg total) by mouth 2 (two) times daily. 60 tablet 5   ferrous sulfate 325 (65 FE) MG tablet Take 325 mg by mouth daily with breakfast.     fexofenadine (ALLEGRA) 60 MG tablet Take 60 mg by mouth daily.     fluticasone (FLONASE) 50 MCG/ACT nasal spray Place 2 sprays into both nostrils daily.     gabapentin (NEURONTIN) 300 MG capsule Take 300 mg by mouth 3 (three) times daily.     lamoTRIgine (LAMICTAL) 200 MG tablet Take 200 mg by mouth at bedtime.     levonorgestrel (MIRENA, 52 MG,) 20 MCG/DAY IUD 1 each by Intrauterine route once. Every 8 Years, Placed 2021     levothyroxine (SYNTHROID) 25 MCG tablet 1 tab daily p.o. on an empty stomach x6 days a week, 2 tabs p.o. 1 day a week. (Patient taking differently: Take 25-50 mcg by mouth See admin instructions. 25 mcg 6 days a week each morning, 50 mcg Sunday morning ONLY) 102 tablet 3   lithium carbonate (LITHOBID) 300 MG ER tablet Take 900 mg by mouth every evening.     ondansetron (ZOFRAN) 4 MG tablet Take 1 tablet (4 mg total) by mouth every 8 (eight) hours as needed for nausea or vomiting. 20 tablet 0   sertraline (ZOLOFT) 100 MG tablet Take 150 mg by mouth at bedtime.     SUMAtriptan (IMITREX) 100 MG tablet Take 1 tablet earliest onset of migraine.  May repeat in 2 hours. Maximum 2 tablets in 24 hours. 10 tablet 11   traZODone (DESYREL) 100 MG tablet Take 100 mg by mouth at bedtime.     No current facility-administered medications for this visit.   Allergies: Allergies  Allergen Reactions   Almond (Diagnostic) Anaphylaxis   Almond Oil Anaphylaxis and Other (See Comments)   Codeine Other (See Comments)     Makes patient aggressive     Hydrocodone Other (See Comments)    Makes patient aggressive    I reviewed her past medical history, social history, family history, and environmental history and no significant changes have been reported from her previous visit.  Review of Systems  Constitutional:  Negative for appetite change, chills, fever and unexpected weight change.  HENT:  Negative for congestion and rhinorrhea.   Eyes:  Negative for itching.  Respiratory:  Negative for cough, chest tightness, shortness of breath and wheezing.   Cardiovascular:  Negative for chest pain.  Gastrointestinal:  Negative for abdominal pain.  Genitourinary:  Negative for difficulty urinating.  Skin:  Negative for rash.  Neurological:  Negative for headaches.    Objective: BP 120/80   Pulse 86   Temp (!) 100.6 F (38.1 C) (Temporal)   Resp 18   SpO2 96%  There is no height or weight on file to calculate BMI. Physical Exam  Vitals and nursing note reviewed.  Constitutional:      Appearance: Normal appearance. She is well-developed.  HENT:     Head: Normocephalic and atraumatic.     Right Ear: Tympanic membrane and external ear normal.     Left Ear: Tympanic membrane and external ear normal.     Nose: Nose normal.     Mouth/Throat:     Mouth: Mucous membranes are moist.     Pharynx: Oropharynx is clear.  Eyes:     Conjunctiva/sclera: Conjunctivae normal.  Cardiovascular:     Rate and Rhythm: Normal rate and regular rhythm.     Heart sounds: Normal heart sounds. No murmur heard.    No friction rub. No gallop.  Pulmonary:     Effort: Pulmonary effort is normal.     Breath sounds: Normal breath sounds. No wheezing, rhonchi or rales.  Musculoskeletal:     Cervical back: Neck supple.  Skin:    General: Skin is warm.  Neurological:     Mental Status: She is alert and oriented to person, place, and time.  Psychiatric:        Behavior: Behavior normal.    Previous notes and tests were  reviewed. The plan was reviewed with the patient/family, and all questions/concerned were addressed.  It was my pleasure to see Sheila Mathews today and participate in her care. Please feel free to contact me with any questions or concerns.  Sincerely,  Wyline Mood, DO Allergy & Immunology  [facility] office: [phone] [facility] office: [phone]

## 2023-03-13 ENCOUNTER — Ambulatory Visit (INDEPENDENT_AMBULATORY_CARE_PROVIDER_SITE_OTHER): Payer: BC Managed Care – PPO | Admitting: Allergy

## 2023-03-13 ENCOUNTER — Ambulatory Visit: Payer: BC Managed Care – PPO | Admitting: Allergy

## 2023-03-13 ENCOUNTER — Encounter: Payer: Self-pay | Admitting: Allergy

## 2023-03-13 VITALS — BP 120/80 | HR 86 | Temp 100.6°F | Resp 18

## 2023-03-13 DIAGNOSIS — L509 Urticaria, unspecified: Secondary | ICD-10-CM

## 2023-03-13 DIAGNOSIS — J3089 Other allergic rhinitis: Secondary | ICD-10-CM

## 2023-03-13 DIAGNOSIS — T781XXD Other adverse food reactions, not elsewhere classified, subsequent encounter: Secondary | ICD-10-CM | POA: Diagnosis not present

## 2023-03-13 DIAGNOSIS — L2089 Other atopic dermatitis: Secondary | ICD-10-CM

## 2023-03-13 NOTE — Patient Instructions (Addendum)
Hives: Take Allegra 180mg  at night. Continue Pepcid 20mg  twice a day. If no hives/itching for 2 weeks then: Decrease Pepcid to once a day in the morning. Continue allegra 180mg  once a day. If no symptoms for 2 weeks then: Stop Pepcid. Continue allegra 180mg  once a day. If no symptoms for 2 weeks then: Stop allegra. If you get symptoms then go back to the dose where you didn't have any symptoms.   Avoid the following potential triggers: alcohol, tight clothing, NSAIDs, hot showers and getting overheated. Continue proper skin care.   Rhinitis  Bloodwork was negative to environmental allergies.  You may have some sensitivity to pollen. If worsens, recommend allergy skin testing next.  Use Flonase (fluticasone) nasal spray 1-2 sprays per nostril once a day as needed for nasal congestion.  Nasal saline spray (i.e., Simply Saline) or nasal saline lavage (i.e., NeilMed) is recommended as needed and prior to medicated nasal sprays.  Food Continue to avoid almonds. For mild symptoms you can take over the counter antihistamines such as Benadryl 1-2 tablets = 25-50mg  and monitor symptoms closely. If symptoms worsen or if you have severe symptoms including breathing issues, throat closure, significant swelling, whole body hives, severe diarrhea and vomiting, lightheadedness then inject epinephrine and seek immediate medical care afterwards. Emergency action plan in place. If interested we can schedule food challenge to almonds. You must be off antihistamines for 3-5 days before. Must be in good health and not ill. No vaccines/injections/antibiotics within the past 7 days. Plan on being in the office for 2-3 hours and must bring in the food you want to do the oral challenge for. You must call to schedule an appointment and specify it's for a food challenge.   Keep follow up with all your specialists.   Follow up in 6 months or sooner if needed.  Skin care recommendations  Bath time: Always use  lukewarm water. AVOID very hot or cold water. Keep bathing time to 5-10 minutes. Do NOT use bubble bath. Use a mild soap and use just enough to wash the dirty areas. Do NOT scrub skin vigorously.  After bathing, pat dry your skin with a towel. Do NOT rub or scrub the skin.  Moisturizers and prescriptions:  ALWAYS apply moisturizers immediately after bathing (within 3 minutes). This helps to lock-in moisture. Use the moisturizer several times a day over the whole body. Good summer moisturizers include: Aveeno, CeraVe, Cetaphil. Good winter moisturizers include: Aquaphor, Vaseline, Cerave, Cetaphil, Eucerin, Vanicream. When using moisturizers along with medications, the moisturizer should be applied about one hour after applying the medication to prevent diluting effect of the medication or moisturize around where you applied the medications. When not using medications, the moisturizer can be continued twice daily as maintenance.  Laundry and clothing: Avoid laundry products with added color or perfumes. Use unscented hypo-allergenic laundry products such as Tide free, Cheer free & gentle, and All free and clear.  If the skin still seems dry or sensitive, you can try double-rinsing the clothes. Avoid tight or scratchy clothing such as wool. Do not use fabric softeners or dyer sheets.   Reducing Pollen Exposure Pollen seasons: trees (spring), grass (summer) and ragweed/weeds (fall). Keep windows closed in your home and car to lower pollen exposure.  Install air conditioning in the bedroom and throughout the house if possible.  Avoid going out in dry windy days - especially early morning. Pollen counts are highest between 5 - 10 AM and on dry, hot and windy days.  Save outside activities for late afternoon or after a heavy rain, when pollen levels are lower.  Avoid mowing of grass if you have grass pollen allergy. Be aware that pollen can also be transported indoors on people and pets.  Dry  your clothes in an automatic dryer rather than hanging them outside where they might collect pollen.  Rinse hair and eyes before bedtime.

## 2023-03-14 DIAGNOSIS — Z4889 Encounter for other specified surgical aftercare: Secondary | ICD-10-CM | POA: Diagnosis not present

## 2023-03-14 DIAGNOSIS — M7061 Trochanteric bursitis, right hip: Secondary | ICD-10-CM | POA: Diagnosis not present

## 2023-03-18 ENCOUNTER — Encounter (HOSPITAL_COMMUNITY): Payer: Self-pay | Admitting: Orthopedic Surgery

## 2023-03-18 DIAGNOSIS — F3132 Bipolar disorder, current episode depressed, moderate: Secondary | ICD-10-CM | POA: Diagnosis not present

## 2023-03-24 ENCOUNTER — Encounter: Payer: Self-pay | Admitting: Neurology

## 2023-03-25 ENCOUNTER — Other Ambulatory Visit: Payer: Self-pay | Admitting: Pulmonary Disease

## 2023-03-27 DIAGNOSIS — F411 Generalized anxiety disorder: Secondary | ICD-10-CM | POA: Diagnosis not present

## 2023-03-27 DIAGNOSIS — F3111 Bipolar disorder, current episode manic without psychotic features, mild: Secondary | ICD-10-CM | POA: Diagnosis not present

## 2023-03-28 DIAGNOSIS — G43909 Migraine, unspecified, not intractable, without status migrainosus: Secondary | ICD-10-CM | POA: Diagnosis not present

## 2023-03-31 ENCOUNTER — Ambulatory Visit: Payer: BC Managed Care – PPO

## 2023-03-31 ENCOUNTER — Other Ambulatory Visit (INDEPENDENT_AMBULATORY_CARE_PROVIDER_SITE_OTHER): Payer: BC Managed Care – PPO

## 2023-03-31 ENCOUNTER — Telehealth: Payer: Self-pay | Admitting: Neurology

## 2023-03-31 DIAGNOSIS — G43109 Migraine with aura, not intractable, without status migrainosus: Secondary | ICD-10-CM

## 2023-03-31 MED ORDER — DIPHENHYDRAMINE HCL 50 MG/ML IJ SOLN
50.0000 mg | Freq: Once | INTRAMUSCULAR | Status: AC
Start: 2023-03-31 — End: 2023-03-31
  Administered 2023-03-31: 50 mg via INTRAMUSCULAR

## 2023-03-31 MED ORDER — METOCLOPRAMIDE HCL 5 MG/ML IJ SOLN
10.0000 mg | Freq: Once | INTRAMUSCULAR | Status: AC
Start: 2023-03-31 — End: 2023-03-31
  Administered 2023-03-31: 10 mg via INTRAMUSCULAR

## 2023-03-31 MED ORDER — KETOROLAC TROMETHAMINE 60 MG/2ML IM SOLN
60.0000 mg | Freq: Once | INTRAMUSCULAR | Status: AC
Start: 2023-03-31 — End: 2023-03-31
  Administered 2023-03-31: 60 mg via INTRAMUSCULAR

## 2023-03-31 NOTE — Telephone Encounter (Signed)
Patient has had a migraine for 9 days and is wanting to know if she can come in for cocktail

## 2023-04-01 NOTE — Telephone Encounter (Signed)
Patient came in for headache cocktail

## 2023-04-02 DIAGNOSIS — F319 Bipolar disorder, unspecified: Secondary | ICD-10-CM | POA: Diagnosis not present

## 2023-04-02 DIAGNOSIS — E611 Iron deficiency: Secondary | ICD-10-CM | POA: Diagnosis not present

## 2023-04-02 DIAGNOSIS — F419 Anxiety disorder, unspecified: Secondary | ICD-10-CM | POA: Diagnosis not present

## 2023-04-02 DIAGNOSIS — Z23 Encounter for immunization: Secondary | ICD-10-CM | POA: Diagnosis not present

## 2023-04-02 DIAGNOSIS — E039 Hypothyroidism, unspecified: Secondary | ICD-10-CM | POA: Diagnosis not present

## 2023-04-02 DIAGNOSIS — E559 Vitamin D deficiency, unspecified: Secondary | ICD-10-CM | POA: Diagnosis not present

## 2023-04-02 DIAGNOSIS — Z5181 Encounter for therapeutic drug level monitoring: Secondary | ICD-10-CM | POA: Diagnosis not present

## 2023-04-15 DIAGNOSIS — F3132 Bipolar disorder, current episode depressed, moderate: Secondary | ICD-10-CM | POA: Diagnosis not present

## 2023-04-23 DIAGNOSIS — F3111 Bipolar disorder, current episode manic without psychotic features, mild: Secondary | ICD-10-CM | POA: Diagnosis not present

## 2023-04-23 DIAGNOSIS — F411 Generalized anxiety disorder: Secondary | ICD-10-CM | POA: Diagnosis not present

## 2023-04-24 DIAGNOSIS — F411 Generalized anxiety disorder: Secondary | ICD-10-CM | POA: Diagnosis not present

## 2023-04-24 DIAGNOSIS — F3111 Bipolar disorder, current episode manic without psychotic features, mild: Secondary | ICD-10-CM | POA: Diagnosis not present

## 2023-04-30 DIAGNOSIS — F3132 Bipolar disorder, current episode depressed, moderate: Secondary | ICD-10-CM | POA: Diagnosis not present

## 2023-05-08 DIAGNOSIS — F411 Generalized anxiety disorder: Secondary | ICD-10-CM | POA: Diagnosis not present

## 2023-05-08 DIAGNOSIS — F3111 Bipolar disorder, current episode manic without psychotic features, mild: Secondary | ICD-10-CM | POA: Diagnosis not present

## 2023-05-14 DIAGNOSIS — F3132 Bipolar disorder, current episode depressed, moderate: Secondary | ICD-10-CM | POA: Diagnosis not present

## 2023-05-21 DIAGNOSIS — R102 Pelvic and perineal pain: Secondary | ICD-10-CM | POA: Diagnosis not present

## 2023-06-03 DIAGNOSIS — F3132 Bipolar disorder, current episode depressed, moderate: Secondary | ICD-10-CM | POA: Diagnosis not present

## 2023-06-04 DIAGNOSIS — Z4889 Encounter for other specified surgical aftercare: Secondary | ICD-10-CM | POA: Diagnosis not present

## 2023-06-04 DIAGNOSIS — M546 Pain in thoracic spine: Secondary | ICD-10-CM | POA: Diagnosis not present

## 2023-06-04 DIAGNOSIS — M7061 Trochanteric bursitis, right hip: Secondary | ICD-10-CM | POA: Diagnosis not present

## 2023-06-05 DIAGNOSIS — R1032 Left lower quadrant pain: Secondary | ICD-10-CM | POA: Diagnosis not present

## 2023-06-05 DIAGNOSIS — Z30431 Encounter for routine checking of intrauterine contraceptive device: Secondary | ICD-10-CM | POA: Diagnosis not present

## 2023-06-10 ENCOUNTER — Encounter: Payer: Self-pay | Admitting: Neurology

## 2023-06-11 ENCOUNTER — Other Ambulatory Visit: Payer: Self-pay | Admitting: Neurology

## 2023-06-11 MED ORDER — EMGALITY 120 MG/ML ~~LOC~~ SOAJ: 120.0000 mg | SUBCUTANEOUS | 11 refills | Status: DC

## 2023-06-19 DIAGNOSIS — F3111 Bipolar disorder, current episode manic without psychotic features, mild: Secondary | ICD-10-CM | POA: Diagnosis not present

## 2023-06-19 DIAGNOSIS — F411 Generalized anxiety disorder: Secondary | ICD-10-CM | POA: Diagnosis not present

## 2023-07-01 DIAGNOSIS — M546 Pain in thoracic spine: Secondary | ICD-10-CM | POA: Diagnosis not present

## 2023-07-01 DIAGNOSIS — M7061 Trochanteric bursitis, right hip: Secondary | ICD-10-CM | POA: Diagnosis not present

## 2023-07-22 NOTE — Progress Notes (Unsigned)
Virtual Visit via Video Note  Consent was obtained for video visit:  Yes.   Answered questions that patient had about telehealth interaction:  Yes.   I discussed the limitations, risks, security and privacy concerns of performing an evaluation and management service by telemedicine. I also discussed with the patient that there may be a patient responsible charge related to this service. The patient expressed understanding and agreed to proceed.  Pt location: Home Physician Location: office Name of referring provider:  Odis Luster, PA-C I connected with [name] at patients initiation/request on 07/23/2023 at  9:50 AM EST by video enabled telemedicine application and verified that I am speaking with the correct person using two identifiers. 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  She will try taking naproxen 500mg  with sumatriptan tablet if she wakes up with migraine.  If ineffective, will instead try sumatriptan [place] vs NS Limit use of pain relievers to no more than 2 days out of week to prevent risk of rebound or medication-overuse headache. Keep headache diary Follow up 6 months.  Subjective:  Sheila Mathews is a 48 year old right-handed woman history of migraines, panic attacks, anxiety and allergic rhinitis who follows up for migraines.   UPDATE:  On Lithium.  Has tremors.  Increased with adjustment of nerve stimulator.  Thyroid function testing regularly monitored and has been well-controlled.  No family history of tremor.  Intensity:  6/10, 8/10 if wakes up with migraine Duration:  1 to 2 hours with sumatriptan. If she wakes up with migraine, needs to repeat dose.    Frequency:  5-6 a month (wakes up with  migraine twice a month)  Reports increased light sensitivity with her migraines over past 6 months.    She needed a migraine cocktail aggravated by programming of her new nerve stimulator.  She has readjusted settings and now doing well.  Current NSAIDS:  none Current analgesics:  none Current triptans:  Sumatriptan 100mg  Current ergotamine:  none Current anti-emetic:  none Current muscle relaxants:  Robaxin Current anti-anxiolytic:  Klonopin Current sleep aide:  none Current Antihypertensive medications:  none Current Antidepressant/antipsychotic/mood medications:  Sertraline 150mg , Lithium Current Anticonvulsant medications:  lamotrigine 200mg  daily, gabapentin 300mg  TID PRN Current anti-CGRP:  Emgality Current Vitamins/Herbal/Supplements:  ferrous sulfate Current Antihistamines/Decongestants:  Flonase Other therapy:  none Hormone/birth control: Mirena Other medications:  Synthroid, Klonopin, trazodone   Caffeine:  1 cup of coffee per day Alcohol:  Occasionally Smoker:  No Diet:  8 healthy. Drinks a lot of water. Exercise:  Good Depression/stress:  Anxiety controlled Other pain:  Back pain - plan for surgery. Sleep hygiene:  Wakes up during the night and difficulty falling back asleep   HISTORY:  Onset:  48 years old Location:  Usually right-sided, retro-orbital, and moves to the top of the head Quality:  Usually pressure-like, sometimes stabbing Initial Intensity:  Usually 6-7/10, 10 out of 10 for severe Aura:  Sometimes preceded by fuzzy and tunnel vision - when closes eyes she sees flashes of light.  Prodrome:  No Associated symptoms:  First severe, experiences nausea, blurred vision, photophobia, phonophobia, and osmophobia. Initial Duration:  Severe attacks last up to 4 days. Otherwise 2-3 hours Initial Frequency:  Severe attacks occur once a  month (4 days per month), otherwise other headaches occur 2 days per week. Total of 15-18 headache days per  month. Triggers/exacerbating factors:  Menstrual cycle Relieving factors:  Phenergan/Toradol shots Activity:  Cannot function with severe attacks   For years, she had headaches occurring 2 days a week, lasting 2-3 hours, but had severe migraines once a month lasting 4 days.  In 2019, they started occurring only twice a month but still lasting 4 days..   Past NSAIDS:  Ketorolac, naproxen Past analgesics:  Excedrin, Extra-strength Tylenol Past abortive triptans:  Relpax 40mg , Maxalt 10mg , sumatriptan injection Past abortive ergotamine:  none Past muscle relaxants:  Flexeril Past anti-emetic:  Zofran 4mg  Past antihypertensive medications:  No beta blockers as runs low HR Past antidepressant medications:  amitriptline 10mg  (side effects), citalopram Past anticonvulsant medications:  Depakote, topiramate, gabapentin (caused brain fog) Past anti-CGRP:  Aimovig (effective), Ubrelvy 100mg  (ineffective), Nurtec (rescue- ineffective), Zavzpret NS (side effects) Other past therapies:  non     Family history of headache:  No  Past Medical History: Past Medical History:  Diagnosis Date   Abnormal cervical Papanicolaou smear 04/20/2020   Allergies    Anemia    with pregnancy , resolved after delivery   Anxiety    Asthma 11/05/2022   mild   Bipolar disorder (HCC)    Chicken pox    Chronic sinusitis 07/26/2016   no current problems as of 12/05/22   Complication of anesthesia    pt doesn't like mask she has panic attack; childhood, no problems as an adult   Depression    Dyspnea    with intermittent chest tightness at times, has used daughter's albuterol inhaler which has helped per patient 12/05/22   Family history of adverse reaction to anesthesia    Mother PONV   GERD (gastroesophageal reflux disease)    Hypothyroidism    Lung nodule 01/2023   pulmonary adenoma   Migraines    Nonsmoker    Pelvic pain in female 05/13/2014   resolved, no current problem as of 12/05/22   Pneumonia    x 1    PONV (postoperative nausea and vomiting)    no issues with short procdures, had one isntance of vomitus following a longer procedure    Sciatica     Medications: Outpatient Encounter Medications as of 07/23/2023  Medication Sig   albuterol (VENTOLIN HFA) 108 (90 Base) MCG/ACT inhaler TAKE 2 PUFFS BY MOUTH EVERY 6 HOURS AS NEEDED FOR WHEEZE OR SHORTNESS OF BREATH   BREO ELLIPTA 100-25 MCG/ACT AEPB INHALE 1 PUFF BY MOUTH/INTO LUNGS EVERY DAY   clonazePAM (KLONOPIN) 0.5 MG tablet Take 0.5 mg by mouth 3 (three) times daily as needed for anxiety.   EMGALITY 120 MG/ML SOAJ Inject 120 mg into the skin every 28 (twenty-eight) days.   esomeprazole (NEXIUM) 20 MG capsule Take 20 mg by mouth daily.   famotidine (PEPCID) 20 MG tablet Take 1 tablet (20 mg total) by mouth 2 (two) times daily.   ferrous sulfate 325 (65 FE) MG tablet Take 325 mg by mouth daily with breakfast.   fexofenadine (ALLEGRA) 60 MG tablet Take 60 mg by mouth daily.   fluticasone (FLONASE) 50 MCG/ACT nasal spray Place 2 sprays into both nostrils daily.   gabapentin (NEURONTIN) 300 MG capsule Take 300 mg by mouth 3 (three) times daily.   lamoTRIgine (LAMICTAL) 200 MG tablet Take 200 mg by mouth at bedtime.   levonorgestrel (MIRENA, 52 MG,) 20 MCG/DAY IUD 1 each by Intrauterine route  once. Every 8 Years, Placed 2021   levothyroxine (SYNTHROID) 25 MCG tablet 1 tab daily p.o. on an empty stomach x6 days a week, 2 tabs p.o. 1 day a week. (Patient taking differently: Take 25-50 mcg by mouth See admin instructions. 25 mcg 6 days a week each morning, 50 mcg Sunday morning ONLY)   lithium carbonate (LITHOBID) 300 MG ER tablet Take 900 mg by mouth every evening.   ondansetron (ZOFRAN) 4 MG tablet Take 1 tablet (4 mg total) by mouth every 8 (eight) hours as needed for nausea or vomiting.   sertraline (ZOLOFT) 100 MG tablet Take 150 mg by mouth at bedtime.   SUMAtriptan (IMITREX) 100 MG tablet Take 1 tablet earliest onset of migraine.  May  repeat in 2 hours. Maximum 2 tablets in 24 hours.   traZODone (DESYREL) 100 MG tablet Take 100 mg by mouth at bedtime.   No facility-administered encounter medications on file as of 07/23/2023.    Allergies: Allergies  Allergen Reactions   Almond (Diagnostic) Anaphylaxis   Almond Oil Anaphylaxis and Other (See Comments)   Codeine Other (See Comments)    Makes patient aggressive     Hydrocodone Other (See Comments)    Makes patient aggressive     Family History: Family History  Problem Relation Age of Onset   Depression Mother    Mental illness Mother    Diabetes Brother    Drug abuse Brother    Early death Brother    Mental illness Brother    Breast cancer Maternal Grandmother 30   Cancer Maternal Grandfather        head neck    Diabetes Maternal Grandfather    Lung cancer Maternal Grandfather    Cancer Paternal Grandmother        gallbladder   Colon cancer Paternal Grandfather 78   Diabetes Paternal Grandfather    Breast cancer Maternal Aunt 65    Observations/Objective:   No acute distress.  Alert and oriented.  Speech fluent and not dysarthric.  Language intact.  Eyes orthophoric on primary gaze.  Face symmetric.   Follow Up Instructions:    -I discussed the assessment and treatment plan with the patient. The patient was provided an opportunity to ask questions and all were answered. The patient agreed with the plan and demonstrated an understanding of the instructions.   The patient was advised to call back or seek an in-person evaluation if the symptoms worsen or if the condition fails to improve as anticipated.   Cira Servant, DO  CC:  Chilton Greathouse, PA-C

## 2023-07-23 ENCOUNTER — Telehealth (INDEPENDENT_AMBULATORY_CARE_PROVIDER_SITE_OTHER): Payer: BC Managed Care – PPO | Admitting: Neurology

## 2023-07-23 ENCOUNTER — Encounter: Payer: Self-pay | Admitting: Neurology

## 2023-07-23 DIAGNOSIS — G251 Drug-induced tremor: Secondary | ICD-10-CM | POA: Diagnosis not present

## 2023-07-23 DIAGNOSIS — G43109 Migraine with aura, not intractable, without status migrainosus: Secondary | ICD-10-CM | POA: Diagnosis not present

## 2023-07-23 MED ORDER — SUMATRIPTAN SUCCINATE 100 MG PO TABS: ORAL_TABLET | ORAL | 11 refills | Status: AC

## 2023-07-23 MED ORDER — NAPROXEN 500 MG PO TABS: ORAL_TABLET | ORAL | 5 refills | Status: DC

## 2023-07-23 MED ORDER — EMGALITY 120 MG/ML ~~LOC~~ SOAJ: 120.0000 mg | SUBCUTANEOUS | 11 refills | Status: DC

## 2023-07-23 NOTE — Patient Instructions (Addendum)
Continue Emgality every 4 weeks Sumatriptan for migraines as needed/directed If you wake up with migraine, take naproxen 500mg  with sumatriptan.  If it does not abort the migraine quickly enough, contact me and we can instead try sumatriptan shot or nasal spray to take instead when you wake up with migraine. Limit use of pain relievers to no more than 2 days out of week to prevent risk of rebound or medication-overuse headache. Keep headache diary Follow up 6 months.

## 2023-09-11 ENCOUNTER — Ambulatory Visit: Payer: BC Managed Care – PPO | Admitting: Allergy

## 2024-01-05 NOTE — Progress Notes (Deleted)
 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  She will try taking naproxen  500mg  with sumatriptan  tablet if she wakes up with migraine.  If ineffective, will instead try sumatriptan  [place] vs NS Limit use of pain relievers to no more than 2 days out of week to prevent risk of rebound or medication-overuse headache. Keep headache diary Follow up ***  Subjective:  Sheila Mathews is a 48 year old right-handed woman history of migraines, panic attacks, anxiety and allergic rhinitis who follows up for migraines.   UPDATE: *** Intensity:  6/10, 8/10 if wakes up with migraine Duration:  1 to 2 hours with sumatriptan . If she wakes up with migraine, needs to repeat dose.    Frequency:  5-6 a month (wakes up with migraine twice a month)  Reports increased light sensitivity with her migraines over past 6 months.    She needed a migraine cocktail aggravated by programming of her new nerve stimulator.  She has readjusted settings and now doing well.  Current NSAIDS:  none Current analgesics:  none Current triptans:  Sumatriptan  100mg  Current ergotamine:  none Current anti-emetic:  none Current muscle relaxants:  Robaxin  Current anti-anxiolytic:  Klonopin  Current sleep aide:  none Current Antihypertensive medications:  none Current Antidepressant/antipsychotic/mood medications:  Sertraline  150mg , Lithium  Current Anticonvulsant medications:  lamotrigine  200mg  daily, gabapentin  300mg  TID PRN Current anti-CGRP:  Emgality  Current Vitamins/Herbal/Supplements:  ferrous sulfate  Current Antihistamines/Decongestants:  Flonase  Other therapy:  none Hormone/birth control: Mirena  Other medications:  Synthroid , Klonopin , trazodone    Caffeine:  1 cup of coffee per  day Alcohol:  Occasionally Smoker:  No Diet:  8 healthy. Drinks a lot of water. Exercise:  Good Depression/stress:  Anxiety controlled Other pain:  Back pain - plan for surgery. Sleep hygiene:  Wakes up during the night and difficulty falling back asleep   HISTORY:  Headaches: Onset:  48 years old Location:  Usually right-sided, retro-orbital, and moves to the top of the head Quality:  Usually pressure-like, sometimes stabbing Initial Intensity:  Usually 6-7/10, 10 out of 10 for severe Aura:  Sometimes preceded by fuzzy and tunnel vision - when closes eyes she sees flashes of light.  Prodrome:  No Associated symptoms:  First severe, experiences nausea, blurred vision, photophobia, phonophobia, and osmophobia. Initial Duration:  Severe attacks last up to 4 days. Otherwise 2-3 hours Initial Frequency:  Severe attacks occur once a month (4 days per month), otherwise other headaches occur 2 days per week. Total of 15-18 headache days per month. Triggers/exacerbating factors:  Menstrual cycle Relieving factors:  Phenergan /Toradol  shots Activity:  Cannot function with severe attacks   For years, she had headaches occurring 2 days a week, lasting 2-3 hours, but had severe migraines once a month lasting 4 days.  In 2019, they started occurring only twice a month but still lasting 4 days.  Drug-induced tremor: On Lithium .  Has tremors.  Increased with adjustment of nerve stimulator.  Thyroid  function testing regularly monitored and has been well-controlled.  No family history of tremor.   Past NSAIDS:  Ketorolac , naproxen  Past analgesics:  Excedrin, Extra-strength Tylenol  Past abortive triptans:  Relpax  40mg , Maxalt 10mg , sumatriptan  injection Past abortive ergotamine:  none Past muscle relaxants:  Flexeril Past anti-emetic:  Zofran  4mg  Past antihypertensive medications:  No beta blockers as runs low  HR Past antidepressant medications:  amitriptline 10mg  (side effects), citalopram Past  anticonvulsant medications:  Depakote, topiramate , gabapentin  (caused brain fog) Past anti-CGRP:  Aimovig  (effective), Ubrelvy  100mg  (ineffective), Nurtec (rescue- ineffective), Zavzpret NS (side effects) Other past therapies:  non     Family history of headache:  No  PAST MEDICAL HISTORY: Past Medical History:  Diagnosis Date   Abnormal cervical Papanicolaou smear 04/20/2020   Allergies    Anemia    with pregnancy , resolved after delivery   Anxiety    Asthma 11/05/2022   mild   Bipolar disorder (HCC)    Chicken pox    Chronic sinusitis 07/26/2016   no current problems as of 12/05/22   Complication of anesthesia    pt doesn't like mask she has panic attack; childhood, no problems as an adult   Depression    Dyspnea    with intermittent chest tightness at times, has used daughter's albuterol  inhaler which has helped per patient 12/05/22   Family history of adverse reaction to anesthesia    Mother PONV   GERD (gastroesophageal reflux disease)    Hypothyroidism    Lung nodule 01/2023   pulmonary adenoma   Migraines    Nonsmoker    Pelvic pain in female 05/13/2014   resolved, no current problem as of 12/05/22   Pneumonia    x 1   PONV (postoperative nausea and vomiting)    no issues with short procdures, had one isntance of vomitus following a longer procedure    Sciatica     MEDICATIONS: Current Outpatient Medications on File Prior to Visit  Medication Sig Dispense Refill   albuterol  (VENTOLIN  HFA) 108 (90 Base) MCG/ACT inhaler TAKE 2 PUFFS BY MOUTH EVERY 6 HOURS AS NEEDED FOR WHEEZE OR SHORTNESS OF BREATH 8.5 each 6   clonazePAM  (KLONOPIN ) 0.5 MG tablet Take 0.5 mg by mouth 3 (three) times daily as needed for anxiety.     EMGALITY  120 MG/ML SOAJ Inject 120 mg into the skin every 28 (twenty-eight) days. 1.12 mL 11   esomeprazole (NEXIUM) 20 MG capsule Take 20 mg by mouth daily.     ferrous sulfate  325 (65 FE) MG tablet Take 325 mg by mouth daily with breakfast.      fluticasone  (FLONASE ) 50 MCG/ACT nasal spray Place 2 sprays into both nostrils daily.     gabapentin  (NEURONTIN ) 300 MG capsule Take 300 mg by mouth 3 (three) times daily.     lamoTRIgine  (LAMICTAL ) 200 MG tablet Take 200 mg by mouth at bedtime.     levonorgestrel  (MIRENA , 52 MG,) 20 MCG/DAY IUD 1 each by Intrauterine route once. Every 8 Years, Placed 2021     levothyroxine  (SYNTHROID ) 25 MCG tablet 1 tab daily p.o. on an empty stomach x6 days a week, 2 tabs p.o. 1 day a week. (Patient taking differently: Take 25-50 mcg by mouth See admin instructions. 25 mcg 6 days a week each morning, 50 mcg Sunday morning ONLY) 102 tablet 3   lithium  carbonate (LITHOBID ) 300 MG ER tablet Take 900 mg by mouth every evening.     naproxen  (NAPROSYN ) 500 MG tablet Take 1 tablet with 1 tablet of sumatriptan  if you wake up with migraine.  May repeat once after 2 hours if needed. 20 tablet 5   sertraline  (ZOLOFT ) 100 MG tablet Take 150 mg by mouth at bedtime.     SUMAtriptan  (IMITREX ) 100 MG tablet Take 1 tablet earliest onset of migraine.  May repeat in 2 hours. Maximum 2  tablets in 24 hours. 10 tablet 11   traZODone  (DESYREL ) 100 MG tablet Take 100 mg by mouth at bedtime.     No current facility-administered medications on file prior to visit.    ALLERGIES: Allergies  Allergen Reactions   Almond (Diagnostic) Anaphylaxis   Almond Oil Anaphylaxis and Other (See Comments)   Codeine  Other (See Comments)    Makes patient aggressive     Hydrocodone  Other (See Comments)    Makes patient aggressive     FAMILY HISTORY: Family History  Problem Relation Age of Onset   Depression Mother    Mental illness Mother    Diabetes Brother    Drug abuse Brother    Early death Brother    Mental illness Brother    Breast cancer Maternal Grandmother 30   Cancer Maternal Grandfather        head neck    Diabetes Maternal Grandfather    Lung cancer Maternal Grandfather    Cancer Paternal Grandmother        gallbladder    Colon cancer Paternal Grandfather 58   Diabetes Paternal Grandfather    Breast cancer Maternal Aunt 65      Objective:  *** General: No acute distress.  Patient appears ***-groomed.   Head:  Normocephalic/atraumatic Eyes:  Fundi examined but not visualized Neck: supple, no paraspinal tenderness, full range of motion Heart:  Regular rate and rhythm Lungs:  Clear to auscultation bilaterally Back: No paraspinal tenderness Neurological Exam: alert and oriented.  Speech fluent and not dysarthric, language intact.  CN II-XII intact. Bulk and tone normal, muscle strength 5/5 throughout.  Sensation to light touch intact.  Deep tendon reflexes 2+ throughout, toes downgoing.  Finger to nose testing intact.  Gait normal, Romberg negative.   Juliene Dunnings, DO  CC: ***

## 2024-01-06 ENCOUNTER — Ambulatory Visit: Payer: BC Managed Care – PPO | Admitting: Neurology

## 2024-01-23 ENCOUNTER — Other Ambulatory Visit: Payer: Self-pay | Admitting: *Deleted

## 2024-01-23 MED ORDER — ALBUTEROL SULFATE HFA 108 (90 BASE) MCG/ACT IN AERS: 2.0000 | INHALATION_SPRAY | Freq: Four times a day (QID) | RESPIRATORY_TRACT | 1 refills | Status: AC | PRN

## 2024-02-18 ENCOUNTER — Other Ambulatory Visit (HOSPITAL_COMMUNITY): Payer: Self-pay

## 2024-02-18 ENCOUNTER — Telehealth: Payer: Self-pay

## 2024-02-18 NOTE — Telephone Encounter (Signed)
 Pharmacy Patient Advocate Encounter   Received notification from CoverMyMeds that prior authorization for Emgality  120MG /ML auto-injectors (migraine) is required/requested.   Insurance verification completed.   The patient is insured through [facility] .   Per test claim: Prior Authorization form/request asks a question that requires your assistance. Please see the question below and advise accordingly. The PA will not be submitted until the necessary information is received.

## 2024-02-19 NOTE — Telephone Encounter (Signed)
My chart message sent to patient to get update.

## 2024-02-25 NOTE — Telephone Encounter (Signed)
 Pharmacy Patient Advocate Encounter  Received notification from [facility] that Prior Authorization for Emgality  120MG /ML auto-injectors (migraine) has been APPROVED from 02-23-2024 to 02-22-2025   PA #/Case ID/Reference #: [id number]

## 2024-03-08 ENCOUNTER — Telehealth: Payer: Self-pay | Admitting: Neurology

## 2024-03-08 NOTE — Telephone Encounter (Signed)
 Pt called in stating her migraines have worsened a little bit and she has been having some balance issues, and electric impulses zapping in her fingertips. She has also been noticing some cognitive issues. She has been having brain fog and word finding issues. She is a little worried and she thinks it is getting worse and would like to speak with someone.

## 2024-03-09 ENCOUNTER — Ambulatory Visit (INDEPENDENT_AMBULATORY_CARE_PROVIDER_SITE_OTHER): Admitting: Neurology

## 2024-03-09 ENCOUNTER — Telehealth: Payer: Self-pay

## 2024-03-09 ENCOUNTER — Other Ambulatory Visit

## 2024-03-09 ENCOUNTER — Encounter: Payer: Self-pay | Admitting: Neurology

## 2024-03-09 VITALS — BP 92/55 | HR 96 | Ht 64.0 in | Wt 151.0 lb

## 2024-03-09 DIAGNOSIS — R2681 Unsteadiness on feet: Secondary | ICD-10-CM

## 2024-03-09 DIAGNOSIS — R413 Other amnesia: Secondary | ICD-10-CM

## 2024-03-09 DIAGNOSIS — G251 Drug-induced tremor: Secondary | ICD-10-CM | POA: Diagnosis not present

## 2024-03-09 DIAGNOSIS — G43109 Migraine with aura, not intractable, without status migrainosus: Secondary | ICD-10-CM | POA: Diagnosis not present

## 2024-03-09 LAB — VITAMIN B12: Vitamin B-12: 213 pg/mL (ref 200–1100)

## 2024-03-09 MED ORDER — QULIPTA 60 MG PO TABS: 60.0000 mg | ORAL_TABLET | Freq: Every day | ORAL | 5 refills | Status: AC

## 2024-03-09 MED ORDER — PRIMIDONE 50 MG PO TABS: 25.0000 mg | ORAL_TABLET | Freq: Every day | ORAL | 5 refills | Status: AC

## 2024-03-09 NOTE — Patient Instructions (Addendum)
 Further testing: MRI of brain with and without contrast. We have sent a referral to [facility] for your MRI and they will call you directly to schedule your appointment. They are located at [address] [facility]. If you need to contact them directly please call [phone].  Check B12 level. Your provider has requested that you have labwork completed today. Please go to [facility] ([id number]) on the second floor of this building before leaving the office today. You do not need to check in. If you are not called within 15 minutes please check with the front desk.   Stop Emgality .  Plan to start Qulipta  60mg  pill daily. Please give us  a call or send a mychart message if you are unable to pick up.  Sumatriptan  as needed for migraine attacks.   For tremor, start primidone  50mg  - take 1/2 tablet at bedtime.  If no improvement in 2 weeks, contact me Keep follow up in February

## 2024-03-09 NOTE — Progress Notes (Signed)
 NEUROLOGY FOLLOW UP OFFICE NOTE  [name] [id number]  Assessment/Plan:  All fairly longstanding symptoms which have gotten worse over the last few months.  Unclear if it may be stress-induced.   Migraine with aura, without status migrainosus, not intractable  Tremor - suspect drug-induced secondary to Lithium .   Gait instability Memory difficulty     Further workup warranted: MRI of brain with and without contrast Check B12 level Migraine prevention:  Stop Emgality .  Plan to start Qulipta  60mg  daily Migraine rescue:  sumatriptan  100mg .  Re: tremor:  Discontinuing Lithium  is not an option.  Will see if it can respond to primidone  (take 25mg  at bedtime.  If no improvement in 2 weeks, we can increase dose to 50mg  at bedtime).  Due to baseline hypotension, cannot use propranolol. Limit use of pain relievers to no more than 9 days out of the month to prevent risk of rebound or medication-overuse headache. Keep headache diary Follow up in February as scheduled.  Total time spent on today's visit was 44 minutes dedicated to this patient today, preparing to see patient, examining the patient, ordering tests and/or medications and counseling the patient, documenting clinical information in the EHR or other health record, independently interpreting results and communicating results to the patient/family, discussing treatment and goals, answering patient's questions and coordinating care.    Subjective:  Sheila Mathews is a 48 year old right-handed woman history of migraines, panic attacks, anxiety and allergic rhinitis who follows up for migraines.   UPDATE: Over the past several months, she has seen worsening of symptoms.  Tremor: Tremor in hands worse.  Interferes with activities such as writing, holding utensils or a cup.  When severe, she feels the tremor up her arm.  Not at rest.  Labs from 7/28 revealed Lithium  level 0.8, TSH 4.934, vit D 60.6, unremarkable CMP  Gait  instability:  She feels more unsteady on her feet.  No sensation of dizziness or weakness in the legs.  She just becomes off balance.  She has had 1 fall.  She followed up with her orthopedist who told her it is not her back.    Memory problems: She has had some word-finding difficulty for a while but has gotten worse over the last few months.  She once tried to explain over the phone to her husband what she was doing and couldn't remember the name for trashbag.  She finds herself unable to finish a thought.  She sometimes has to stop reading in the middle of a sentence because she may not recognize a word or know how to pronounce it.    Migraines: Migraines have also increased.  She thinks it is primarily because her pharmacy can never get the Emgality  in on-time.    She denies new medications.  She has noted some slight increased emotional stress but doesn't think it is anything significant.    Current NSAIDS:  none Current analgesics:  none Current triptans:  Sumatriptan  100mg  Current ergotamine:  none Current anti-emetic:  none Current muscle relaxants:  Robaxin  Current anti-anxiolytic:  Klonopin  Current sleep aide:  none Current Antihypertensive medications:  none Current Antidepressant/antipsychotic/mood medications:  Sertraline  150mg , Lithium  Current Anticonvulsant medications:  lamotrigine  200mg  daily, gabapentin  300mg  TID PRN Current anti-CGRP:  Emgality  Current Vitamins/Herbal/Supplements:  ferrous sulfate  Current Antihistamines/Decongestants:  Flonase  Other therapy:  none Hormone/birth control: Mirena  Other medications:  Synthroid , Klonopin , trazodone    Caffeine:  1 cup of coffee per day Alcohol:  Occasionally Smoker:  No Diet:  8 healthy. Drinks a lot of water. Exercise:  Good Depression/stress:  Anxiety controlled Other pain:  Back pain - plan for surgery. Sleep hygiene:  Wakes up during the night and difficulty falling back asleep   HISTORY:  Migraines: Onset:   48 years old Location:  Usually right-sided, retro-orbital, and moves to the top of the head Quality:  Usually pressure-like, sometimes stabbing Initial Intensity:  Usually 6-7/10, 10 out of 10 for severe Aura:  Sometimes preceded by fuzzy and tunnel vision - when closes eyes she sees flashes of light.  Prodrome:  No Associated symptoms:  First severe, experiences nausea, blurred vision, photophobia, phonophobia, and osmophobia. Initial Duration:  Severe attacks last up to 4 days. Otherwise 2-3 hours Initial Frequency:  Severe attacks occur once a month (4 days per month), otherwise other headaches occur 2 days per week. Total of 15-18 headache days per month. Triggers/exacerbating factors:  Menstrual cycle Relieving factors:  Phenergan /Toradol  shots Activity:  Cannot function with severe attacks   For years, she had headaches occurring 2 days a week, lasting 2-3 hours, but had severe migraines once a month lasting 4 days.  In 2019, they started occurring only twice a month but still lasting 4 days.  Tremor: She noticed shakiness in the hands beginning in early 2023. It sometimes happens when resting but usually when using her hands or holding something. Nothing makes it worse. Holding onto her hand with the other hand will settle it. Has hypothyroidism which is adequately controlled.  Has been on Lithium  long before onset of symptoms.   Past NSAIDS:  Ketorolac , naproxen  Past analgesics:  Excedrin, Extra-strength Tylenol  Past abortive triptans:  Relpax  40mg , Maxalt 10mg , sumatriptan  injection Past abortive ergotamine:  none Past muscle relaxants:  Flexeril Past anti-emetic:  Zofran  4mg  Past antihypertensive medications:  No beta blockers as BP and HR runs low Past antidepressant medications:  amitriptline 10mg  (side effects), citalopram Past anticonvulsant medications:  Depakote, topiramate , gabapentin  (caused brain fog) Past anti-CGRP:  Aimovig  (effective), Ubrelvy  100mg  (ineffective),  Nurtec (rescue- ineffective), Zavzpret NS (side effects) Other past therapies:  non     Family history of headache:  No  PAST MEDICAL HISTORY: Past Medical History:  Diagnosis Mathews   Abnormal cervical Papanicolaou smear 04/20/2020   Allergies    Anemia    with pregnancy , resolved after delivery   Anxiety    Asthma 11/05/2022   mild   Bipolar disorder (HCC)    Chicken pox    Chronic sinusitis 07/26/2016   no current problems as of 12/05/22   Complication of anesthesia    pt doesn't like mask she has panic attack; childhood, no problems as an adult   Depression    Dyspnea    with intermittent chest tightness at times, has used daughter's albuterol  inhaler which has helped per patient 12/05/22   Family history of adverse reaction to anesthesia    Mother PONV   GERD (gastroesophageal reflux disease)    Hypothyroidism    Lung nodule 01/2023   pulmonary adenoma   Migraines    Nonsmoker    Pelvic pain in female 05/13/2014   resolved, no current problem as of 12/05/22   Pneumonia    x 1   PONV (postoperative nausea and vomiting)    no issues with short procdures, had one isntance of vomitus following a longer procedure    Sciatica     MEDICATIONS: Current Outpatient Medications on File Prior to Visit  Medication Sig Dispense Refill   albuterol  (  VENTOLIN  HFA) 108 (90 Base) MCG/ACT inhaler Inhale 2 puffs into the lungs every 6 (six) hours as needed for wheezing or shortness of breath. 8.5 each 1   clonazePAM  (KLONOPIN ) 0.5 MG tablet Take 0.5 mg by mouth 3 (three) times daily as needed for anxiety.     EMGALITY  120 MG/ML SOAJ Inject 120 mg into the skin every 28 (twenty-eight) days. 1.12 mL 11   esomeprazole (NEXIUM) 20 MG capsule Take 20 mg by mouth daily.     ferrous sulfate  325 (65 FE) MG tablet Take 325 mg by mouth daily with breakfast.     fluticasone  (FLONASE ) 50 MCG/ACT nasal spray Place 2 sprays into both nostrils daily.     gabapentin  (NEURONTIN ) 300 MG capsule Take 300 mg  by mouth 3 (three) times daily.     lamoTRIgine  (LAMICTAL ) 200 MG tablet Take 200 mg by mouth at bedtime.     levonorgestrel  (MIRENA , 52 MG,) 20 MCG/DAY IUD 1 each by Intrauterine route once. Every 8 Years, Placed 2021     levothyroxine  (SYNTHROID ) 25 MCG tablet 1 tab daily p.o. on an empty stomach x6 days a week, 2 tabs p.o. 1 day a week. (Patient taking differently: Take 25-50 mcg by mouth See admin instructions. 25 mcg 6 days a week each morning, 50 mcg Sunday morning ONLY) 102 tablet 3   lithium  carbonate (LITHOBID ) 300 MG ER tablet Take 900 mg by mouth every evening.     naproxen  (NAPROSYN ) 500 MG tablet Take 1 tablet with 1 tablet of sumatriptan  if you wake up with migraine.  May repeat once after 2 hours if needed. 20 tablet 5   sertraline  (ZOLOFT ) 100 MG tablet Take 150 mg by mouth at bedtime.     SUMAtriptan  (IMITREX ) 100 MG tablet Take 1 tablet earliest onset of migraine.  May repeat in 2 hours. Maximum 2 tablets in 24 hours. 10 tablet 11   traZODone  (DESYREL ) 100 MG tablet Take 100 mg by mouth at bedtime.     No current facility-administered medications on file prior to visit.    ALLERGIES: Allergies  Allergen Reactions   Almond (Diagnostic) Anaphylaxis   Almond Oil Anaphylaxis and Other (See Comments)   Codeine  Other (See Comments)    Makes patient aggressive     Hydrocodone  Other (See Comments)    Makes patient aggressive     FAMILY HISTORY: Family History  Problem Relation Age of Onset   Depression Mother    Mental illness Mother    Diabetes Brother    Drug abuse Brother    Early death Brother    Mental illness Brother    Breast cancer Maternal Grandmother 30   Cancer Maternal Grandfather        head neck    Diabetes Maternal Grandfather    Lung cancer Maternal Grandfather    Cancer Paternal Grandmother        gallbladder   Colon cancer Paternal Grandfather 34   Diabetes Paternal Grandfather    Breast cancer Maternal Aunt 65      Objective:  Blood  pressure (!) 92/55, pulse 96, height 5' 4 (1.626 m), weight 151 lb (68.5 kg). General: No acute distress.  Patient appears well-groomed.   Head:  Normocephalic/atraumatic Eyes:  Fundi examined but not visualized Neck: supple, no paraspinal tenderness, full range of motion Heart:  Regular rate and rhythm Neurological Exam: alert and oriented.  Speech fluent and not dysarthric, language intact.  CN II-XII intact. Bulk and tone normal, muscle strength 5/5  throughout.  Sensation to light touch intact.  Deep tendon reflexes brisk throughout, Babinski absent, Hoffman absent.  Finger to nose testing intact.  Wide-based gait.  Unable to tandem walk.  Romberg with mild sway.   Juliene Dunnings, DO  CC: Natalie James, PA-C

## 2024-03-09 NOTE — Telephone Encounter (Signed)
 Qulipta  Pa needed.

## 2024-03-10 ENCOUNTER — Ambulatory Visit: Payer: Self-pay | Admitting: Neurology

## 2024-03-10 ENCOUNTER — Other Ambulatory Visit (HOSPITAL_COMMUNITY): Payer: Self-pay

## 2024-03-10 ENCOUNTER — Telehealth: Payer: Self-pay | Admitting: Pharmacy Technician

## 2024-03-10 NOTE — Telephone Encounter (Signed)
 Pharmacy Patient Advocate Encounter   Received notification from Pt Calls Messages that prior authorization for QULIPTA  60MG  is required/requested.   Insurance verification completed.   The patient is insured through [facility] .   Per test claim: PA required; PA submitted to above mentioned insurance via Latent Key/confirmation #/EOC [id number] Status is pending

## 2024-03-10 NOTE — Telephone Encounter (Signed)
 PA has been submitted, and telephone encounter has been created. Please see telephone encounter dated 9.10.25.

## 2024-03-11 NOTE — Progress Notes (Signed)
 Patient advised.

## 2024-03-11 NOTE — Telephone Encounter (Signed)
 Pharmacy Patient Advocate Encounter  Received notification from [facility] that Prior Authorization for QULIPTA  60MG  has been APPROVED from 03-10-2024 to 06-02-2024   PA #/Case ID/Reference #: [id number]

## 2024-03-18 ENCOUNTER — Other Ambulatory Visit: Payer: Self-pay | Admitting: Orthopedic Surgery

## 2024-03-18 ENCOUNTER — Other Ambulatory Visit: Payer: Self-pay | Admitting: Neurology

## 2024-03-18 DIAGNOSIS — M545 Low back pain, unspecified: Secondary | ICD-10-CM

## 2024-03-19 ENCOUNTER — Ambulatory Visit
Admission: RE | Admit: 2024-03-19 | Discharge: 2024-03-19 | Disposition: A | Discharge status: Home / Self Care | Source: Ambulatory Visit | Attending: Neurology | Admitting: Neurology

## 2024-03-19 ENCOUNTER — Other Ambulatory Visit (HOSPITAL_COMMUNITY): Payer: Self-pay

## 2024-03-19 DIAGNOSIS — R2681 Unsteadiness on feet: Secondary | ICD-10-CM

## 2024-03-19 DIAGNOSIS — R413 Other amnesia: Secondary | ICD-10-CM

## 2024-03-19 MED ORDER — GADOPICLENOL 0.5 MMOL/ML IV SOLN
7.5000 mL | Freq: Once | INTRAVENOUS | Status: AC | PRN
  Administered 2024-03-19: 7.5 mL via INTRAVENOUS

## 2024-03-21 ENCOUNTER — Other Ambulatory Visit: Payer: Self-pay | Admitting: Acute Care

## 2024-03-25 ENCOUNTER — Encounter: Payer: Self-pay | Admitting: Neurology

## 2024-03-30 NOTE — Discharge Instructions (Signed)

## 2024-03-31 ENCOUNTER — Ambulatory Visit
Admission: RE | Admit: 2024-03-31 | Discharge: 2024-03-31 | Disposition: A | Source: Ambulatory Visit | Attending: Orthopedic Surgery | Admitting: Orthopedic Surgery

## 2024-03-31 ENCOUNTER — Inpatient Hospital Stay
Admission: RE | Admit: 2024-03-31 | Discharge: 2024-03-31 | Disposition: A | Source: Ambulatory Visit | Attending: Orthopedic Surgery | Admitting: Orthopedic Surgery

## 2024-03-31 DIAGNOSIS — M545 Low back pain, unspecified: Secondary | ICD-10-CM

## 2024-03-31 MED ORDER — ONDANSETRON HCL 4 MG/2ML IJ SOLN: 4.0000 mg | Freq: Once | INTRAMUSCULAR | Status: DC | PRN

## 2024-03-31 MED ORDER — MEPERIDINE HCL 50 MG/ML IJ SOLN: 50.0000 mg | Freq: Once | INTRAMUSCULAR | Status: DC | PRN

## 2024-03-31 MED ORDER — IOPAMIDOL (ISOVUE-M 200) INJECTION 41%
20.0000 mL | Freq: Once | INTRAMUSCULAR | Status: AC
Start: 2024-03-31 — End: 2024-03-31
  Administered 2024-03-31: 15 mL via INTRATHECAL

## 2024-03-31 MED ORDER — DIAZEPAM 5 MG PO TABS
10.0000 mg | ORAL_TABLET | Freq: Once | ORAL | Status: AC
  Administered 2024-03-31: 10 mg via ORAL

## 2024-03-31 NOTE — Procedures (Signed)
 Consent for Operation or Procedure: Provider Certification I hereby certify that the nature, purpose, benefits, usual and most frequent risks of, and alternatives to, the operation or procedure have been explained to the patient (or person authorized to sign for the patient) either by a physician or by the provider who is to perform the operation or procedure, that the patient has had an opportunity to ask questions, and that those questions have been answered. The patient or the patient's representative has been advised that selected tasks may be performed by assistants to the primary health care provider(s). I believe that the patient (or person authorized to sign for the patient) understands what has been explained, and has consented to the operation or procedure.   PROCEDURE SUMMARY:   Successful fluoro guided lumbar myelogram.  EBL: less than 1 mL   Please see full dictation in imaging section in Epic for details.    Sheila Mathews H Mikkel Charrette PA-C 03/31/2024 9:48 AM

## 2024-04-02 ENCOUNTER — Other Ambulatory Visit: Payer: Self-pay | Admitting: Orthopedic Surgery

## 2024-04-02 ENCOUNTER — Telehealth: Payer: Self-pay

## 2024-04-02 ENCOUNTER — Ambulatory Visit
Admission: RE | Admit: 2024-04-02 | Discharge: 2024-04-02 | Disposition: A | Discharge status: Home / Self Care | Source: Ambulatory Visit | Attending: Orthopedic Surgery | Admitting: Orthopedic Surgery

## 2024-04-02 DIAGNOSIS — G971 Other reaction to spinal and lumbar puncture: Secondary | ICD-10-CM

## 2024-04-02 MED ORDER — IOPAMIDOL (ISOVUE-M 200) INJECTION 41%
1.0000 mL | Freq: Once | INTRAMUSCULAR | Status: AC
Start: 2024-04-02 — End: 2024-04-02
  Administered 2024-04-02: 1 mL via EPIDURAL

## 2024-04-02 NOTE — Progress Notes (Signed)
20cc blood collected from pts RAC prior to blood patch procedure. Pt tolerated well. 1 successful attempt. Gauze and tape applied after. 

## 2024-04-02 NOTE — Discharge Instructions (Signed)
 Blood Patch Discharge Instructions ? ?Go home and rest quietly for the next 24 hours.  It is important to lie flat for the next 24 hours.  Get up only to go to the restroom.  You may lie in the bed or on a couch on your back, your stomach, your left side or your right side.  You may have one pillow under your head.  You may have pillows between your knees while you are on your side or under your knees while you are on your back. ? ?DO NOT drive today.  Recline the seat as far back as it will go, while still wearing your seat belt, on the way home. ? ?You may get up to go to the bathroom as needed.  You may sit up for 10 minutes to eat.  You may resume your normal diet and medications unless otherwise indicated.  Drink lots of extra fluids today and tomorrow..  ? ?You may resume normal activities after your 24 hours of bed rest is over; however, do not exert yourself strongly or do any heavy lifting tomorrow. ? ?Call your physician for a follow-up appointment.  ? ?If you have any questions  after you arrive home, please call [phone]. ? ?Discharge instructions have been explained to the patient.  The patient, or the person responsible for the patient, fully understands these instructions. ? ?   ?

## 2024-04-14 ENCOUNTER — Other Ambulatory Visit: Payer: Self-pay | Admitting: Orthopedic Surgery

## 2024-04-14 DIAGNOSIS — M542 Cervicalgia: Secondary | ICD-10-CM

## 2024-04-28 ENCOUNTER — Encounter: Payer: Self-pay | Admitting: Orthopedic Surgery

## 2024-04-30 NOTE — Discharge Instructions (Signed)

## 2024-05-03 ENCOUNTER — Ambulatory Visit
Admission: RE | Admit: 2024-05-03 | Discharge: 2024-05-03 | Disposition: A | Source: Ambulatory Visit | Attending: Orthopedic Surgery | Admitting: Orthopedic Surgery

## 2024-05-03 ENCOUNTER — Other Ambulatory Visit: Payer: Self-pay | Admitting: Orthopedic Surgery

## 2024-05-03 DIAGNOSIS — M542 Cervicalgia: Secondary | ICD-10-CM

## 2024-05-03 DIAGNOSIS — M5416 Radiculopathy, lumbar region: Secondary | ICD-10-CM

## 2024-05-03 MED ORDER — METHYLPREDNISOLONE ACETATE 40 MG/ML INJ SUSP (RADIOLOG
80.0000 mg | Freq: Once | INTRAMUSCULAR | Status: AC
  Administered 2024-05-03: 80 mg via EPIDURAL

## 2024-05-03 MED ORDER — IOPAMIDOL (ISOVUE-M 200) INJECTION 41%
1.0000 mL | Freq: Once | INTRAMUSCULAR | Status: AC
Start: 2024-05-03 — End: 2024-05-03
  Administered 2024-05-03: 1 mL via EPIDURAL

## 2024-05-03 MED ORDER — METHYLPREDNISOLONE ACETATE 40 MG/ML INJ SUSP (RADIOLOG
40.0000 mg | Freq: Once | INTRAMUSCULAR | Status: AC
  Administered 2024-05-03: 40 mg via EPIDURAL

## 2024-05-13 NOTE — Progress Notes (Signed)
 Subjective Patient ID: Sheila Mathews is a 48 y.o. female.  Chief Complaint  Patient presents with   Headache    On going since Monday with a headache, nausea and light sensitivity.    Nausea    The following information was reviewed by members of the visit team:  Tobacco  Allergies  Meds  Med Hx  Surg Hx  OB Status  Fam Hx  Soc  Hx     48 year old female presents for evaluation of migraine headache.  Symptoms began 3 days ago.  Reports diffuse headache with associated nausea and light sensitivity.  She has a history of chronic migraines and states current symptoms are unchanged from previous headaches.  She denies recent injury to the head.  No recent fever, chills or unusual neck pain or stiffness.  No other recent illness.  She has been using prescribed medications for migraines without relief of symptoms.    Review of Systems  Constitutional:  Negative for chills and fever.  HENT:  Negative for congestion, ear pain, rhinorrhea, sinus pressure, sinus pain and sore throat.   Eyes:  Positive for photophobia. Negative for redness and visual disturbance.  Respiratory:  Negative for cough and shortness of breath.   Cardiovascular:  Negative for chest pain.  Gastrointestinal:  Positive for nausea and vomiting. Negative for diarrhea.  Musculoskeletal:  Negative for arthralgias, myalgias, neck pain and neck stiffness.  Skin:  Negative for rash and wound.  Neurological:  Positive for headaches. Negative for dizziness and numbness.  Hematological:  Negative for adenopathy. Does not bruise/bleed easily.    Objective Physical Exam Vitals reviewed.  Constitutional:      General: She is not in acute distress.    Appearance: Normal appearance. She is not ill-appearing, toxic-appearing or diaphoretic.  HENT:     Head: Normocephalic and atraumatic.     Right Ear: Tympanic membrane, ear canal and external ear normal.     Left Ear: Tympanic membrane, ear canal and external  ear normal.     Nose: Nose normal. No congestion or rhinorrhea.     Mouth/Throat:     Mouth: Mucous membranes are moist.     Pharynx: No oropharyngeal exudate or posterior oropharyngeal erythema.  Eyes:     Extraocular Movements: Extraocular movements intact.     Conjunctiva/sclera: Conjunctivae normal.     Pupils: Pupils are equal, round, and reactive to light.  Cardiovascular:     Rate and Rhythm: Normal rate and regular rhythm.     Pulses: Normal pulses.  Pulmonary:     Effort: Pulmonary effort is normal. No respiratory distress.     Breath sounds: Normal breath sounds.  Musculoskeletal:        General: Normal range of motion.     Cervical back: Normal range of motion and neck supple. No rigidity.  Skin:    General: Skin is warm and dry.     Capillary Refill: Capillary refill takes less than 2 seconds.     Findings: No rash.  Neurological:     General: No focal deficit present.     Mental Status: She is alert and oriented to person, place, and time.     Cranial Nerves: No cranial nerve deficit.  Psychiatric:        Mood and Affect: Mood normal.     Assessment/Plan 48 year old female with history of chronic migraines 3-day of headache Patient reports her symptoms are consistent with previous migraines, no new or different symptoms. No recent injury, no  recent fever Full exam is unremarkable Offered migraine cocktail for treatment. Patient request medications to be given IV as IM medications tend not to work as well for her Patient given Reglan , Toradol , Benadryl , dexamethasone  IV for symptoms Patient reassessed after treatment and states she is feeling better Recommend continued use of prescribed medications for migraine at home Also recommend follow-up with PCP and/or neurologist as needed for persistent symptoms Urgent Care Disposition:  Home Care   Electronically signed: Franky Floria Finder, PA-C 05/13/2024  12:02 PM

## 2024-05-21 NOTE — Progress Notes (Signed)
 [address] - AMBULATORY [facility]  - URGENT [facility] [place] [address] [place] [id number]   Date of Service: 05/21/2024 Patient DOB: [birth year]    History of Present Illness   Patient ID: Sarenity Ramaker is a 48 y.o. female. Patient presents to urgent care due to URI sx 5 days.  Reports fever with Tmax of 101, headache, facial pain and pressure, sore throat, cough, ear pressure.  States that she has been taking OTC medications which does help for short periods of time.  Denies sick contacts.  States that she does have a history of chronic sinusitis in which she has seen an ENT specialist.  States that she typically gets sinusitis with URI and is concerned.  Denies body aches, chills, dysphagia, drooling, neck pain, vision changes, chest pain, shortness of breath, wheezing, nausea, vomiting.  Active Ambulatory Problems    Diagnosis Date Noted   Chronic sinusitis 07/26/2016   Laryngopharyngeal reflux (LPR) 07/10/2017   Post-nasal drainage 07/10/2017   Anxiety 09/14/2020   Bipolar 1 disorder (HCC) 05/05/2020   Hyperlipidemia 09/14/2020   Acquired hypothyroidism 11/15/2022   Iron deficiency 11/15/2022   Chronic migraine with aura, not intractable, without status migrainosus 11/15/2022   Severe obesity (BMI 35.0-35.9 with comorbidity) (CMD) 11/15/2022   Bronchial adenoma 02/06/2023   Other adverse food reactions, not elsewhere classified, subsequent encounter 01/22/2023   Lung nodules 12/30/2022   Other atopic dermatitis 01/22/2023   Uncomplicated asthma (CMD) 01/22/2023   Urticaria 01/22/2023   Other allergic rhinitis 09/14/2020   Mixed stress and urge urinary incontinence 11/14/2023   Full incontinence of feces 11/14/2023   Resolved Ambulatory Problems    Diagnosis Date Noted   No Resolved Ambulatory Problems   Past Medical History:  Diagnosis Date   Allergic    Allergic rhinitis    Anemia     Anxiety and depression    Asthma (CMD)    Drug-induced tremor    Eczema    GERD (gastroesophageal reflux disease)    Headache    Hypothyroid    IDA (iron deficiency anemia)    Migraine    Obesity    Obesity    Pneumonia    PONV (postoperative nausea and vomiting)    Sciatica    Varicella    Visual impairment     BP 107/50   Pulse 56   Wt 70.6 kg (155 lb 11.2 oz)   BMI 26.73 kg/m    Review of Systems   Review of Systems  Constitutional:  Positive for fatigue and fever.  HENT:  Positive for congestion, ear pain, sinus pressure, sinus pain and sore throat.   Respiratory:  Positive for cough.   Neurological:  Positive for headaches.     Physicial Exam   Physical Exam Vitals and nursing note reviewed.  Constitutional:      Appearance: Normal appearance. She is normal weight.  HENT:     Head: Normocephalic and atraumatic.     Jaw: No trismus or swelling.     Comments: Hoarseness present. TTP of left maxillary sinus    Right Ear: Tympanic membrane, ear canal and external ear normal. No drainage. No mastoid tenderness. Tympanic membrane is not retracted or bulging.     Left Ear: Tympanic membrane, ear canal and external ear normal. No drainage. No mastoid tenderness. Tympanic membrane is not retracted or bulging.     Nose: Nose normal.     Mouth/Throat:  Lips: Pink.     Mouth: Mucous membranes are moist.     Dentition: Normal dentition.     Tongue: No lesions.     Palate: No lesions.     Pharynx: Oropharynx is clear. Uvula midline. Posterior oropharyngeal erythema present. No oropharyngeal exudate.     Tonsils: No tonsillar exudate or tonsillar abscesses.  Eyes:     Extraocular Movements: Extraocular movements intact.     Conjunctiva/sclera: Conjunctivae normal.  Cardiovascular:     Rate and Rhythm: Normal rate and regular rhythm.     Pulses: Normal pulses.     Heart sounds: Normal heart sounds.  Pulmonary:     Effort: Pulmonary effort is  normal.     Breath sounds: Normal breath sounds.  Musculoskeletal:        General: Normal range of motion.     Cervical back: Normal range of motion and neck supple. No tenderness.  Lymphadenopathy:     Cervical: No cervical adenopathy.  Skin:    General: Skin is warm and dry.  Neurological:     Mental Status: She is alert.  Psychiatric:        Mood and Affect: Mood normal.      Labs   Recent Results (from the past 24 hours)  POC Influenza A&B NAT (IDNOW)   Collection Time: 05/21/24  3:34 PM  Result Value Ref Range   Influenza A RNA Negative Negative   Influenza B RNA Negative Negative  POC Rapid Strep A   Collection Time: 05/21/24  3:34 PM  Result Value Ref Range   Strep A Antigen Negative Negative   Internal Control Acceptable    Kit/Device Lot # [id number]    Kit/Device Expiration Date 06/30/2025   POC SARS-COV-2 SYMPTOMATIC (IDNOW)   Collection Time: 05/21/24  3:35 PM  Result Value Ref Range   SARS-COV-2 IDNOW Negative Negative   SARS IDNOW Information      A rapid, molecular diagnostic test on the IDNOW.  Negative results should be treated as presumptive and, if inconsistent with clinical symptoms should be confirmed with an alternative molecular assay.     Diagnosis   Santiana Glidden was seen today for otalgia, cough, nasal congestion, sore throat and fever.  Diagnoses and all orders for this visit:  Upper respiratory tract infection, unspecified type -     POC Influenza A&B NAT (IDNOW) -     POC SARS-COV-2 SYMPTOMATIC (IDNOW) -     POC Rapid Strep A -     amoxicillin -pot clavulanate (AUGMENTIN) 875-125 mg per tablet; Take 1 tablet by mouth 2 (two) times a day for 7 days.     Medical Decision Making Nitya Tyechia Allmendinger is a 48 y.o. female who presents to urgent care today with complaints of  Chief Complaint  Patient presents with   Otalgia    Pt reports L side otalgia, L side facial pain, and L side sore throat that began on Sunday.  Also reports   laryngitis, cough, congestion, and HA.  States same has been getting progressively worse.  Reports fever as well.  States has some minor relief with Dayquil.   Cough   Nasal Congestion   Sore Throat   Fever    On exam, VS stable and patient is afebrile. Appears well-hydrated and capillary refill <2 seconds.  DDX: Epiglottitis, Peritonsillar abscess, Retropharyngeal abscess,  Pneumonia, Meningitis, Sepsis, more likely Viral or Bacterial URI, Sinusitis, Bronchitis, Strep    Patient presents to urgent care due to URI symptoms.  Rapid COVID, flu, strep negative.  Patient is not appear to exhibit any hypoxia, tachycardia, tachypnea, Avva distress sounds at this time.  Patient presentation most consistent with viral URI.  Given that patient has history of chronic sinusitis precipitated by URI, will prescribe Augmentin that she can start in 4 to 5 days if no improvement with OTC and supportive therapies.  Discharge Information  Symptomatic management discussed.  Patient was given verbal and written instructions on symptoms that necessitate return to the UC/ED, and instructed to f/u w/ UC or PCP if not improving in expected timeframe.   Patient/parent has been instructed on RX/OTC medications, dosages, side effects, and possible interactions as associated with each diagnosis in my impression and plan above.   Patient education (verbal/handout) given on diagnosis, pathophysiology, treatment of diagnosis, side effects of medication use for treatment, restrictions while taking medication, supportives measures such as staying hydrated.   Red Flags associated with diagnosis/es were reviewed and patient instructed on action plan if red flags develop.   They have been instructed that if symptoms worsen or red flags develop they should return to Urgent Care, go to the nearest ED, or activate EMS/911.     Patient and/or parent/guardian (if applicable) agreed with plan and voiced understanding.  No barriers  to adherence perceived by myself.   Portions of this note may have been dictated using Dragon dictation software/hardware and may contain grammatical or spelling errors.    If a new prescription was given today, then I discussed potential side effects, drug interactions, instructions for taking the medication, and the consequences of not taking it.    F/u: Follow up closely with primary care provider (PCP) and other specialists for further care and routine care, but seek medical attention sooner if worsening/concerning signs or symptoms.  Home Care   Electronically signed by: Darryle Slater Fish, PA-C 05/21/2024 4:01 PM

## 2024-06-04 NOTE — Progress Notes (Signed)
 Labs show no infection or anemia. Thyroid  function is normal. All clotting numbers are normal, indicating no abnormality that may be leading to easy bruising, which is reassuring.

## 2024-06-09 ENCOUNTER — Other Ambulatory Visit: Payer: Self-pay

## 2024-06-09 ENCOUNTER — Emergency Department (HOSPITAL_BASED_OUTPATIENT_CLINIC_OR_DEPARTMENT_OTHER)
Admission: EM | Admit: 2024-06-09 | Discharge: 2024-06-10 | Discharge status: Against Medical Advice | Attending: Emergency Medicine | Admitting: Emergency Medicine

## 2024-06-09 ENCOUNTER — Encounter (HOSPITAL_BASED_OUTPATIENT_CLINIC_OR_DEPARTMENT_OTHER): Payer: Self-pay

## 2024-06-09 DIAGNOSIS — R159 Full incontinence of feces: Secondary | ICD-10-CM | POA: Insufficient documentation

## 2024-06-09 LAB — CBC WITH DIFFERENTIAL/PLATELET
Abs Immature Granulocytes: 0.05 K/uL (ref 0.00–0.07)
Basophils Absolute: 0 K/uL (ref 0.0–0.1)
Basophils Relative: 0 %
Eosinophils Absolute: 0 K/uL (ref 0.0–0.5)
Eosinophils Relative: 0 %
HCT: 36.5 % (ref 36.0–46.0)
Hemoglobin: 11.8 g/dL — ABNORMAL LOW (ref 12.0–15.0)
Immature Granulocytes: 1 %
Lymphocytes Relative: 20 %
Lymphs Abs: 2 K/uL (ref 0.7–4.0)
MCH: 31 pg (ref 26.0–34.0)
MCHC: 32.3 g/dL (ref 30.0–36.0)
MCV: 95.8 fL (ref 80.0–100.0)
Monocytes Absolute: 0.7 K/uL (ref 0.1–1.0)
Monocytes Relative: 7 %
Neutro Abs: 7.3 K/uL (ref 1.7–7.7)
Neutrophils Relative %: 72 %
Platelets: 316 K/uL (ref 150–400)
RBC: 3.81 MIL/uL — ABNORMAL LOW (ref 3.87–5.11)
RDW: 13.8 % (ref 11.5–15.5)
WBC: 10 K/uL (ref 4.0–10.5)
nRBC: 0 % (ref 0.0–0.2)

## 2024-06-09 LAB — COMPREHENSIVE METABOLIC PANEL WITH GFR
ALT: <5 U/L (ref 0–44)
AST: 15 U/L (ref 15–41)
Albumin: 4.5 g/dL (ref 3.5–5.0)
Alkaline Phosphatase: 67 U/L (ref 38–126)
Anion gap: 10 (ref 5–15)
BUN: 10 mg/dL (ref 6–20)
CO2: 24 mmol/L (ref 22–32)
Calcium: 9.5 mg/dL (ref 8.9–10.3)
Chloride: 103 mmol/L (ref 98–111)
Creatinine, Ser: 0.7 mg/dL (ref 0.44–1.00)
GFR, Estimated: >60 mL/min (ref >60)
Glucose, Bld: 92 mg/dL (ref 70–99)
Potassium: 4.7 mmol/L (ref 3.5–5.1)
Sodium: 137 mmol/L (ref 135–145)
Total Bilirubin: 0.5 mg/dL (ref 0.0–1.2)
Total Protein: 7 g/dL (ref 6.5–8.1)

## 2024-06-09 NOTE — ED Provider Notes (Signed)
 [place] EMERGENCY DEPARTMENT AT [facility] Provider Note   CSN: [id number] Arrival date & time: 06/09/24  2119     Patient presents with: Numbness (BLE)   Sheila Mathews is a 48 y.o. female.   48 yo F with a chief complaints of low back pain that radiates to bilateral lower extremities urinary and fecal incontinence.  The patient states that she had the spinal injection done about 4 weeks ago.  Had some transient urinary incontinence that seem to have resolved.  She discussed this with her spinal surgeon who told her if it happened again she needed to come emergently to the hospital for MRI.  She started losing control of her bowel yesterday.  She tells me the pain in her back is a bit worse over the past week.  Feels like the radicular symptoms to bilateral lower extremities is at baseline.  No fevers.  Other than the spinal injection 4 weeks ago no other injection.  No trauma.        Prior to Admission medications   Medication Sig Start Date End Date Taking? Authorizing Provider  albuterol  (VENTOLIN  HFA) 108 (90 Base) MCG/ACT inhaler Inhale 2 puffs into the lungs every 6 (six) hours as needed for wheezing or shortness of breath. 01/23/24   Ruthell Lauraine FALCON, NP  Atogepant  (QULIPTA ) 60 MG TABS Take 1 tablet (60 mg total) by mouth daily. 03/09/24   Skeet Juliene SAUNDERS, DO  clonazePAM  (KLONOPIN ) 0.5 MG tablet Take 0.5 mg by mouth 3 (three) times daily as needed for anxiety.    [provider]  esomeprazole (NEXIUM) 20 MG capsule Take 20 mg by mouth daily.    [provider]  ferrous sulfate  325 (65 FE) MG tablet Take 325 mg by mouth daily with breakfast.    [provider]  fluticasone  (FLONASE ) 50 MCG/ACT nasal spray Place 2 sprays into both nostrils daily.    [provider]  gabapentin  (NEURONTIN ) 300 MG capsule Take 300 mg by mouth 3 (three) times daily. 05/16/22   [provider]  lamoTRIgine  (LAMICTAL ) 200 MG tablet Take 200 mg by  mouth at bedtime.    [provider]  levonorgestrel  (MIRENA , 52 MG,) 20 MCG/DAY IUD 1 each by Intrauterine route once. Every 8 Years, Placed 2021    [provider]  levothyroxine  (SYNTHROID ) 25 MCG tablet 1 tab daily p.o. on an empty stomach x6 days a week, 2 tabs p.o. 1 day a week. 03/14/22   Kuneff, Renee A, DO  lithium  300 MG tablet Take 750 mg by mouth at bedtime.    [provider]  naproxen  (NAPROSYN ) 500 MG tablet Take 1 tablet with 1 tablet of sumatriptan  if you wake up with migraine.  May repeat once after 2 hours if needed. 07/23/23   Skeet Juliene SAUNDERS, DO  primidone  (MYSOLINE ) 50 MG tablet Take 0.5 tablets (25 mg total) by mouth at bedtime. 03/09/24   Skeet Juliene SAUNDERS, DO  sertraline  (ZOLOFT ) 100 MG tablet Take 150 mg by mouth at bedtime.    [provider]  SUMAtriptan  (IMITREX ) 100 MG tablet Take 1 tablet earliest onset of migraine.  May repeat in 2 hours. Maximum 2 tablets in 24 hours. 07/23/23   Skeet Juliene SAUNDERS, DO  traZODone  (DESYREL ) 100 MG tablet Take 100 mg by mouth at bedtime. 09/13/21   [provider]  ZEPBOUND 12.5 MG/0.5ML Pen Inject 12.5 mg into the skin once a week. 01/26/24   [provider]  Allergies: Almond (diagnostic), Almond oil, Codeine , and Hydrocodone     Review of Systems  Updated Vital Signs BP 108/66   Pulse 71   Temp 97.9 F (36.6 C)   Resp 20   SpO2 100%   Physical Exam Vitals and nursing note reviewed.  Constitutional:      General: She is not in acute distress.    Appearance: She is well-developed. She is not diaphoretic.  HENT:     Head: Normocephalic and atraumatic.  Eyes:     Pupils: Pupils are equal, round, and reactive to light.  Cardiovascular:     Rate and Rhythm: Normal rate and regular rhythm.     Heart sounds: No murmur heard.    No friction rub. No gallop.  Pulmonary:     Effort: Pulmonary effort is normal.     Breath sounds: No wheezing or rales.  Abdominal:     General: There is  no distension.     Palpations: Abdomen is soft.     Tenderness: There is no abdominal tenderness.  Musculoskeletal:        General: No tenderness.     Cervical back: Normal range of motion and neck supple.     Comments: Pulse motor and sensation intact in bilateral lower extremities.  Patient's reflexes are diminished maybe +1 bilaterally.  Maybe a beat or 2 of clonus to the right lower extremity.  Skin:    General: Skin is warm and dry.  Neurological:     Mental Status: She is alert and oriented to person, place, and time.  Psychiatric:        Behavior: Behavior normal.     (all labs ordered are listed, but only abnormal results are displayed) Labs Reviewed  CBC WITH DIFFERENTIAL/PLATELET - Abnormal; Notable for the following components:      Result Value   RBC 3.81 (*)    Hemoglobin 11.8 (*)    All other components within normal limits  COMPREHENSIVE METABOLIC PANEL WITH GFR  C-REACTIVE PROTEIN  SEDIMENTATION RATE    EKG: None  Radiology: No results found.   Procedures   Medications Ordered in the ED - No data to display                                  Medical Decision Making Amount and/or Complexity of Data Reviewed Labs: ordered. Radiology: ordered.   48 yo F with a chief complaints of bowel incontinence.  Patient unfortunately has chronic low back pain.  Sees a spinal surgeon for this.  Has had a fairly recent myelogram and then had a spinal injection about 4 weeks ago.  She had transient urinary incontinence and was told if she had any similar symptoms that she should come to the emergency department for urgent MRI.  No MRI available here tonight.  Patient also with a spinal cord stimulator I think would only be able to get MRI at [facility].  I discussed the case with Dr. Melvenia accepts the patient in ED to ED transfer.  The patients results and plan were reviewed and discussed.   Any x-rays performed were independently reviewed by myself.   Differential  diagnosis were considered with the presenting HPI.  Medications - No data to display  Vitals:   06/09/24 2127  BP: 108/66  Pulse: 71  Resp: 20  Temp: 97.9 F (36.6 C)  SpO2: 100%    Final diagnoses:  Incontinence of  feces, unspecified fecal incontinence type         Final diagnoses:  Incontinence of feces, unspecified fecal incontinence type    ED Discharge Orders     None          Emil Share, DO 06/09/24 2317

## 2024-06-09 NOTE — ED Notes (Addendum)
 Pt arrives POV from [facility]. Vitals updated upon arrival.

## 2024-06-09 NOTE — ED Triage Notes (Signed)
 Pt c/o bilateral injections L4 injections approx 22mo ago, fecal/ urinary incontinence since. Advises issues have waxed & waned; Ortho advised me that if it continued, he recommended stat MRI to make sure nothing is pressing on spinal cord. Endorses numbness/ tingling down R leg relieved w gabapentin , intermittent same on L side.

## 2024-06-09 NOTE — ED Notes (Signed)
 Pt advises urinary incontinence is similar to stress incontinence, but sometimes it just happens & I don't even feel like I need to pee beforehand. States that w stool incontinence, I don't even realize it's happened, then I go to the bathroom & there's some in my pants.

## 2024-06-10 ENCOUNTER — Emergency Department (HOSPITAL_COMMUNITY)

## 2024-06-10 ENCOUNTER — Emergency Department (HOSPITAL_COMMUNITY)
Admission: EM | Admit: 2024-06-10 | Discharge: 2024-06-10 | Disposition: A | Discharge status: Home / Self Care | Source: Ambulatory Visit

## 2024-06-10 ENCOUNTER — Encounter (HOSPITAL_COMMUNITY): Payer: Self-pay

## 2024-06-10 ENCOUNTER — Other Ambulatory Visit: Payer: Self-pay

## 2024-06-10 DIAGNOSIS — M545 Low back pain, unspecified: Secondary | ICD-10-CM | POA: Insufficient documentation

## 2024-06-10 DIAGNOSIS — R32 Unspecified urinary incontinence: Secondary | ICD-10-CM

## 2024-06-10 DIAGNOSIS — R159 Full incontinence of feces: Secondary | ICD-10-CM

## 2024-06-10 DIAGNOSIS — R200 Anesthesia of skin: Secondary | ICD-10-CM | POA: Insufficient documentation

## 2024-06-10 DIAGNOSIS — R531 Weakness: Secondary | ICD-10-CM | POA: Insufficient documentation

## 2024-06-10 LAB — C-REACTIVE PROTEIN: CRP: 0.8 mg/dL (ref <1.0)

## 2024-06-10 LAB — SEDIMENTATION RATE: Sed Rate: 18 mm/h (ref 0–22)

## 2024-06-10 NOTE — ED Triage Notes (Signed)
 Pt to ED requesting a MRI, Pt was suppose to have MRI last night/early this morning, but left prior to procedure, pt continues to c/o lower back pain and urinary and bowel incontinence Ambulatory in triage.

## 2024-06-10 NOTE — ED Provider Triage Note (Cosign Needed Addendum)
 Emergency Medicine Provider Triage Evaluation Note  Sheila Mathews , a 48 y.o. female  was evaluated in triage.  Pt complains of Injection to L4 area, now having intermittent incontinence of urine and stool. Told by Donaciano Sprang for MRI. Last surgery last July, injection last month. Numbness and weakness of right leg, worse than usual.  ADDENDUM: Dr. Sprang in the ED to see the patient. MRI cancelled at his request. CT lumbar w/o CM ordered. CT contacted as Dr. Sprang advises the study is high priority.   Review of Systems  Positive:  Negative:   Physical Exam  BP 105/69 (BP Location: Right Arm)   Pulse 74   Temp 97.8 F (36.6 C)   Resp 16   SpO2 97%  Gen:   Awake, no distress   Resp:  Normal effort  MSK:   Moves extremities without difficulty  Other:    Medical Decision Making  Medically screening exam initiated at 12:59 PM.  Appropriate orders placed.  Sheila Mathews was informed that the remainder of the evaluation will be completed by another provider, this initial triage assessment does not replace that evaluation, and the importance of remaining in the ED until their evaluation is complete.  3:30 - ADDENDUM: attempts made to review and discuss this patient's results with Dr. Sprang. Dr. Cherylene returned page with report We'll take care of it.  Patient will be moved to a bed for full exam and appropriate dispostition.   Odell Balls, PA-C 06/10/24 1325    Odell Balls, PA-C 06/10/24 1623

## 2024-06-10 NOTE — H&P (Signed)
 Chief Complaint: Fecal incontinence  History: Sheila Mathews is a very pleasant 48 year old woman with a 24 to 36-hour history of fecal incontinence.  Patient had a remote history of a spinal fusion several years ago and her most recent treatment was a L4-5 transforaminal epidural steroid injection.  The patient noted that following that injection she had a 1 week episode of incontinence of urine but it resolved.  She has had incontinence of stool in the past which resolved spontaneously.   Patient contacted my office with the episode of incontinence and was instructed to go to the emergency room for further evaluation and imaging to ensure she did not have a cauda equina syndrome.  Past Medical History:  Diagnosis Date   Abnormal cervical Papanicolaou smear 04/20/2020   Allergies    Anemia    with pregnancy , resolved after delivery   Anxiety    Asthma 11/05/2022   mild   Bipolar disorder (HCC)    Chicken pox    Chronic sinusitis 07/26/2016   no current problems as of 12/05/22   Complication of anesthesia    pt doesn't like mask she has panic attack; childhood, no problems as an adult   Depression    Dyspnea    with intermittent chest tightness at times, has used daughter's albuterol  inhaler which has helped per patient 12/05/22   Family history of adverse reaction to anesthesia    Mother PONV   GERD (gastroesophageal reflux disease)    Hypothyroidism    Lung nodule 01/2023   pulmonary adenoma   Migraines    Nonsmoker    Pelvic pain in female 05/13/2014   resolved, no current problem as of 12/05/22   Pneumonia    x 1   PONV (postoperative nausea and vomiting)    no issues with short procdures, had one isntance of vomitus following a longer procedure    Sciatica     Allergies  Allergen Reactions   Almond (Diagnostic) Anaphylaxis   Almond Oil Anaphylaxis and Other (See Comments)   Codeine  Other (See Comments)    Makes patient aggressive     Hydrocodone  Other (See Comments)     Makes patient aggressive     No current facility-administered medications on file prior to encounter.   Current Outpatient Medications on File Prior to Encounter  Medication Sig Dispense Refill   albuterol  (VENTOLIN  HFA) 108 (90 Base) MCG/ACT inhaler Inhale 2 puffs into the lungs every 6 (six) hours as needed for wheezing or shortness of breath. 8.5 each 1   Atogepant  (QULIPTA ) 60 MG TABS Take 1 tablet (60 mg total) by mouth daily. 30 tablet 5   clonazePAM  (KLONOPIN ) 0.5 MG tablet Take 0.5 mg by mouth 3 (three) times daily as needed for anxiety.     esomeprazole (NEXIUM) 20 MG capsule Take 20 mg by mouth daily.     ferrous sulfate  325 (65 FE) MG tablet Take 325 mg by mouth daily with breakfast.     fluticasone  (FLONASE ) 50 MCG/ACT nasal spray Place 2 sprays into both nostrils daily.     gabapentin  (NEURONTIN ) 300 MG capsule Take 300 mg by mouth 3 (three) times daily.     lamoTRIgine  (LAMICTAL ) 200 MG tablet Take 200 mg by mouth at bedtime.     levonorgestrel  (MIRENA , 52 MG,) 20 MCG/DAY IUD 1 each by Intrauterine route once. Every 8 Years, Placed 2021     levothyroxine  (SYNTHROID ) 25 MCG tablet 1 tab daily p.o. on an empty stomach x6 days  a week, 2 tabs p.o. 1 day a week. 102 tablet 3   lithium  300 MG tablet Take 750 mg by mouth at bedtime.     naproxen  (NAPROSYN ) 500 MG tablet Take 1 tablet with 1 tablet of sumatriptan  if you wake up with migraine.  May repeat once after 2 hours if needed. 20 tablet 5   primidone  (MYSOLINE ) 50 MG tablet Take 0.5 tablets (25 mg total) by mouth at bedtime. 15 tablet 5   sertraline  (ZOLOFT ) 100 MG tablet Take 150 mg by mouth at bedtime.     SUMAtriptan  (IMITREX ) 100 MG tablet Take 1 tablet earliest onset of migraine.  May repeat in 2 hours. Maximum 2 tablets in 24 hours. 10 tablet 11   traZODone  (DESYREL ) 100 MG tablet Take 100 mg by mouth at bedtime.     ZEPBOUND 12.5 MG/0.5ML Pen Inject 12.5 mg into the skin once a week.      Physical Exam: Vitals:    06/10/24 1133  BP: 105/69  Pulse: 74  Resp: 16  Temp: 97.8 F (36.6 C)  SpO2: 97%   Patient is alert and oriented x 3 No shortness of breath or chest pain. Abdomen is soft and nontender.  No rebound tenderness, no mass is palpable. Neurological exam: 5/5 motor strength in the lower extremity bilaterally.  Negative Babinski test, no clonus, intact sensation light touch throughout the lower extremity.  Intact sensation to sharp and dull in the perianal region, normal rectal tone on examination.  Negative nerve root tension signs in the lower extremity 2+ dorsalis pedis/posterior tibialis pulses bilaterally.  Compartments are soft and nontender in the lower extremity. Normal gait pattern.  Negative Romberg's test. No pain with passive/active range of motion of the hip, knee, ankle.  A/P: Sheila Mathews is a very pleasant 48 year old woman who is well-known to my practice.  She underwent a lumbar spinal fusion several years ago and has had recent increasing pain and loss in quality of life.  She was last seen by me on 05/26/2024.  At that time she had had a transforaminal epidural steroid injection which did not provide any significant relief.  She noted that she had a 1 week history of urinary incontinence following that but it resolved.  The patient contacted my office yesterday with a 1 day history of incontinence of bowel.  At that time she was instructed to go to the emergency room so that advanced imaging could be done and determine if she has a cauda equina syndrome.  The patient was seen at [facility] at approximately 11 PM on 06/10/2024.  She was then instructed to transfer to the [facility] for further imaging.  Because of her spinal cord stimulator battery there was some question whether or not an MRI could be done.  The patient arrived to the [facility] emergency room waiting area at 12:57 AM but then signed out AMA before being evaluated.  She returned today at approximately 11:30 AM for  reevaluation.  After reviewing her brief medical history I went to the emergency room to evaluate the patient myself.  On clinical exam she has intact sphincter tone and she has intact perianal sensation.  She has no focal neurological deficits but she is having her chronic back pain.  Based on her clinical exam the patient does not have classic signs of a cauda equina syndrome.  Her rectal exam is normal as is her perianal sensation.  Furthermore she has no focal neurological deficits in the lower extremity  nor does she have any signs or symptoms of lumbar radiculopathy.  At this point I will obtain CT scan of the lumbar spine to rule out any mass effect in the cauda equina or conus.  If the CT scan is unremarkable then I would recommend a GI consultation for the fecal incontinence.    Addendum: CT scan of the lumbar spine was reviewed.  I do not see any significant compressive lesion that would be consistent with a cauda equina or conus injury.  There is no significant stenosis or neural compression that would warrant surgical intervention at this time.  Recommend if she is still having incontinence of bowel to see a GI specialist.   Patient will follow-up with me as an outpatient to discuss her chronic back pain.

## 2024-06-10 NOTE — ED Provider Notes (Signed)
 [place] EMERGENCY DEPARTMENT AT [facility] Provider Note   CSN: [id number] Arrival date & time: 06/10/24  1128     Patient presents with: Back Pain   Sheila Mathews is a 48 y.o. female.   48 y.o. female  was initially evaluated in triage.  Pt reports history of injection to L4 area, now having intermittent incontinence of urine and stool. Told by Donaciano Sprang to come to ED for evaluation for cauda equina. Per patient, last surgery last July, injection last month. Numbness and weakness of right leg, worse than usual.  The history is provided by the patient. No language interpreter was used.  Back Pain      Prior to Admission medications  Medication Sig Start Date End Date Taking? Authorizing Provider  albuterol  (VENTOLIN  HFA) 108 (90 Base) MCG/ACT inhaler Inhale 2 puffs into the lungs every 6 (six) hours as needed for wheezing or shortness of breath. 01/23/24   Ruthell Lauraine FALCON, NP  Atogepant  (QULIPTA ) 60 MG TABS Take 1 tablet (60 mg total) by mouth daily. 03/09/24   Skeet Juliene SAUNDERS, DO  clonazePAM  (KLONOPIN ) 0.5 MG tablet Take 0.5 mg by mouth 3 (three) times daily as needed for anxiety.    [provider]  esomeprazole (NEXIUM) 20 MG capsule Take 20 mg by mouth daily.    [provider]  ferrous sulfate  325 (65 FE) MG tablet Take 325 mg by mouth daily with breakfast.    [provider]  fluticasone  (FLONASE ) 50 MCG/ACT nasal spray Place 2 sprays into both nostrils daily.    [provider]  gabapentin  (NEURONTIN ) 300 MG capsule Take 300 mg by mouth 3 (three) times daily. 05/16/22   [provider]  lamoTRIgine  (LAMICTAL ) 200 MG tablet Take 200 mg by mouth at bedtime.    [provider]  levonorgestrel  (MIRENA , 52 MG,) 20 MCG/DAY IUD 1 each by Intrauterine route once. Every 8 Years, Placed 2021    [provider]  levothyroxine  (SYNTHROID ) 25 MCG tablet 1 tab daily p.o. on an empty stomach x6 days a week, 2 tabs  p.o. 1 day a week. 03/14/22   Kuneff, Renee A, DO  lithium  300 MG tablet Take 750 mg by mouth at bedtime.    [provider]  naproxen  (NAPROSYN ) 500 MG tablet Take 1 tablet with 1 tablet of sumatriptan  if you wake up with migraine.  May repeat once after 2 hours if needed. 07/23/23   Skeet Juliene SAUNDERS, DO  primidone  (MYSOLINE ) 50 MG tablet Take 0.5 tablets (25 mg total) by mouth at bedtime. 03/09/24   Skeet Juliene SAUNDERS, DO  sertraline  (ZOLOFT ) 100 MG tablet Take 150 mg by mouth at bedtime.    [provider]  SUMAtriptan  (IMITREX ) 100 MG tablet Take 1 tablet earliest onset of migraine.  May repeat in 2 hours. Maximum 2 tablets in 24 hours. 07/23/23   Skeet Juliene SAUNDERS, DO  traZODone  (DESYREL ) 100 MG tablet Take 100 mg by mouth at bedtime. 09/13/21   [provider]  ZEPBOUND 12.5 MG/0.5ML Pen Inject 12.5 mg into the skin once a week. 01/26/24   [provider]    Allergies: Almond (diagnostic), Almond oil, Codeine , and Hydrocodone     Review of Systems  Musculoskeletal:  Positive for back pain.    Updated Vital Signs BP (!) 96/51 (BP Location: Right Arm)   Pulse (!) 59   Temp 98.3 F (36.8 C)   Resp 16   SpO2 99%   Physical  Exam Vitals and nursing note reviewed.  Constitutional:      General: She is not in acute distress.    Appearance: She is well-developed. She is not ill-appearing.  HENT:     Mouth/Throat:     Mouth: Mucous membranes are moist.  Cardiovascular:     Rate and Rhythm: Normal rate.  Pulmonary:     Effort: Pulmonary effort is normal.  Abdominal:     General: There is no distension.  Musculoskeletal:        General: Normal range of motion.     Cervical back: Normal range of motion.  Skin:    General: Skin is warm and dry.  Neurological:     Mental Status: She is alert and oriented to person, place, and time.     GCS: GCS eye subscore is 4. GCS verbal subscore is 5. GCS motor subscore is 6.     Cranial Nerves: No dysarthria or facial  asymmetry.     Motor: No abnormal muscle tone.     Coordination: Coordination normal.     Gait: Gait normal.     (all labs ordered are listed, but only abnormal results are displayed) Labs Reviewed - No data to display  EKG: None  Radiology: MR LUMBAR SPINE WO CONTRAST Result Date: 06/10/2024 EXAM: MRI LUMBAR SPINE 06/10/2024 06:08:51 PM TECHNIQUE: Multiplanar multisequence MRI of the lumbar spine was performed without the administration of intravenous contrast. COMPARISON: None available. CLINICAL HISTORY: Low back pain, cauda equina syndrome suspected FINDINGS: BONES AND ALIGNMENT: Normal alignment. Normal vertebral body heights. Bone marrow signal is unremarkable. SPINAL CORD: The conus terminates normally. SOFT TISSUES: No paraspinal mass. L1-L2: No significant disc herniation. No spinal canal stenosis or neural foraminal narrowing. L2-L3: No significant disc herniation. No spinal canal stenosis or neural foraminal narrowing. L3-L4: Disc height loss and desiccation. Broad disc bulge with small superimposed central disc. protrusion. Borderline mild bilateral foraminal and canal stenosis L4-L5: PLIF. No significant disc herniation. No spinal canal stenosis or neural foraminal narrowing. L5-S1: No significant disc herniation. No spinal canal stenosis or neural foraminal narrowing. IMPRESSION: 1. Borderline mild bilateral foraminal and canal stenosis at L3-L4. No impingement. 2. L4-L5 PLIF. Electronically signed by: Gilmore Molt 06/10/2024 07:32 PM EST RP Workstation: [id number]   CT LUMBAR SPINE WO CONTRAST Result Date: 06/10/2024 EXAM: CT Lumbar Spine Without Intravenous Contrast CLINICAL HISTORY: Low back pain, cauda equina syndrome suspected. TECHNIQUE: Axial computed tomography images of the lumbar spine without intravenous contrast. Sagittal and coronal reformations performed. Dose reduction technique was used including one or more of the following: automated exposure control, adjustment  of mA and kV according to patient size, and/or iterative reconstruction. COMPARISON: CT lumbar spine myelogram 03/31/2024. FINDINGS: BONES: Transitional lumbosacral anatomy: The S1 vertebral body is a transitional segment which is partially lumbarized. The lowest well-formed disc space is S1-S2, concordant with numbering on prior studies. Redemonstrated posterior instrumented fusion at L5-S1 with solid osseous fusion across the L5-S1 disc space. Hardware is intact. Similar degenerative retrolisthesis of L4 on L5. Lumbar lordosis is maintained. Similar surgical hardware traversing the left sacroiliac joint. Partial assimilation of the left transverse process of S1 with the superior sacrum. No evidence of fracture. Mild degenerative endplate irregularity at L4-L5. DISCS / DEGENERATIVE CHANGES: At L4-L5: Disc bulge along with facet arthrosis with mild spinal canal stenosis. There is mild bilateral foraminal stenosis. At L5-S1: Streak artifact limits evaluation of the spinal canal. No significant disc or facet degeneration at other visualized levels. SOFT TISSUES: Partially  visualized spinal stimulator leads in the left paraspinal musculature coursing into the lower thoracic spine and out of the field of view. Paraspinal soft tissues are unremarkable. No evidence of paraspinal fluid collection. No prevertebral soft tissue swelling. INCIDENTAL FINDINGS: IUD within the uterus. IMPRESSION: 1. No evidence of paraspinal fluid collection on non-contrast CT. 2. Disc bulge at L4-L5 with facet arthrosis resulting in mild spinal canal and mild bilateral foraminal stenosis, similar to prior myelogram. 3. Posterior instrumented fusion at L5-S1 with solid osseous fusion across the L5-S1 disc space. Hardware intact. 4. Streak artifact limits evaluation of spinal canal at L5-S1. 5. Consider MRI lumbar spine or repeat CT myelogram for further evaluation of radicular symptoms. Electronically signed by: Donnice Mania MD 06/10/2024 02:50  PM EST RP Workstation: [id number]     Procedures   Medications Ordered in the ED - No data to display  Clinical Course as of 06/10/24 1953  Thu Jun 10, 2024  1517 CT w/o CM lumbar per radiology:  IMPRESSION: 1. No evidence of paraspinal fluid collection on non-contrast CT. 2. Disc bulge at L4-L5 with facet arthrosis resulting in mild spinal canal and mild bilateral foraminal stenosis, similar to prior myelogram. 3. Posterior instrumented fusion at L5-S1 with solid osseous fusion across the L5-S1 disc space. Hardware intact. 4. Streak artifact limits evaluation of spinal canal at L5-S1. 5. Consider MRI lumbar spine or repeat CT myelogram for further evaluation of radicular symptoms.  [SU]  1641 No further contact or consult from ortho. I reviewed her presentation with ED attending, Dr. Ula, and discussed options with the patient - specifically that the CT looks like previous, however, MRI is the definitive test for standard of care and evaluation for cauda equina. She wishes to proceed with attempt at getting the MRI with known stimulator in place. Per MRI, if she has the 'card' and her remote has MRI mode, it may be possible and will be evaluated for safety in the MRI department.  [SU]  1735 Received a call from Dr. Burnetta' RN who advised that he reviewed the CT and does not feel any acute change is present, and has little concern for cauda equina. Appreciate their input.  [SU]  1951 MRI Lumbar spine: IMPRESSION: 1. Borderline mild bilateral foraminal and canal stenosis at L3-L4. No impingement. 2. L4-L5 PLIF.  Patient continues to be in NAD. She has been observed going to the bathroom multiple times and confirms that for now she has complete bladder and bowel control.   Stable for discharge. Discussed follow up with GI (hers is Belvie Just) and with her uro-GYN for further investigation of intermittent incontinence issues.  [SU]    Clinical Course User Index [SU] Odell Balls, PA-C                                 Medical Decision Making Amount and/or Complexity of Data Reviewed Radiology: ordered.        Final diagnoses:  Urinary incontinence, unspecified type  Incontinence of feces, unspecified fecal incontinence type    ED Discharge Orders     None          Odell Balls, DEVONNA 06/10/24 1953    Cottie Donnice PARAS, MD 06/12/24 1622

## 2024-06-10 NOTE — ED Notes (Signed)
 Pt left [facility] d/t wait time. Pt reassured MRI would be coming to get her soon for her scan but pt was insistent on leaving.

## 2024-06-10 NOTE — Discharge Instructions (Signed)
 As we discussed, your MRI does not show any compromise of your spinal cord that would cause incontinence issues. Follow up with Dr. Rollin, GI, and with your uro-gynecologist for further outpatient evaluation.   Return to the ED as needed. Thank you for your patience today. It is very much appreciated.

## 2024-06-10 NOTE — Hospital Course (Addendum)
 Chief Complaint: Fecal incontinence  History: Sheila Mathews is a very pleasant 48 year old woman with a 24 to 36-hour history of fecal incontinence.  Patient had a remote history of a spinal fusion several years ago and her most recent treatment was a L4-5 transforaminal epidural steroid injection.  The patient noted that following that injection she had a 1 week episode of incontinence of urine but it resolved.  She has had incontinence of stool in the past which resolved spontaneously.   Patient contacted my office with the episode of incontinence and was instructed to go to the emergency room for further evaluation and imaging to ensure she did not have a cauda equina syndrome.  Past Medical History:  Diagnosis Date   Abnormal cervical Papanicolaou smear 04/20/2020   Allergies    Anemia    with pregnancy , resolved after delivery   Anxiety    Asthma 11/05/2022   mild   Bipolar disorder (HCC)    Chicken pox    Chronic sinusitis 07/26/2016   no current problems as of 12/05/22   Complication of anesthesia    pt doesn't like mask she has panic attack; childhood, no problems as an adult   Depression    Dyspnea    with intermittent chest tightness at times, has used daughter's albuterol  inhaler which has helped per patient 12/05/22   Family history of adverse reaction to anesthesia    Mother PONV   GERD (gastroesophageal reflux disease)    Hypothyroidism    Lung nodule 01/2023   pulmonary adenoma   Migraines    Nonsmoker    Pelvic pain in female 05/13/2014   resolved, no current problem as of 12/05/22   Pneumonia    x 1   PONV (postoperative nausea and vomiting)    no issues with short procdures, had one isntance of vomitus following a longer procedure    Sciatica     Allergies  Allergen Reactions   Almond (Diagnostic) Anaphylaxis   Almond Oil Anaphylaxis and Other (See Comments)   Codeine  Other (See Comments)    Makes patient aggressive     Hydrocodone  Other (See Comments)     Makes patient aggressive     No current facility-administered medications on file prior to encounter.   Current Outpatient Medications on File Prior to Encounter  Medication Sig Dispense Refill   albuterol  (VENTOLIN  HFA) 108 (90 Base) MCG/ACT inhaler Inhale 2 puffs into the lungs every 6 (six) hours as needed for wheezing or shortness of breath. 8.5 each 1   Atogepant  (QULIPTA ) 60 MG TABS Take 1 tablet (60 mg total) by mouth daily. 30 tablet 5   clonazePAM  (KLONOPIN ) 0.5 MG tablet Take 0.5 mg by mouth 3 (three) times daily as needed for anxiety.     esomeprazole (NEXIUM) 20 MG capsule Take 20 mg by mouth daily.     ferrous sulfate  325 (65 FE) MG tablet Take 325 mg by mouth daily with breakfast.     fluticasone  (FLONASE ) 50 MCG/ACT nasal spray Place 2 sprays into both nostrils daily.     gabapentin  (NEURONTIN ) 300 MG capsule Take 300 mg by mouth 3 (three) times daily.     lamoTRIgine  (LAMICTAL ) 200 MG tablet Take 200 mg by mouth at bedtime.     levonorgestrel  (MIRENA , 52 MG,) 20 MCG/DAY IUD 1 each by Intrauterine route once. Every 8 Years, Placed 2021     levothyroxine  (SYNTHROID ) 25 MCG tablet 1 tab daily p.o. on an empty stomach x6 days  a week, 2 tabs p.o. 1 day a week. 102 tablet 3   lithium  300 MG tablet Take 750 mg by mouth at bedtime.     naproxen  (NAPROSYN ) 500 MG tablet Take 1 tablet with 1 tablet of sumatriptan  if you wake up with migraine.  May repeat once after 2 hours if needed. 20 tablet 5   primidone  (MYSOLINE ) 50 MG tablet Take 0.5 tablets (25 mg total) by mouth at bedtime. 15 tablet 5   sertraline  (ZOLOFT ) 100 MG tablet Take 150 mg by mouth at bedtime.     SUMAtriptan  (IMITREX ) 100 MG tablet Take 1 tablet earliest onset of migraine.  May repeat in 2 hours. Maximum 2 tablets in 24 hours. 10 tablet 11   traZODone  (DESYREL ) 100 MG tablet Take 100 mg by mouth at bedtime.     ZEPBOUND 12.5 MG/0.5ML Pen Inject 12.5 mg into the skin once a week.      Physical Exam: Vitals:    06/10/24 1133  BP: 105/69  Pulse: 74  Resp: 16  Temp: 97.8 F (36.6 C)  SpO2: 97%   Patient is alert and oriented x 3 No shortness of breath or chest pain. Abdomen is soft and nontender.  No rebound tenderness, no mass is palpable. Neurological exam: 5/5 motor strength in the lower extremity bilaterally.  Negative Babinski test, no clonus, intact sensation light touch throughout the lower extremity.  Intact sensation to sharp and dull in the perianal region, normal rectal tone on examination.  Negative nerve root tension signs in the lower extremity 2+ dorsalis pedis/posterior tibialis pulses bilaterally.  Compartments are soft and nontender in the lower extremity. Normal gait pattern.  Negative Romberg's test. No pain with passive/active range of motion of the hip, knee, ankle.  A/P: [name] is a very pleasant 48 year old woman who is well-known to my practice.  She underwent a lumbar spinal fusion several years ago and has had recent increasing pain and loss in quality of life.  She was last seen by me on 05/26/2024.  At that time she had had a transforaminal epidural steroid injection which did not provide any significant relief.  She noted that she had a 1 week history of urinary incontinence following that but it resolved.  The patient contacted my office yesterday with a 1 day history of incontinence of bowel.  At that time she was instructed to go to the emergency room so that advanced imaging could be done and determine if she has a cauda equina syndrome.  The patient was seen at [facility] at approximately 11 PM on 06/10/2024.  She was then instructed to transfer to the [facility] for further imaging.  Because of her spinal cord stimulator battery there was some question whether or not an MRI could be done.  The patient arrived to the [facility] emergency room waiting area at 12:57 AM but then signed out AMA before being evaluated.  She returned today at approximately 11:30 AM for  reevaluation.  After reviewing her brief medical history I went to the emergency room to evaluate the patient myself.  On clinical exam she has intact sphincter tone and she has intact perianal sensation.  She has no focal neurological deficits but she is having her chronic back pain.  Based on her clinical exam the patient does not have classic signs of a cauda equina syndrome.  Her rectal exam is normal as is her perianal sensation.  Furthermore she has no focal neurological deficits in the lower extremity  nor does she have any signs or symptoms of lumbar radiculopathy.  At this point I will obtain CT scan of the lumbar spine to rule out any mass effect in the cauda equina or conus.  If the CT scan is unremarkable then I would recommend a GI consultation for the fecal incontinence.

## 2024-07-20 ENCOUNTER — Other Ambulatory Visit: Payer: Self-pay | Admitting: Neurology

## 2024-07-25 ENCOUNTER — Encounter: Payer: Self-pay | Admitting: Neurology

## 2024-08-02 ENCOUNTER — Ambulatory Visit: Admitting: Neurology

## 2024-08-05 ENCOUNTER — Other Ambulatory Visit (HOSPITAL_COMMUNITY): Payer: Self-pay

## 2024-08-05 ENCOUNTER — Telehealth: Payer: Self-pay | Admitting: Pharmacy Technician

## 2024-08-05 NOTE — Telephone Encounter (Signed)
 Pharmacy Patient Advocate Encounter   Received notification from Patient Advice Request messages that prior authorization for QULIPTA  60MG  is required/requested.   Insurance verification completed.   The patient is insured through [employer].   Per test claim: PA required; PA submitted to above mentioned insurance via Latent Key/confirmation #/[id number] Status is pending

## 2024-08-05 NOTE — Telephone Encounter (Signed)
 PA has been submitted, and telephone encounter has been created. Please see telephone encounter dated 2.5.26.

## 2024-08-06 ENCOUNTER — Other Ambulatory Visit (HOSPITAL_COMMUNITY): Payer: Self-pay

## 2024-08-10 ENCOUNTER — Ambulatory Visit: Admitting: Acute Care

## 2024-08-19 ENCOUNTER — Ambulatory Visit: Admitting: Neurology
